# Patient Record
Sex: Male | Born: 1960
Health system: Southern US, Community
[De-identification: ages and names within clinical notes are randomized; demographics above are authoritative.]

## PROBLEM LIST (undated history)

## (undated) DIAGNOSIS — M1990 Unspecified osteoarthritis, unspecified site: Secondary | ICD-10-CM

## (undated) DIAGNOSIS — Z87891 Personal history of nicotine dependence: Secondary | ICD-10-CM

## (undated) DIAGNOSIS — R079 Chest pain, unspecified: Secondary | ICD-10-CM

## (undated) DIAGNOSIS — G8929 Other chronic pain: Secondary | ICD-10-CM

## (undated) DIAGNOSIS — G40909 Epilepsy, unspecified, not intractable, without status epilepticus: Secondary | ICD-10-CM

## (undated) DIAGNOSIS — M87059 Idiopathic aseptic necrosis of unspecified femur: Secondary | ICD-10-CM

## (undated) DIAGNOSIS — F1010 Alcohol abuse, uncomplicated: Secondary | ICD-10-CM

## (undated) DIAGNOSIS — R569 Unspecified convulsions: Secondary | ICD-10-CM

## (undated) DIAGNOSIS — J929 Pleural plaque without asbestos: Secondary | ICD-10-CM

## (undated) HISTORY — DX: Chest pain, unspecified: R07.9

## (undated) HISTORY — PX: OTHER SURGICAL HISTORY: SHX169

## (undated) HISTORY — DX: Idiopathic aseptic necrosis of unspecified femur: M87.059

## (undated) HISTORY — DX: Alcohol abuse, uncomplicated: F10.10

## (undated) HISTORY — DX: Pleural plaque without asbestos: J92.9

## (undated) HISTORY — DX: Epilepsy, unspecified, not intractable, without status epilepticus: G40.909

## (undated) HISTORY — DX: Personal history of nicotine dependence: Z87.891

## (undated) HISTORY — DX: Unspecified convulsions: R56.9

## (undated) HISTORY — DX: Unspecified osteoarthritis, unspecified site: M19.90

## (undated) HISTORY — PX: POLYPECTOMY: SHX149

## (undated) HISTORY — DX: Other chronic pain: G89.29

---

## 1997-09-17 ENCOUNTER — Encounter: Admission: RE | Admit: 1997-09-17 | Discharge: 1997-09-17 | Payer: Self-pay | Admitting: Hematology and Oncology

## 1998-05-29 ENCOUNTER — Encounter: Admission: RE | Admit: 1998-05-29 | Discharge: 1998-05-29 | Payer: Self-pay | Admitting: Hematology and Oncology

## 1998-12-09 ENCOUNTER — Encounter: Admission: RE | Admit: 1998-12-09 | Discharge: 1998-12-09 | Payer: Self-pay | Admitting: Internal Medicine

## 1999-05-28 ENCOUNTER — Encounter: Admission: RE | Admit: 1999-05-28 | Discharge: 1999-05-28 | Payer: Self-pay | Admitting: Hematology and Oncology

## 1999-12-17 ENCOUNTER — Encounter: Admission: RE | Admit: 1999-12-17 | Discharge: 1999-12-17 | Payer: Self-pay | Admitting: Hematology and Oncology

## 2000-05-16 ENCOUNTER — Encounter: Admission: RE | Admit: 2000-05-16 | Discharge: 2000-05-16 | Payer: Self-pay | Admitting: Internal Medicine

## 2000-11-01 ENCOUNTER — Encounter: Admission: RE | Admit: 2000-11-01 | Discharge: 2000-11-01 | Payer: Self-pay | Admitting: Internal Medicine

## 2000-11-10 ENCOUNTER — Encounter: Admission: RE | Admit: 2000-11-10 | Discharge: 2000-11-10 | Payer: Self-pay | Admitting: Internal Medicine

## 2001-02-06 ENCOUNTER — Emergency Department (HOSPITAL_COMMUNITY): Admission: EM | Admit: 2001-02-06 | Discharge: 2001-02-06 | Payer: Self-pay | Admitting: Emergency Medicine

## 2001-02-06 ENCOUNTER — Encounter: Payer: Self-pay | Admitting: *Deleted

## 2001-02-07 ENCOUNTER — Encounter: Admission: RE | Admit: 2001-02-07 | Discharge: 2001-02-07 | Payer: Self-pay

## 2001-02-20 ENCOUNTER — Ambulatory Visit (HOSPITAL_COMMUNITY): Admission: RE | Admit: 2001-02-20 | Discharge: 2001-02-20 | Payer: Self-pay | Admitting: Pulmonary Disease

## 2001-02-20 ENCOUNTER — Encounter: Payer: Self-pay | Admitting: Pulmonary Disease

## 2001-04-16 ENCOUNTER — Encounter: Payer: Self-pay | Admitting: Emergency Medicine

## 2001-04-16 ENCOUNTER — Emergency Department (HOSPITAL_COMMUNITY): Admission: EM | Admit: 2001-04-16 | Discharge: 2001-04-16 | Payer: Self-pay

## 2001-05-08 ENCOUNTER — Encounter: Admission: RE | Admit: 2001-05-08 | Discharge: 2001-05-08 | Payer: Self-pay | Admitting: *Deleted

## 2001-05-15 ENCOUNTER — Encounter: Admission: RE | Admit: 2001-05-15 | Discharge: 2001-05-15 | Payer: Self-pay

## 2001-06-07 ENCOUNTER — Inpatient Hospital Stay (HOSPITAL_COMMUNITY): Admission: EM | Admit: 2001-06-07 | Discharge: 2001-06-09 | Payer: Self-pay | Admitting: Emergency Medicine

## 2001-06-07 ENCOUNTER — Encounter: Payer: Self-pay | Admitting: Emergency Medicine

## 2001-06-12 ENCOUNTER — Encounter: Admission: RE | Admit: 2001-06-12 | Discharge: 2001-06-12 | Payer: Self-pay | Admitting: Internal Medicine

## 2001-07-26 ENCOUNTER — Encounter: Admission: RE | Admit: 2001-07-26 | Discharge: 2001-07-26 | Payer: Self-pay | Admitting: Internal Medicine

## 2001-07-27 ENCOUNTER — Ambulatory Visit (HOSPITAL_COMMUNITY): Admission: RE | Admit: 2001-07-27 | Discharge: 2001-07-27 | Payer: Self-pay | Admitting: *Deleted

## 2001-07-27 ENCOUNTER — Encounter: Payer: Self-pay | Admitting: *Deleted

## 2001-08-11 ENCOUNTER — Encounter: Admission: RE | Admit: 2001-08-11 | Discharge: 2001-08-11 | Payer: Self-pay | Admitting: Internal Medicine

## 2001-08-17 ENCOUNTER — Ambulatory Visit (HOSPITAL_COMMUNITY): Admission: RE | Admit: 2001-08-17 | Discharge: 2001-08-17 | Payer: Self-pay | Admitting: *Deleted

## 2001-08-18 ENCOUNTER — Encounter: Admission: RE | Admit: 2001-08-18 | Discharge: 2001-08-18 | Payer: Self-pay | Admitting: Internal Medicine

## 2001-10-04 ENCOUNTER — Encounter: Admission: RE | Admit: 2001-10-04 | Discharge: 2001-10-04 | Payer: Self-pay | Admitting: Internal Medicine

## 2001-10-13 ENCOUNTER — Encounter: Payer: Self-pay | Admitting: Emergency Medicine

## 2001-10-13 ENCOUNTER — Emergency Department (HOSPITAL_COMMUNITY): Admission: EM | Admit: 2001-10-13 | Discharge: 2001-10-13 | Payer: Self-pay | Admitting: Emergency Medicine

## 2001-11-27 ENCOUNTER — Encounter: Admission: RE | Admit: 2001-11-27 | Discharge: 2001-11-27 | Payer: Self-pay | Admitting: Internal Medicine

## 2002-01-15 ENCOUNTER — Emergency Department (HOSPITAL_COMMUNITY): Admission: EM | Admit: 2002-01-15 | Discharge: 2002-01-15 | Payer: Self-pay | Admitting: Emergency Medicine

## 2002-01-15 ENCOUNTER — Encounter: Payer: Self-pay | Admitting: Emergency Medicine

## 2002-03-15 ENCOUNTER — Encounter: Admission: RE | Admit: 2002-03-15 | Discharge: 2002-03-15 | Payer: Self-pay | Admitting: Internal Medicine

## 2002-03-21 ENCOUNTER — Ambulatory Visit (HOSPITAL_COMMUNITY): Admission: RE | Admit: 2002-03-21 | Discharge: 2002-03-21 | Payer: Self-pay | Admitting: Internal Medicine

## 2002-03-21 ENCOUNTER — Encounter: Payer: Self-pay | Admitting: Internal Medicine

## 2002-04-16 ENCOUNTER — Emergency Department (HOSPITAL_COMMUNITY): Admission: EM | Admit: 2002-04-16 | Discharge: 2002-04-16 | Payer: Self-pay | Admitting: Emergency Medicine

## 2002-06-01 ENCOUNTER — Encounter: Admission: RE | Admit: 2002-06-01 | Discharge: 2002-06-01 | Payer: Self-pay | Admitting: Internal Medicine

## 2002-08-17 ENCOUNTER — Encounter: Payer: Self-pay | Admitting: Internal Medicine

## 2002-08-17 ENCOUNTER — Encounter: Admission: RE | Admit: 2002-08-17 | Discharge: 2002-08-17 | Payer: Self-pay | Admitting: Internal Medicine

## 2002-08-17 ENCOUNTER — Ambulatory Visit (HOSPITAL_COMMUNITY): Admission: RE | Admit: 2002-08-17 | Discharge: 2002-08-17 | Payer: Self-pay | Admitting: Internal Medicine

## 2002-11-13 ENCOUNTER — Emergency Department (HOSPITAL_COMMUNITY): Admission: EM | Admit: 2002-11-13 | Discharge: 2002-11-13 | Payer: Self-pay | Admitting: Emergency Medicine

## 2002-11-13 ENCOUNTER — Encounter: Payer: Self-pay | Admitting: Emergency Medicine

## 2002-11-28 ENCOUNTER — Encounter: Admission: RE | Admit: 2002-11-28 | Discharge: 2002-11-28 | Payer: Self-pay | Admitting: Internal Medicine

## 2003-04-11 ENCOUNTER — Encounter: Admission: RE | Admit: 2003-04-11 | Discharge: 2003-04-11 | Payer: Self-pay | Admitting: Internal Medicine

## 2003-04-22 ENCOUNTER — Encounter: Admission: RE | Admit: 2003-04-22 | Discharge: 2003-04-22 | Payer: Self-pay | Admitting: Internal Medicine

## 2003-04-29 ENCOUNTER — Encounter: Admission: RE | Admit: 2003-04-29 | Discharge: 2003-04-29 | Payer: Self-pay | Admitting: Internal Medicine

## 2003-07-09 ENCOUNTER — Encounter: Admission: RE | Admit: 2003-07-09 | Discharge: 2003-07-09 | Payer: Self-pay | Admitting: Internal Medicine

## 2003-08-14 ENCOUNTER — Encounter: Admission: RE | Admit: 2003-08-14 | Discharge: 2003-08-14 | Payer: Self-pay | Admitting: Internal Medicine

## 2003-11-04 ENCOUNTER — Ambulatory Visit (HOSPITAL_COMMUNITY): Admission: RE | Admit: 2003-11-04 | Discharge: 2003-11-04 | Payer: Self-pay | Admitting: Internal Medicine

## 2003-11-04 ENCOUNTER — Encounter: Admission: RE | Admit: 2003-11-04 | Discharge: 2003-11-04 | Payer: Self-pay | Admitting: Internal Medicine

## 2004-05-28 ENCOUNTER — Ambulatory Visit (HOSPITAL_COMMUNITY): Admission: RE | Admit: 2004-05-28 | Discharge: 2004-05-28 | Payer: Self-pay | Admitting: Internal Medicine

## 2004-05-28 ENCOUNTER — Ambulatory Visit: Payer: Self-pay | Admitting: Internal Medicine

## 2004-06-08 ENCOUNTER — Ambulatory Visit: Payer: Self-pay | Admitting: Internal Medicine

## 2004-09-28 ENCOUNTER — Ambulatory Visit: Payer: Self-pay | Admitting: Internal Medicine

## 2005-03-08 DIAGNOSIS — M87059 Idiopathic aseptic necrosis of unspecified femur: Secondary | ICD-10-CM

## 2005-03-08 HISTORY — DX: Idiopathic aseptic necrosis of unspecified femur: M87.059

## 2005-06-15 ENCOUNTER — Ambulatory Visit: Payer: Self-pay | Admitting: Hospitalist

## 2005-10-12 ENCOUNTER — Inpatient Hospital Stay (HOSPITAL_COMMUNITY): Admission: RE | Admit: 2005-10-12 | Discharge: 2005-10-15 | Payer: Self-pay | Admitting: Orthopedic Surgery

## 2006-02-25 DIAGNOSIS — R079 Chest pain, unspecified: Secondary | ICD-10-CM | POA: Insufficient documentation

## 2006-02-25 DIAGNOSIS — R918 Other nonspecific abnormal finding of lung field: Secondary | ICD-10-CM | POA: Insufficient documentation

## 2006-02-25 DIAGNOSIS — R569 Unspecified convulsions: Secondary | ICD-10-CM | POA: Insufficient documentation

## 2006-02-25 DIAGNOSIS — M87051 Idiopathic aseptic necrosis of right femur: Secondary | ICD-10-CM | POA: Insufficient documentation

## 2006-03-22 ENCOUNTER — Ambulatory Visit: Payer: Self-pay | Admitting: Hospitalist

## 2006-03-22 ENCOUNTER — Ambulatory Visit (HOSPITAL_COMMUNITY): Admission: RE | Admit: 2006-03-22 | Discharge: 2006-03-22 | Payer: Self-pay | Admitting: Hospitalist

## 2006-03-31 ENCOUNTER — Telehealth (INDEPENDENT_AMBULATORY_CARE_PROVIDER_SITE_OTHER): Payer: Self-pay | Admitting: *Deleted

## 2006-11-29 ENCOUNTER — Telehealth (INDEPENDENT_AMBULATORY_CARE_PROVIDER_SITE_OTHER): Payer: Self-pay | Admitting: *Deleted

## 2007-01-02 ENCOUNTER — Ambulatory Visit: Payer: Self-pay | Admitting: *Deleted

## 2007-01-02 ENCOUNTER — Encounter (INDEPENDENT_AMBULATORY_CARE_PROVIDER_SITE_OTHER): Payer: Self-pay | Admitting: *Deleted

## 2007-01-02 LAB — CONVERTED CEMR LAB
ALT: 23 units/L (ref 0–53)
AST: 23 units/L (ref 0–37)
Basophils Absolute: 0.1 10*3/uL (ref 0.0–0.1)
Basophils Relative: 1 % (ref 0–1)
Creatinine, Ser: 0.87 mg/dL (ref 0.40–1.50)
Eosinophils Relative: 9 % — ABNORMAL HIGH (ref 0–5)
HCT: 40.5 % (ref 39.0–52.0)
Hemoglobin: 13.8 g/dL (ref 13.0–17.0)
MCHC: 34.1 g/dL (ref 30.0–36.0)
Monocytes Absolute: 0.4 10*3/uL (ref 0.2–0.7)
RDW: 13.9 % (ref 11.5–14.0)
Total Bilirubin: 0.3 mg/dL (ref 0.3–1.2)

## 2007-01-03 ENCOUNTER — Encounter (INDEPENDENT_AMBULATORY_CARE_PROVIDER_SITE_OTHER): Payer: Self-pay | Admitting: *Deleted

## 2007-09-11 ENCOUNTER — Telehealth: Payer: Self-pay | Admitting: *Deleted

## 2007-09-12 ENCOUNTER — Encounter: Payer: Self-pay | Admitting: Internal Medicine

## 2007-09-12 ENCOUNTER — Ambulatory Visit: Payer: Self-pay | Admitting: Internal Medicine

## 2007-09-12 LAB — CONVERTED CEMR LAB
AST: 23 units/L (ref 0–37)
BUN: 20 mg/dL (ref 6–23)
Basophils Relative: 2 % — ABNORMAL HIGH (ref 0–1)
Calcium: 8.8 mg/dL (ref 8.4–10.5)
Chloride: 105 meq/L (ref 96–112)
Creatinine, Ser: 0.92 mg/dL (ref 0.40–1.50)
Eosinophils Absolute: 0.5 10*3/uL (ref 0.0–0.7)
Glucose, Bld: 83 mg/dL (ref 70–99)
HDL: 49 mg/dL (ref >39)
Hemoglobin: 13.3 g/dL (ref 13.0–17.0)
MCHC: 33.2 g/dL (ref 30.0–36.0)
MCV: 85.9 fL (ref 78.0–100.0)
Monocytes Absolute: 0.3 10*3/uL (ref 0.1–1.0)
Monocytes Relative: 6 % (ref 3–12)
Neutro Abs: 2.8 10*3/uL (ref 1.7–7.7)
Phenytoin Lvl: 11.7 ug/mL (ref 10.0–20.0)
RBC: 4.67 M/uL (ref 4.22–5.81)
Total CHOL/HDL Ratio: 3.1
Triglycerides: 139 mg/dL (ref <150)

## 2008-05-13 ENCOUNTER — Ambulatory Visit: Payer: Self-pay | Admitting: Internal Medicine

## 2008-05-13 ENCOUNTER — Encounter: Payer: Self-pay | Admitting: Internal Medicine

## 2008-05-13 LAB — CONVERTED CEMR LAB
ALT: 17 units/L (ref 0–53)
AST: 15 units/L (ref 0–37)
Alkaline Phosphatase: 112 units/L (ref 39–117)
BUN: 16 mg/dL (ref 6–23)
Creatinine, Ser: 0.77 mg/dL (ref 0.40–1.50)
HCT: 41.3 % (ref 39.0–52.0)
MCHC: 33.4 g/dL (ref 30.0–36.0)
MCV: 86.4 fL (ref 78.0–100.0)
Platelets: 233 10*3/uL (ref 150–400)
RDW: 14.5 % (ref 11.5–15.5)
Total Bilirubin: 0.2 mg/dL — ABNORMAL LOW (ref 0.3–1.2)

## 2009-03-31 ENCOUNTER — Ambulatory Visit: Payer: Self-pay | Admitting: Internal Medicine

## 2009-06-03 ENCOUNTER — Emergency Department (HOSPITAL_COMMUNITY): Admission: EM | Admit: 2009-06-03 | Discharge: 2009-06-03 | Payer: Self-pay | Admitting: Emergency Medicine

## 2010-01-23 ENCOUNTER — Telehealth: Payer: Self-pay | Admitting: Internal Medicine

## 2010-04-09 NOTE — Progress Notes (Signed)
Summary: med refillgp  Phone Note Refill Request Message from:  Fax from Pharmacy on January 23, 2010 4:19 PM  Refills Requested: Medication #1:  DILANTIN 100 MG CAPS Take 2 capsules by mouth two times a day [BMN]   Last Refilled: 12/12/2009 Last appt. Jan 24,2011.   Method Requested: Electronic Initial call taken by: Chinita Pester RN,  January 23, 2010 4:20 PM  Follow-up for Phone Call       Follow-up by: Blondell Reveal MD,  January 23, 2010 7:00 PM    Prescriptions: DILANTIN 100 MG CAPS (PHENYTOIN SODIUM EXTENDED) Take 2 capsules by mouth two times a day Brand medically necessary #120 Capsule x 11   Entered and Authorized by:   Blondell Reveal MD   Signed by:   Blondell Reveal MD on 01/23/2010   Method used:   Electronically to        [facility] [phone]* (retail)       [address]       [address]       [place], [place]  [address]       Ph: [phone]       Fax: [phone]   RxID:   [id number]

## 2010-04-09 NOTE — Assessment & Plan Note (Signed)
Summary: CHECKUP/SB.   Vital Signs:  Patient profile:   50 year old male Height:      67 inches Weight:      140.2 pounds BMI:     22.04 Temp:     97.1 degrees F oral Pulse rate:   64 / minute BP sitting:   103 / 66  (right arm)  Vitals Entered By: Filomena Jungling NT II (March 31, 2009 11:09 AM) CC: need refills on media Nutritional Status BMI of 19 -24 = normal  Have you ever been in a relationship where you felt threatened, hurt or afraid?No   Does patient need assistance? Functional Status Self care Ambulation Normal   Primary Care Provider:  Blondell Reveal MD  CC:  need refills on media.  History of Present Illness: 50 yr old gentleman with PMH as mentioned in the EMR comes to the office for a follow up.  Complaints of pain in his right hip, sometimes radiates to this upper thigh, s/p right hip replacement, seen by his ortho few months ago, who evaluated him and said that his joint looked fine. Patient is requesting a pain medicine.  Last seizure was many years ago (prior to 2007).      Preventive Screening-Counseling & Management  Alcohol-Tobacco     Smoking Status: quit     Year Quit: 2005     Passive Smoke Exposure: no  Caffeine-Diet-Exercise     Does Patient Exercise: yes     Times/week: 3  Problems Prior to Update: 1)  Seizure Disorder  (ICD-780.39) 2)  Abnormal Chest Xray  (ICD-793.1) 3)  Hip Pain  (ICD-719.45) 4)  Avascular Necrosis  (ICD-733.40) 5)  Chest Pain  (ICD-786.50) 6)  Encounter For Therapeutic Drug Monitoring  (ICD-V58.83)  Medications Prior to Update: 1)  Dilantin 100 Mg Caps (Phenytoin Sodium Extended) .... Take 2 Capsules By Mouth Two Times A Day  Current Medications (verified): 1)  Dilantin 100 Mg Caps (Phenytoin Sodium Extended) .... Take 2 Capsules By Mouth Two Times A Day  Allergies (verified): No Known Drug Allergies  Past History:  Family History: Last updated: October 01, 2007 father died  of heart attack at age 35. Mother  doesnt have any major health problems.  Social History: Last updated: 03/22/2006 Occupation:  Custodian Single:  Has one sex partner uses condoms Former Smoker: Alcohol use-no but former  Risk Factors: Exercise: yes (03/31/2009)  Risk Factors: Smoking Status: quit (03/31/2009) Passive Smoke Exposure: no (03/31/2009)  Past Medical History: Reviewed history from 03/22/2006 and no changes required. Seizure disorder:  Started at age 35; last seizure Sept 2007 (2/2 stopping dilantin) Tobacco abuse, hx of:  stopped in 2005 Chest pain, chronic:  Normal stress test 2003 Pleural plaques, chest Right hip pain:  s/p right hip replacement Aug 2007 Dr. Charlann Boxer        Avascular necrosis:  Right hip Paresthesis, right leg Alcohol abuse,  history of:  stopped 2000 prior to this 12-pack for 25 years  Past Surgical History: Reviewed history from 02/25/2006 and no changes required. Right Hip replacement Aug 2007:  Dr. Charlann Boxer  Family History: Reviewed history from 2007-10-01 and no changes required. father died  of heart attack at age 23. Mother doesnt have any major health problems.  Social History: Reviewed history from 03/22/2006 and no changes required. Occupation:  Custodian Single:  Has one sex partner uses condoms Former Smoker: Alcohol use-no but former  Review of Systems      See HPI  Physical Exam  General:  Well-developed,well-nourished,in no acute distress; alert,appropriate and cooperative throughout examination Head:  Normocephalic and atraumatic without obvious abnormalities. No apparent alopecia or balding. Neck:  No deformities, masses, or tenderness noted. Lungs:  Normal respiratory effort, chest expands symmetrically. Lungs are clear to auscultation, no crackles or wheezes. Heart:  Normal rate and regular rhythm. S1 and S2 normal without gallop, murmur, click, rub or other extra sounds. Abdomen:  soft, non-tender, normal bowel sounds, no distention, no masses, no  guarding, and no rigidity.   Msk:  no tenderness at the right hip. limping because of the pain. pain with all the movement of his right hip especially flexion and extension. no audible clicks upon movement. Pulses:  R radial normal.   Extremities:  no pedal edema Neurologic:  alert & oriented X3 and strength normal in all extremities.     Impression & Recommendations:  Problem # 1:  SEIZURE DISORDER (ICD-780.39)  Continue current management. He has seen neuroloist long time back who recommended medications for as ? life long. His updated medication list for this problem includes:    Dilantin 100 Mg Caps (Phenytoin sodium extended) .Marland Kitchen... Take 2 capsules by mouth two times a day   His updated medication list for this problem includes:    Dilantin 100 Mg Caps (Phenytoin sodium extended) .Marland Kitchen... Take 2 capsules by mouth two times a day    Problem # 2:  HIP PAIN (ICD-719.45)  s/p right hip replacement. seen and evaluated by his ortho few months ago who x-ray'ed him. Apparently it seems his joint was holding ok. Given his severe pain today, will give toradol 30 mg IM and also a prescription for tramadol 50 mg. Recommended follow up with ortho as  they recommended. His updated medication list for this problem includes:    Tramadol Hcl 50 Mg Tabs (Tramadol hcl) .Marland Kitchen... Take 1 tablet by mouth two times a day as needed for pain.Orders: Admin of Therapeutic Inj  intramuscular or subcutaneous (98119)   His updated medication list for this problem includes:    Tramadol Hcl 50 Mg Tabs (Tramadol hcl) .Marland Kitchen... Take 1 tablet by mouth two times a day as needed for pain.   Orders: Admin of Therapeutic Inj  intramuscular or subcutaneous (14782)    Complete Medication List: 1)  Dilantin 100 Mg Caps (Phenytoin sodium extended) .... Take 2 capsules by mouth two times a day 2)  Tramadol Hcl 50 Mg Tabs (Tramadol hcl) .... Take 1 tablet by mouth two times a day as needed for pain. Admin of Therapeutic Inj   intramuscular or subcutaneous (95621)  Patient Instructions: 1)  Please schedule a follow-up appointment in 6 months. Prescriptions: DILANTIN 100 MG CAPS (PHENYTOIN SODIUM EXTENDED) Take 2 capsules by mouth two times a day Brand medically necessary #120 Capsule x 6   Entered and Authorized by:   Blondell Reveal MD   Signed by:   Blondell Reveal MD on 03/31/2009   Method used:   Print then Give to Patient   RxID:   [id number] TRAMADOL HCL 50 MG TABS (TRAMADOL HCL) Take 1 tablet by mouth two times a day as needed for pain.  #60 x 0   Entered and Authorized by:   Blondell Reveal MD   Signed by:   Blondell Reveal MD on 03/31/2009   Method used:   Print then Give to Patient   RxID:   [id number]   Prevention & Chronic Care Immunizations   Influenza vaccine: Not documented   Influenza vaccine deferral: Deferred  (  03/31/2009)    Tetanus booster: Not documented    Pneumococcal vaccine: Not documented  Other Screening   PSA: Not documented   PSA action/deferral: Discussion deferred  (03/31/2009)   Smoking status: quit  (03/31/2009)  Lipids   Total Cholesterol: 153  (09/12/2007)   LDL: 76  (09/12/2007)   LDL Direct: Not documented   HDL: 49  (09/12/2007)   Triglycerides: 139  (09/12/2007)   Nursing Instructions: Please give Toradol 30 mg IM X 1    Impression & Recommendations:  His updated medication list for this problem includes:    Dilantin 100 Mg Caps (Phenytoin sodium extended) .Marland Kitchen... Take 2 capsules by mouth two times a day   His updated medication list for this problem includes:    Tramadol Hcl 50 Mg Tabs (Tramadol hcl) .Marland Kitchen... Take 1 tablet by mouth two times a day as needed for pain.    Orders: Admin of Therapeutic Inj  intramuscular or subcutaneous (04540)     Complete Medication List: 1)  Dilantin 100 Mg Caps (Phenytoin sodium extended) .... Take 2 capsules by mouth two times a day 2)  Tramadol Hcl 50 Mg Tabs (Tramadol hcl) .... Take 1 tablet by mouth  two times a day as needed for pain.   Medication Administration  Injection # 1:    Medication: Ketorolac-Toradol 30mg     Diagnosis: HIP PAIN (ICD-719.45)    Route: IM    Site: R deltoid    Exp Date: 10/07/2010    Lot #: [id number]    Mfr: Novaplus    Comments: Pain level #5 - right hip.    Patient tolerated injection without complications    Given by: Chinita Pester RN (March 31, 2009 11:45 AM)  Orders Added: 1)  Est. Patient Level III [78295] 2)  Admin of Therapeutic Inj  intramuscular or subcutaneous [62130]

## 2010-05-18 ENCOUNTER — Encounter: Payer: Self-pay | Admitting: Ophthalmology

## 2010-05-31 LAB — POCT I-STAT, CHEM 8
BUN: 11 mg/dL (ref 6–23)
Creatinine, Ser: 0.9 mg/dL (ref 0.4–1.5)
Potassium: 3.7 mEq/L (ref 3.5–5.1)
Sodium: 140 mEq/L (ref 135–145)

## 2010-05-31 LAB — PHENYTOIN LEVEL, TOTAL: Phenytoin Lvl: 2.6 ug/mL — ABNORMAL LOW (ref 10.0–20.0)

## 2010-06-08 ENCOUNTER — Emergency Department (HOSPITAL_COMMUNITY)
Admission: EM | Admit: 2010-06-08 | Discharge: 2010-06-08 | Disposition: A | Discharge status: Home / Self Care | Payer: Self-pay | Attending: Emergency Medicine | Admitting: Emergency Medicine

## 2010-06-08 DIAGNOSIS — R296 Repeated falls: Secondary | ICD-10-CM | POA: Insufficient documentation

## 2010-06-08 DIAGNOSIS — G40909 Epilepsy, unspecified, not intractable, without status epilepticus: Secondary | ICD-10-CM | POA: Insufficient documentation

## 2010-06-08 DIAGNOSIS — R404 Transient alteration of awareness: Secondary | ICD-10-CM | POA: Insufficient documentation

## 2010-06-08 DIAGNOSIS — F29 Unspecified psychosis not due to a substance or known physiological condition: Secondary | ICD-10-CM | POA: Insufficient documentation

## 2010-06-08 DIAGNOSIS — S0083XA Contusion of other part of head, initial encounter: Secondary | ICD-10-CM | POA: Insufficient documentation

## 2010-06-08 DIAGNOSIS — S0003XA Contusion of scalp, initial encounter: Secondary | ICD-10-CM | POA: Insufficient documentation

## 2010-06-08 DIAGNOSIS — Y929 Unspecified place or not applicable: Secondary | ICD-10-CM | POA: Insufficient documentation

## 2010-06-08 LAB — POCT I-STAT, CHEM 8
Chloride: 102 mEq/L (ref 96–112)
Creatinine, Ser: 1 mg/dL (ref 0.4–1.5)
Glucose, Bld: 112 mg/dL — ABNORMAL HIGH (ref 70–99)
HCT: 43 % (ref 39.0–52.0)
Potassium: 3.6 mEq/L (ref 3.5–5.1)
Sodium: 141 mEq/L (ref 135–145)

## 2010-07-24 NOTE — Discharge Summary (Signed)
NAME:  Bill [name], REICHELT              ACCOUNT NO.:  [id number]   MEDICAL RECORD NO.:  [id number]          PATIENT TYPE:  INP   LOCATION:  1511                         FACILITY:  [facility]   PHYSICIAN:  Madlyn Frankel. Charlann Boxer, M.D.  DATE OF BIRTH:  20-Apr-1960   DATE OF ADMISSION:  10/12/2005  DATE OF DISCHARGE:  10/15/2005                                 DISCHARGE SUMMARY   ADMITTING DIAGNOSES:  1. Avascular necrosis of the right hip.  2. History of seizure disorder.   DISCHARGE DIAGNOSIS:  1. Avascular necrosis of the right hip.  2. History of seizure disorder.  3. Mild postoperative anemia.   OPERATION:  On October 12, 2005, the patient underwent right total hip  replacement arthroplasty utilizing DePuy hip system.  Jenne Campus PA-C  assisted.   BRIEF HISTORY:  A 50 year old gentleman with pain into his right hip.  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  X-ray showed avascular necrosis of femoral  head with collapse of the femoral head itself.  After much discussion,  including the risks and benefits of surgery, it was decided he would benefit  from surgical intervention.  He agreed to the above procedure.   COURSE IN THE HOSPITAL:  The patient tolerated the surgical procedure quite  well, worked diligently with physical therapy.  Wound remained clean and dry  without any evidence of infection.   It was noted he had a mild postoperative kalemia with a potassium at 3.4.  We gave him several doses of KCl this brought up.  Also for his anemia we  gave him Trinsicon.   He was placed on Lovenox postoperatively for the prevention of DVT and will  continue with that for 10 days and then go to enteric-coated aspirin.  On  the day of discharge wound was clean and dry.  Neurovascularly he was intact  to the operative extremity.  He was asymptomatic as far  as anemia is  concerned, had no complications in the hospital.  Laboratory values in the  hospital, hematologically, showed a CBC preop which was completely within  normal limits.  Hemoglobin was 13.5 with hematocrit of 39.6.  Final  hemoglobin was 8.6 and hematocrit 25.4.  Blood chemistries again showed  preoperative normal potassium at 4.1, and his final potassium was 3.4.  Urinalysis was negative for urinary tract infection.  Electrocardiogram  showed normal sinus rhythm.  Chest x-ray showed no active cardiopulmonary  disease.   CONDITION ON DISCHARGE:  Improved, stable.   PLAN:  The patient is discharged to his home, in the care of his family.  He  may continue with physical therapy at home, through [place].  He was given  Vicodin for discomfort, Robaxin as a muscle relaxant, Lovenox 40 mg subcu  daily, stop on October 12, 2005, and then go to one enteric-coated aspirin.  Trinsicon b.i.d. for a month is given.  He is return to see Dr. Charlann Boxer 2 weeks  after  date of surgery and use dry dressing to the hip on an as-needed basis.  He is encouraged to call should he have any problems or questions.      [name].      Madlyn Frankel Charlann Boxer, M.D.  Electronically Signed    DLU/MEDQ  D:  11/03/2005  T:  11/03/2005  Job:  [id number]

## 2010-07-24 NOTE — Op Note (Signed)
NAME:  Bill [name], Bill [name]              ACCOUNT NO.:  [id number]   MEDICAL RECORD NO.:  [id number]          PATIENT TYPE:  INP   LOCATION:  0003                         FACILITY:  [facility]   PHYSICIAN:  Madlyn Frankel. Charlann Boxer, M.D.  DATE OF BIRTH:  12-06-1960   DATE OF PROCEDURE:  10/12/2005  DATE OF DISCHARGE:                                 OPERATIVE REPORT   PREOPERATIVE DIAGNOSIS:  Right hip avascular necrosis.   POSTOPERATIVE DIAGNOSIS:  Right hip avascular necrosis.   PROCEDURE:  Right total hip placement.   COMPONENTS:  A DePuy hip system, size 52 ASR cup with a Tri-Lock 15 standard  stem, +2 adapter and a 46 ASR ball.   SURGEON:  Madlyn Frankel. Charlann Boxer, M.D.   ASSISTANT:  Cherly Beach, P.A.   ANESTHESIA:  General.   SPECIMEN:  None.   DRAINS:  None.   COMPLICATIONS:  None.   INDICATIONS FOR PROCEDURE:  Mr. Doughtie is a very pleasant 50 year old  gentleman who presented to the office with a 2- to 3-year history of right  hip pain.  He was not sure exactly what was going on, but radiographs  revealed significant degenerative joint disease with collapse.   After reviewing on his discomfort and quality-of-life issues, he wished to  proceed with a right total hip replacement.  Risks and benefits of  infection, DVT, nerve and vascular injury were all discussed; component  issues were discussed as well.  Consent obtained.   PROCEDURE IN DETAIL:  The patient was brought to the operative theater.  Once adequate anesthesia and preoperative antibiotics, 1 g of Ancef, were  administered, the patient was positioned in the left lateral decubitus  position with the right side up.  The right lower extremity was then prepped  and draped in sterile fashion.   A posterolateral incision was made for a posterior approach to the hip.  The  iliotibial band and gluteus fascia were incised in line with the incision.  The short external rotators were taken down separate from the posterior  capsule.  An  L capsulotomy was then created.  Debridement of synovium was  carried out.  The patient was noted to have a very hypervascular synovitis  due to the AVN.   The hip was dislocated and a neck osteotomy was made based off anatomic  landmarks and preoperative templating in reference to the tip of the  trochanter.  The patient was noted to have a hip center that was above the  tip of the trochanter.   Following removal of the femoral head and identification of advanced  degenerative changes with severe osteophytes and flattening of the femoral  head, attention was directed first to the femur.  Femoral exposure was  obtained in routine fashion with the Tri-Lock system.  I used the box  osteotome followed by a straight reamer.  I then broached.  I broached all  way up to 13.8, which gave me an initial good fit and then I dialed in 25  degrees of anteversion.  With this, I went ahead and prepared the  acetabulum.  Acetabular exposure was attained  in routine fashion.  Labrectomy carried out.  The patient was noted to have a very osteophytic  medial wall of the acetabulum with complete obliteration of the normal  acetabulum.  I reamed down with a 45 reamer down to the medial wall to re-  create the normal anatomy.  I then sequentially reamed up by 2's to a 51-mm,  which gave me excellent bony purchase and good coverage.  A little bit of  undercoverage on the superior lateral aspect, nonetheless, good coverage in  the columns and posteriorly.   With this, I did debride a few cysts, then packed them with bone graft, then  impacted a 52 ASR cup.  This went down to level of the reaming with an  excellent fit 35-40 degrees of abduction and 20 degrees of anteversion, it  was beneath the anterior wall, or anterior column.  With this, attention was  redirected back to the femur, where initially I trialed with 13.8 broach.  I  initially trialed a high-offset stem, which was very stable, but I felt if I   lateralized them too much would put tension on the iliotibial band.   With this, I went ahead and trialed with a standard neck.  With this, the  hip stayed very stable again.   I did trial with a +2 adapter initially and then with a +5; I did this  because the leg lengths appeared to be even shorter; however, with extension  and shucking, there was a millimeter of shuck only.   Please note that when I did dislocated the hip at this point and trialed the  right femoral broach, that there was some toggle and torsion.  Because of  this, I went into 15 stem.  Using the 15 broach, I was able to impact this  to the level with the center of the head just above the tip of the  trochanter, which is what I wanted.   With this, I trialed again with a +2 adapter, again felt that the soft  tissues were very appropriately tensioned with only a millimeter of shuck  and the hip was very stable with the standard stem of the +2 adapter.  With  this, the final 15 stem was opened and impacted to the level of where the  broach was, which was slightly proud compared to what the 13.8 was.  With  this, I used the +2 adapter, trial reduction again and I was very happy with  this and thus I placed a 46 ASR ball with +2 adapter.   This was impacted onto a cleaned and dried trunnion and the hip reduced.   The wound was copiously irrigated throughout the case and again at this  point.  I reapproximated the posterior capsule to the superior leaflet.  I  then did not use a Hemovac, but reapproximated the rest of layers.  4-0  Monocryl was then used on the skin.  The hip was cleaned, dried and dressed  sterilely with Steri-Strips, dressing sponges and tape.  The patient was  brought to the recovery room in stable and extubated.      Madlyn Frankel Charlann Boxer, M.D.  Electronically Signed     MDO/MEDQ  D:  10/12/2005  T:  10/12/2005  Job:  [id number]

## 2010-07-24 NOTE — H&P (Signed)
NAME:  Bill [name], SCHIELE              ACCOUNT NO.:  [id number]   MEDICAL RECORD NO.:  [id number]          PATIENT TYPE:  INP   LOCATION:  NA                           FACILITY:  [facility]   PHYSICIAN:  Madlyn Frankel. Charlann Boxer, M.D.  DATE OF BIRTH:  [birth year]   DATE OF ADMISSION:  10/12/2005  DATE OF DISCHARGE:                                HISTORY & PHYSICAL   CHIEF COMPLAINT:  Pain in my right hip.   HISTORY OF PRESENT ILLNESS:  This 50 year old black male seen by Bill [name] for  continuing problems concerning his right hip.  He was seen by Bill [name] originally  in May 2007, with a 2 to 3-year history of pain into the right hip.  He has  difficulty with getting about, walking with a decided limp to the right.  He  has groin pain radiating down the thigh.  He works at [name]  here  in [place].  It is a very physical job and he is finding more and more  difficulty in doing his day to day activities.  X-rays have shown avascular  necrosis of the femoral head with collapse.  The patient has a history in  the past of marked intake of ETOH.  He does not partake of any now.  It is  felt that perhaps that has a causative factor for this avascular necrosis.   PAST MEDICAL HISTORY:  Otherwise this gentleman has been in relatively good  health throughout his lifetime.  He does have a seizure disorder and takes  Dilantin for it.  He is unsure of the milligrams.  He is treated at the [name] for that.  His last seizure was in October 2006.   FAMILY HISTORY:  Negative.   He has had no previous surgeries.   SOCIAL HISTORY:  The patient is single.  He is a Dietitian.  He has no  intake of alcohol or tobacco products.  He has really no one to care for him  at home after the surgery but he will try to see if his brother can be with  him.   REVIEW OF SYSTEMS:  CNS:  The patient has a seizure disorder as mentioned  above that is not active as long as he is on the medication.  CARDIOVASCULAR:   No chest pain, no angina, no orthopnea.  RESPIRATORY:  No  productive cough, no hemoptysis, no shortness of breath.  GASTROINTESTINAL:  No nausea, vomiting, melena, bloody stools.  GENITOURINARY:  No discharge,  dysuria, hematuria.  MUSCULOSKELETAL:  Primarily in present illness.   PHYSICAL EXAMINATION:  GENERAL:  Alert and cooperative, friendly, fully  alert and oriented, 50 year old black male.  VITAL SIGNS:  Blood pressure  106/64, pulse 60, respirations 12.  HEENT:  Normocephalic.  PERLA.  EOMs intact.  Oropharynx is clear.  CHEST:  Clear to auscultation.  No rhonchi.  No rales.  HEART:  Regular rate and rhythm.  No murmurs are heard.  ABDOMEN:  Soft, nontender.  Liver spleen not felt.  GENITALIA/RECTAL:  Not done.  Not pertinent to present illness.  EXTREMITIES:  The patient has painful range of motion of the right hip, more  specifically on internal external rotation.   ADMITTING DIAGNOSES:  1.  Avascular necrosis of the right hip.  2.  Seizure disorder.   PLAN:  The patient will undergo right total hip replacement arthroplasty.  The risks and benefits of surgery have been described to him by Dr. Charlann Boxer.      [name].      Madlyn Frankel Charlann Boxer, M.D.  Electronically Signed    DLU/MEDQ  D:  10/08/2005  T:  10/08/2005  Job:  [id number]   cc:   Madlyn Frankel Charlann Boxer, M.D.  Fax: [phone]

## 2010-07-24 NOTE — Discharge Summary (Signed)
[place]  Patient:    Bill [name], Bill [name] Visit Number: [id number] MRN: [id number]          Service Type: MED Location: [phone] Attending Physician:  Farley Ly Dictated by:   Jennette Kettle, M.D. Admit Date:  06/07/2001 Discharge Date: 06/09/2001                             Discharge Summary  DISCHARGE DIAGNOSES: 1. Atypical chest pain (Negative cardiac enzymes and no evidence of EKG    changes).    a. The patient is scheduled for outpatient Cardiolite study with [facility] on the day of discharge. 2. Abnormal chest x-ray on June 07, 2001 revealing reticular nodular changes    which are consistent with prior chest x-ray in December.  However,    follow-up of his PA and lateral is recommended in 1-2 months. 3. History of abnormal chest CT in December 2002 revealing bilateral pleural    plaques as well as paraseptal emphysema.  There is a question as to    asbestos related pleural plaques. 4. History of epilepsy since age 15 (on Dilantin). 5. History of alcohol (quit in 1994). 6. History of tobacco use of 1 to 1 1/2 packs per day (quit January 2003). 7. Chronic obstructive pulmonary disease changes on CT scan in December 2002. 8. Positive marijuana use on urine drug screen during current hospitalization.  DISCHARGE MEDICATIONS: 1. Aspirin 81 mg p.o. q.d. 2. Dilantin 200 mg p.o. b.i.d. 3. Tylenol 325 mg 1-2 tablets q.4h. p.r.n. pain and fever. 4. The patient also recommended that he may take Mylanta or over-the-counter    Pepcid as needed for possible heartburn related to his atypical chest pain.  FOLLOW-UP: 1. To follow-up with outpatient Cardiolite study on June 09, 2001, the day of    discharge with [facility] at 1:00 pm. Results of this Cardiolite    were to be sent to the Internal Medicine Outpatient Clinic, C/O Dr. Alfonse Alpers. 2. The patient has a follow-up appointment at the Internal Medicine  Outpatient    Clinic with his new primary care doctor, Dr. Alfonse Alpers, previously scheduled    for Wednesday, Jul 26, 2001 at 2:55 pm. 3. The patient has a follow-up chest x-ray, PA and lateral, scheduled for    Thursday, Jul 27, 2001 at 11:00 am for further evaluation of the    possible abnormal chest x-ray on current hospital stay.  PROCEDURES/DIAGNOSTIC STUDIES: A 2-D, 2-view chest x-ray was performed on June 07, 2001, revealing the lungs were clear without acute disease. There is a probable confluence of shadows between the 7th anterior rib and 10th posterior rib.  In addition, there is possible healing rib fracture at the 8th lateral rib. There was no evidence of pneumonia. A follow-up x-ray in 1-2 months was recommended regarding the possible confluence of shadows in the right lower base.  HISTORY OF PRESENT ILLNESS: Briefly, Bill [name] is a 50 year old African-American male with a history of epilepsy since age 76, who presented on June 07, 2001 with a 1 week history of left sided chest pain with occasional radiation to the left arm and back. It was not associate with nausea, vomiting, or diaphoresis. The patient states that the chest pain usually occurred more during the day and was relieved at night. The patient had no history of trauma or weight lifting. The patient denied any cocaine  use or history of gastroesophageal reflux or anxiety. The pain was nonpleuritic in nature. The patient denied any fevers, chills, shortness of breath with the chest pain. Of note, the patient did have a similar episode approximately one month prior and was seen in the emergency room for right sided chest pains at that time. The patient does have a significant tobacco history of 25+ years of smoking but quit in January of 2003 and the patient does have a family history positive for his father deceased in his mid 52s secondary to a myocardial infarction. The patient did have normal fasting lipid profiles  as an outpatient in October of 2002 and has no history of diabetes or hypertension. The patient denied any recent seizures stating that his last seizure was approximately 2-3 years ago. The patient has been compliant with his medications.  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 There was no history of asbestos exposure that he knows of. In fact, the patient states "what is asbestos"?  The patient did not grow up in less than substandard housing. The patient currently lives in [place].  He is not married and has no children.  FAMILY HISTORY: Significant for a father deceased in his 13s secondary to a myocardial infarction.  DIAGNOSTIC STUDIES: Initially the patients EKG revealed normal sinus rhythm with no ST or T wave abnormalities. The patients chest x-ray is as above.  LABORATORY STUDIES: The patient did have a Dilantin level on admission that was within normal limits at 19.7. The patients urine drug screen was positive for marijuana. Otherwise, negative. The patients lipase was within normal limits at 20. The patients initial electrolytes revealed a sodium of 139, potassium 3.7, chloride 104, bicarb 29, BUN 12, creatinine 0.8, glucose 82, calcium 8.7, bilirubin 0.4, alkaphos 95, AST 21, ALT 24, protein 6.7, albumin 3.4. The patients CBC revealed a white count of 4.5, hemoglobin 13.6, platelets 220. The patients initial cardiac enzymes were CK 326, 288, and 229. The patients CKMB were 3.1, 2.4, and 1.7. The patients relative index was 1.0, 0.8, and 0.7.  The patients troponin values were 0.01, less than 0.01, and 0.01. The patients initial coags revealed PT of 14.0, INR 1.1, PTT of 35. Outpatient fasting lipid profiles in October of 2002 revealed cholesterol of 168, LDL of 101, HDL  44.  HOSPITAL COURSE:  #1 - ATYPICAL CHEST PAIN: The patient was admitted to the hospital and placed on telemetry. The patient was given an aspirin. The patients chest pain soon  resolved while in the hospital without any medical intervention. The patient did not require Nitroglycerin for pain relief or narcotic or other pain medication for pain relief.  The patient did have his enzymes cycled and were subsequently negative X3.  On day #2 of his hospital stay, the patient began having nausea and felt feverish.  The patient did not have a fever according to thermometer reading.  Given that his nausea and history of this chest pain, now in the left chest and his previous history of chest pain one month ago, as well as his positive smoking history and family history, cardiology was consulted.  Per [facility] and Dr. Graciela Husbands, it was recommended that the patient have an outpatient Cardiolite study. The patient was arranged for an outpatient Cardiolite study on the day of  discharge. The patient was stable upon discharge without chest pain and his nausea had resolved. The patient remained on room air with high 90s saturations. Per cardiology, it is recommended that the patient be discharged home on aspirin. The patients blood pressure was stable and not elevated during the current hospitalization. Further follow-up with [facility] and results to be faxed to the Internal Medicine Outpatient Clinic.  If Cardiolite study is negative, would consider possible antireflux medication. The patient was on Protonix while in the hospital. The patient is advised that he may take Pepcid over-the-counter if he has any further episodes, if his Cardiolite is negative.  Further follow-up with Dr. Alfonse Alpers as an outpatient regarding his chest pain after Cardiolite has been performed.  #2 - ABNORMAL CHEST X-RAY: The patient did have a chest x-ray which revealed some nodular changes in the past as  well as confluence of shadows on the current x-ray.  It is recommended that a 1-2 month follow-up be made. The patient does have a follow-up chest x-ray as discussed above.  Of note, the patient has had a CT scan in December 2002 which revealed some bilateral pleural plaques. These were noncalcified. The question as to asbestos exposure was introduced in that note.  Reviewing the patient social history, there is no history of asbestos exposure, such as with shipyards or substandard housing. The patient is a Dietitian but he has only been a Dietitian for approximately 3 years. Further follow-up as an outpatient with Dr. Alfonse Alpers.  #3 - HISTORY OF EPILEPSY: The patient is currently on Dilantin with a normal Dilantin level during current hospitalization.  The patients Dilantin level was 19.7. Continue on 200 mg b.i.d. at home.  No seizure activity X2-3 years per patient.  #4 - CHOLESTEROL LEVELS: The patient has had previous cholesterol levels checked as an outpatient with results as above.  No STAT medication warranted.  #5 - DRUG USE: The patient was positive for marijuana on his urine drug screen. The patient denies any marijuana use and states that he stopped smoking in January of 2003.  Further follow-up as an outpatient with education.  DISCHARGE LABORATORY: The patients most recent CBC reveals a white count of 4.3, hemoglobin 13.9, platelets 212. The patient did have a D-dimer checked on the day before discharge and it was less than 0.22. The patients C reactive protein was 0.5. Dictated by:   Jennette Kettle, M.D. Attending Physician:  Farley Ly DD:  06/09/01 TD:  06/10/01 Job: [phone]

## 2011-02-23 ENCOUNTER — Other Ambulatory Visit: Payer: Self-pay | Admitting: *Deleted

## 2011-02-23 DIAGNOSIS — R569 Unspecified convulsions: Secondary | ICD-10-CM

## 2011-02-24 NOTE — Telephone Encounter (Signed)
No. I have no documentation on pt. He can be seen by on [facility] resident this week. Would be best if he came in earlier that day or the day before to get blood - i put in the orders - so that resident will have results available. If he needs two or three days of meds, I will refill that amount but not a month's worth.

## 2011-02-24 NOTE — Telephone Encounter (Signed)
Pt is coming in tomorrow (02/25/11)for labs and doctor's appt.

## 2011-02-24 NOTE — Telephone Encounter (Signed)
Pt has scheduled an appt w/Dr Loistine Chance 03/10/2011; wants to know if he can get enough Dilantin medication until this appt. Last appt was 03/31/09.

## 2011-02-25 ENCOUNTER — Ambulatory Visit (INDEPENDENT_AMBULATORY_CARE_PROVIDER_SITE_OTHER): Payer: PRIVATE HEALTH INSURANCE | Admitting: Internal Medicine

## 2011-02-25 ENCOUNTER — Encounter: Payer: Self-pay | Admitting: Internal Medicine

## 2011-02-25 VITALS — BP 117/71 | HR 70 | Temp 97.9°F | Ht 66.0 in | Wt 138.5 lb

## 2011-02-25 DIAGNOSIS — G40909 Epilepsy, unspecified, not intractable, without status epilepticus: Secondary | ICD-10-CM

## 2011-02-25 DIAGNOSIS — R569 Unspecified convulsions: Secondary | ICD-10-CM

## 2011-02-25 DIAGNOSIS — Z23 Encounter for immunization: Secondary | ICD-10-CM

## 2011-02-25 LAB — COMPLETE METABOLIC PANEL WITH GFR
ALT: 21 U/L (ref 0–53)
BUN: 11 mg/dL (ref 6–23)
CO2: 29 mEq/L (ref 19–32)
Calcium: 8.9 mg/dL (ref 8.4–10.5)
Chloride: 102 mEq/L (ref 96–112)
Creat: 0.81 mg/dL (ref 0.50–1.35)
GFR, Est African American: >89 mL/min
Potassium: 3.6 mEq/L (ref 3.5–5.3)
Sodium: 140 mEq/L (ref 135–145)
Total Bilirubin: 0.2 mg/dL — ABNORMAL LOW (ref 0.3–1.2)

## 2011-02-25 LAB — CBC
Hemoglobin: 13.8 g/dL (ref 13.0–17.0)
MCH: 29.2 pg (ref 26.0–34.0)
Platelets: 251 10*3/uL (ref 150–400)
RBC: 4.73 MIL/uL (ref 4.22–5.81)
WBC: 6.2 10*3/uL (ref 4.0–10.5)

## 2011-02-25 LAB — LIPID PANEL
Cholesterol: 189 mg/dL (ref 0–200)
HDL: 62 mg/dL (ref >39)
LDL Cholesterol: 114 mg/dL — ABNORMAL HIGH (ref 0–99)
Triglycerides: 65 mg/dL (ref <150)
VLDL: 13 mg/dL (ref 0–40)

## 2011-02-25 LAB — PHENYTOIN LEVEL, TOTAL: Phenytoin Lvl: <2.5 ug/mL — ABNORMAL LOW (ref 10.0–20.0)

## 2011-02-25 MED ORDER — LEVETIRACETAM 250 MG PO TABS: 250.0000 mg | ORAL_TABLET | Freq: Two times a day (BID) | ORAL | Status: DC

## 2011-02-25 MED ORDER — PHENYTOIN SODIUM EXTENDED 100 MG PO CAPS: 200.0000 mg | ORAL_CAPSULE | Freq: Two times a day (BID) | ORAL | Status: DC

## 2011-02-25 NOTE — Assessment & Plan Note (Addendum)
Seizure disorder for about 35 years- without any structural abnormalities as described in history of present illness.  Due to his noncompliance with phenytoin and problem with checking levels closely and also she is pain $60 for Dilantin- after long discussion with Dr. Phillips Odor we decided to start him on Keppra 250 mg by mouth twice a day- which would be more cost effective and will have no need to followup blood levels.  I discussed this with him but he was reluctant initially as he says that his insurance does not help much with medications, for which we gave him printout for discount from [employer]- where he can get Keppra for a one-month supply for $16. He said that he will go [employer] today and pick up the prescription. I encouraged him to take Keppra regularly and followup with our clinic in 3 months. Also discussed with him the risks of having more seizures now after age of 79- namely hip fracture and other bone fractures which would be hard to heal.  He verbalized understanding and said that he will be compliant.  02/26/2011: Patient called back in morning as Keppra costs $144- we sent him to 2 different stores but it was not cheaper. Unfortunately- we have to switch him back to Dilantin 200 mg by mouth twice a day. I will send a prescription and she took him back in one week for Dilantin levels . I will put the order - Inocencio Homes will call him.

## 2011-02-25 NOTE — Progress Notes (Signed)
  Subjective:    Patient ID: Bill [name], male    DOB: 09-Oct-1960, 50 y.o.   MRN: [id number]  HPI Bill [name] is a 50 year old man with past with history of seizure disorder who comes today for followup for his seizure and get his Dilantin levels checked.  She has been noncompliant to the clinic followups. So he was called in for appointment and get his Dilantin levels checked. Dilantin level came back less than 2.5- which is subtherapeutic. Although he denies having any seizures for past one and half years.  He reports using Dilantin everyday until yesterday morning when he was out of his prescription, but his Dilantin level is less then 2.5.  We checked his lipid profile, CMP and CBC today- which did not show any significant abnormalities. LDL was 114- and is not diabetic. Hemoglobin, WBC were within normal limits. No significant liver enzyme abnormalities.  He seems to be reluctant in following up in the clinic or taking his medications regularly and does not seem to have a good insight of his disease process. Though he says that he has had seizure disorder since age 50 or 50. He says that he had total about 20-25 seizures in his lifetime- most of which were provoked with bright light, sleep deprivation, stress, watching TV.  He said that he does not remember being diagnosed with a structural brain abnormality.  Last CT had done in March 2011 does not show any abnormalities either.  He denies any fever, chills, nausea vomiting, abdominal pain, chest pain, short of breath, diarrhea, headache.     Review of Systems    as per history of present illness, all other systems reviewed and negative. Objective:   Physical Exam  General: NAD HEENT: PERRL, EOMI, no scleral icterus Cardiac: S1, S2, RRR, no rubs, murmurs or gallops Pulm: clear to auscultation bilaterally, moving normal volumes of air Abd: soft, nontender, nondistended, BS present Ext: warm and well perfused, no pedal  edema Neuro: alert and oriented X3, cranial nerves II-XII grossly intact       Assessment & Plan:

## 2011-02-25 NOTE — Patient Instructions (Addendum)
Please make a followup appointment in 3-4 months.  Go to [employer] and get the prescription for your new seizure medication. Take 250 mg- one tablet twice a day.  Stop taking Dilantin and tramadol.  Please follow up with appointment in 3-4 months.  If you have any other problems are new seizure meanwhile make an early appointment.  Any other questions or concerns about the cost of medication- call the clinic.

## 2011-02-26 MED ORDER — PHENYTOIN SODIUM EXTENDED 100 MG PO CAPS: 200.0000 mg | ORAL_CAPSULE | Freq: Two times a day (BID) | ORAL | Status: DC

## 2011-02-26 NOTE — Progress Notes (Signed)
Addended by: Sunday Spillers on: 02/26/2011 04:36 PM   Modules accepted: Orders

## 2011-03-05 ENCOUNTER — Other Ambulatory Visit (INDEPENDENT_AMBULATORY_CARE_PROVIDER_SITE_OTHER): Payer: PRIVATE HEALTH INSURANCE

## 2011-03-05 DIAGNOSIS — R569 Unspecified convulsions: Secondary | ICD-10-CM

## 2011-03-06 LAB — PHENYTOIN LEVEL, TOTAL: Phenytoin Lvl: 12.8 ug/mL (ref 10.0–20.0)

## 2011-03-10 ENCOUNTER — Ambulatory Visit: Payer: Self-pay | Admitting: Internal Medicine

## 2011-05-24 ENCOUNTER — Encounter: Payer: Self-pay | Admitting: Gastroenterology

## 2011-05-24 ENCOUNTER — Encounter: Payer: Self-pay | Admitting: Internal Medicine

## 2011-05-24 ENCOUNTER — Ambulatory Visit (HOSPITAL_COMMUNITY)
Admission: RE | Admit: 2011-05-24 | Discharge: 2011-05-24 | Disposition: A | Discharge status: Home / Self Care | Payer: Worker's Compensation | Source: Ambulatory Visit | Attending: Internal Medicine | Admitting: Internal Medicine

## 2011-05-24 ENCOUNTER — Ambulatory Visit (INDEPENDENT_AMBULATORY_CARE_PROVIDER_SITE_OTHER): Payer: PRIVATE HEALTH INSURANCE | Admitting: Internal Medicine

## 2011-05-24 VITALS — BP 106/67 | HR 74 | Temp 97.9°F | Ht 66.0 in | Wt 141.8 lb

## 2011-05-24 DIAGNOSIS — G40909 Epilepsy, unspecified, not intractable, without status epilepticus: Secondary | ICD-10-CM

## 2011-05-24 DIAGNOSIS — Z1211 Encounter for screening for malignant neoplasm of colon: Secondary | ICD-10-CM

## 2011-05-24 DIAGNOSIS — M25559 Pain in unspecified hip: Secondary | ICD-10-CM | POA: Insufficient documentation

## 2011-05-24 DIAGNOSIS — R569 Unspecified convulsions: Secondary | ICD-10-CM

## 2011-05-24 DIAGNOSIS — R930 Abnormal findings on diagnostic imaging of skull and head, not elsewhere classified: Secondary | ICD-10-CM

## 2011-05-24 DIAGNOSIS — Z79899 Other long term (current) drug therapy: Secondary | ICD-10-CM

## 2011-05-24 DIAGNOSIS — Z96649 Presence of unspecified artificial hip joint: Secondary | ICD-10-CM | POA: Insufficient documentation

## 2011-05-24 MED ORDER — MELOXICAM 7.5 MG PO TABS: 7.5000 mg | ORAL_TABLET | Freq: Every day | ORAL | Status: DC

## 2011-05-24 NOTE — Assessment & Plan Note (Signed)
Referred patient for Colonoscopy.

## 2011-05-24 NOTE — Assessment & Plan Note (Addendum)
Unclear etilogy. unlikely bursitis since no tenderness on palpation. Patient is s/p hip replacement in 2007 due to avascular necrosis. No problems since then. I will obtain xray of the right hip. I will further prescribe Mobic7.5 mg daily and recommended to stop using Ibuprofen and if pain worsen to be reevaluated in the clinic for further management. Update: Hip xray within normal limits. No acute findings.

## 2011-05-24 NOTE — Progress Notes (Signed)
Subjective:   Patient ID: Bill [name] male   DOB: 09-25-1960 51 y.o.   MRN: [id number]  HPI: Bill [name] is a 51 y.o. male with PMH significant as outlined below who presented to the clinic for a regualr office visit.  1. Patient noted that he was experiecing right hip pain since 2 weeks. He got up 2 weeks ago and noted pain in the groin area and back of his thigh which was aggravated while seating and standing up and alleviated with ibuprofen ( currently patient is taking 800 mg daily to get relieve of the pain). He further noted that the pain was radiating down his leg until the knee. Denies any knee pain or back pain. Denies any trauma or any other injuries.Patient noted he had pain in the past when he was diagnoses with avascular necrosis of the hip. Patient is s/p right hip pain.No fevers or chills  2. No seizure. Currently taking Dilantin bid  3. Tobacco abuse: half a pack   Past Medical History  Diagnosis Date  . Seizure disorder     Started at age 51. Last EMR record 05/25/09  . History of tobacco abuse     Stopped in 2005  . Chronic chest pain     Normal stress test in 2003  . Pleural plaque     MULTIPLE BILATERAL PLEURAL PLAQUES, which had not changed on CT as of 1/04  . Avascular necrosis of bone of hip 2007    Right  . Alcohol abuse     Hx of , stopped in 2000. Had 12 pack for 25 years.    Current Outpatient Prescriptions  Medication Sig Dispense Refill  . phenytoin (DILANTIN) 100 MG ER capsule Take 2 capsules (200 mg total) by mouth 2 (two) times daily.  120 capsule  3   Family History  Problem Relation Age of Onset  . Heart attack Father   . Arthritis Mother    History   Social History  . Marital Status: Single    Spouse Name: N/A    Number of Children: N/A  . Years of Education: N/A   Social History Main Topics  . Smoking status: Current Everyday Smoker -- 0.5 packs/day for .5 years    Types: Cigarettes  . Smokeless tobacco: None   Comment:  Quit smoking for many years and just started around June 2012- after one of the close friend died.  . Alcohol Use: No     Former  . Drug Use: No  . Sexually Active: Yes    Birth Control/ Protection: Condom     1 Partner,    Other Topics Concern  . None   Social History Narrative   Custodian, single.    Review of Systems: Constitutional: Denies fever, chills, diaphoresis, appetite change and fatigue.   Respiratory: Denies SOB, DOE, cough, chest tightness,  and wheezing.   Cardiovascular: Denies chest pain, palpitations and leg swelling.  Gastrointestinal: Denies nausea, vomiting, abdominal pain, diarrhea, constipation, blood in stool and abdominal distention.  Genitourinary: Denies dysuria, urgency, frequency, hematuria, flank pain and difficulty urinating.  Skin: Denies pallor, rash and wound.  Neurological: Denies dizziness, seizures, syncope, weakness, light-headedness, numbness and headaches.    Objective:  Physical Exam: Filed Vitals:   05/24/11 1351  BP: 106/67  Pulse: 74  Temp: 97.9 F (36.6 C)  TempSrc: Oral  Height: 5\' 6"  (1.676 m)  Weight: 141 lb 12.8 oz (64.32 kg)  SpO2: 99%   Constitutional: Vital signs reviewed.  Patient is a well-developed and well-nourished man in no acute distress and cooperative with exam. Alert and oriented x3.  Neck: Supple,  Cardiovascular: RRR, S1 normal, S2 normal, Pulmonary/Chest: CTAB, no wheezes, rales, or rhonchi Abdominal: Soft. Non-tender, non-distended, bowel sounds are normal, Musculoskeletal: Right hip and thigh; pain aggravated in the right groin and posterior aspect of thigh with internal and external rotation as well as flexion. No tenderness on palpation present.   No joint deformities, erythema, or stiffness, ROM full.   Neurological: A&O x3, Strenght is normal and symmetric bilaterally, no focal motor deficit, sensory intact to light touch bilaterally.  Skin: Warm, dry and intact. No rash, cyanosis, or clubbing.

## 2011-05-24 NOTE — Patient Instructions (Addendum)
Please call the clinic if your pain is worse to be seen earlier. Stop taking Ibuprofen Take Meloxicam once a day. If your pain is worse you can take two. But not more than that.

## 2011-05-24 NOTE — Assessment & Plan Note (Signed)
Will obtain CXray to follow up pleural plaques.

## 2011-05-24 NOTE — Assessment & Plan Note (Signed)
Will continue Dilantin since patient's insurance does not pay for Keppra. Will obtain Dilantin level today for possible dose adjustment. Patient has been seizure free since 02/2011.

## 2011-05-25 ENCOUNTER — Other Ambulatory Visit: Payer: Self-pay | Admitting: Internal Medicine

## 2011-05-25 DIAGNOSIS — R569 Unspecified convulsions: Secondary | ICD-10-CM

## 2011-05-25 LAB — PHENYTOIN LEVEL, TOTAL: Phenytoin Lvl: 7.5 ug/mL — ABNORMAL LOW (ref 10.0–20.0)

## 2011-05-31 ENCOUNTER — Telehealth: Payer: Self-pay | Admitting: *Deleted

## 2011-05-31 ENCOUNTER — Other Ambulatory Visit: Payer: Self-pay | Admitting: Internal Medicine

## 2011-05-31 DIAGNOSIS — R930 Abnormal findings on diagnostic imaging of skull and head, not elsewhere classified: Secondary | ICD-10-CM

## 2011-05-31 NOTE — Telephone Encounter (Signed)
Call to pt informed of need for CT of Chest and scheduled date for. Pt is scheduled for 06/04/2011 at 12:30 PM.  Pt was asked to arrive by 12:15 PM in Radiology.  Pt voiced understanding of the appointment time and where to go.  Angelina Ok, RN 05/31/2011 3:32 PM.

## 2011-06-04 ENCOUNTER — Ambulatory Visit (HOSPITAL_COMMUNITY)
Admission: RE | Admit: 2011-06-04 | Discharge: 2011-06-04 | Disposition: A | Discharge status: Home / Self Care | Payer: PRIVATE HEALTH INSURANCE | Source: Ambulatory Visit | Attending: Internal Medicine | Admitting: Internal Medicine

## 2011-06-04 DIAGNOSIS — R930 Abnormal findings on diagnostic imaging of skull and head, not elsewhere classified: Secondary | ICD-10-CM

## 2011-06-04 DIAGNOSIS — K8020 Calculus of gallbladder without cholecystitis without obstruction: Secondary | ICD-10-CM | POA: Insufficient documentation

## 2011-06-04 DIAGNOSIS — R918 Other nonspecific abnormal finding of lung field: Secondary | ICD-10-CM | POA: Insufficient documentation

## 2011-06-08 ENCOUNTER — Other Ambulatory Visit: Payer: PRIVATE HEALTH INSURANCE

## 2011-06-14 ENCOUNTER — Telehealth: Payer: Self-pay | Admitting: *Deleted

## 2011-06-14 ENCOUNTER — Ambulatory Visit (AMBULATORY_SURGERY_CENTER): Payer: PRIVATE HEALTH INSURANCE | Admitting: *Deleted

## 2011-06-14 VITALS — Ht 66.0 in | Wt 141.0 lb

## 2011-06-14 DIAGNOSIS — Z1211 Encounter for screening for malignant neoplasm of colon: Secondary | ICD-10-CM

## 2011-06-14 MED ORDER — PEG-KCL-NACL-NASULF-NA ASC-C 100 G PO SOLR: ORAL | Status: DC

## 2011-06-14 NOTE — Telephone Encounter (Signed)
Pt c/o pain to right hip.  Onset of pain in March, was seen in clinic 3/18 and Xrays done. Pain med does not work. Needs to use crutches, unable to bear weight. Will see for f/u tomorrow.  Pt unable to come in today

## 2011-06-15 ENCOUNTER — Encounter: Payer: Self-pay | Admitting: Internal Medicine

## 2011-06-15 ENCOUNTER — Ambulatory Visit (INDEPENDENT_AMBULATORY_CARE_PROVIDER_SITE_OTHER): Payer: PRIVATE HEALTH INSURANCE | Admitting: Internal Medicine

## 2011-06-15 VITALS — BP 110/61 | HR 73 | Temp 97.7°F | Ht 66.0 in | Wt 135.6 lb

## 2011-06-15 DIAGNOSIS — R569 Unspecified convulsions: Secondary | ICD-10-CM

## 2011-06-15 DIAGNOSIS — Z79899 Other long term (current) drug therapy: Secondary | ICD-10-CM

## 2011-06-15 DIAGNOSIS — M5410 Radiculopathy, site unspecified: Secondary | ICD-10-CM

## 2011-06-15 DIAGNOSIS — M25551 Pain in right hip: Secondary | ICD-10-CM

## 2011-06-15 DIAGNOSIS — M25559 Pain in unspecified hip: Secondary | ICD-10-CM

## 2011-06-15 LAB — CBC
HCT: 38.6 % — ABNORMAL LOW (ref 39.0–52.0)
Platelets: 224 10*3/uL (ref 150–400)
RBC: 4.66 MIL/uL (ref 4.22–5.81)
RDW: 14.2 % (ref 11.5–15.5)
WBC: 6.2 10*3/uL (ref 4.0–10.5)

## 2011-06-15 MED ORDER — MELOXICAM 7.5 MG PO TABS: ORAL_TABLET | ORAL | Status: DC

## 2011-06-15 MED ORDER — GABAPENTIN 300 MG PO CAPS: 300.0000 mg | ORAL_CAPSULE | Freq: Three times a day (TID) | ORAL | Status: DC

## 2011-06-15 NOTE — Patient Instructions (Addendum)
Please, take all your medications as prescribed. Please, pick up a prescription for Gabapentin (Neurontin) and increase Meloxicam (Mobic) to one tablet two times a day. Our office will contact you for  A nerve conduction study. Please, follow up in 2 weeks.

## 2011-06-15 NOTE — Progress Notes (Signed)
Patient ID: Bill [name], male   DOB: 04/17/1960, 51 y.o.   MRN: [id number] HPI:     1. Right hip pain for 1.2 months; sudden onset, without any particular pattern, "burning" and 6/10 in intensity, intermittent; no alleviating or aggravating factors; no trauma. Never similar Sx in the past. Seen in ED -xray eng. Review of Systems: Negative except per history of present illness  Physical Exam:  Nursing notes and vitals reviewed General:  alert, well-developed, and cooperative to examination.   Lungs:  normal respiratory effort, no accessory muscle use, normal breath sounds, no crackles, and no wheezes. Heart:  normal rate, regular rhythm, no murmurs, no gallop, and no rub.   Abdomen:  soft, non-tender, normal bowel sounds, no distention, no guarding, no rebound tenderness, no hepatomegaly, and no splenomegaly.   Extremities:  No cyanosis, clubbing, edema Right hip with old and well-healed scar posteriorly; no erythema, edema, increase in warmth or TTP over the scar. Right anterior thigh TTP along femoral nerve; decreased elevation of right thigh up to 100 degrees; pain on external rotation of right hip; no crepitus Neurologic:  alert & oriented X3, nonfocal exam  Meds: Medications Prior to Admission  Medication Sig Dispense Refill  . meloxicam (MOBIC) 7.5 MG tablet Take 1 tablet (7.5 mg total) by mouth daily.  30 tablet  0  . peg 3350 powder (MOVIPREP) SOLR moviprep-take as directed.  1 kit  0  . phenytoin (DILANTIN) 100 MG ER capsule Take 2 capsules (200 mg total) by mouth 2 (two) times daily.  120 capsule  3   No current facility-administered medications on file as of 06/15/2011.    Allergies: Review of patient's allergies indicates no known allergies. Past Medical History  Diagnosis Date  . Seizure disorder     Started at age 58. Last EMR record 05/25/09  . History of tobacco abuse     Stopped in 2005  . Chronic chest pain     Normal stress test in 2003  . Pleural plaque    MULTIPLE BILATERAL PLEURAL PLAQUES, which had not changed on CT as of 1/04  . Avascular necrosis of bone of hip 2007    Right  . Alcohol abuse     Hx of , stopped in 2000. Had 12 pack for 25 years.   . Seizures   . Arthritis    Past Surgical History  Procedure Date  . Right hip replacement 8/07    Dr. Charlann Boxer   Family History  Problem Relation Age of Onset  . Heart attack Father   . Arthritis Mother   . Colon cancer Neg Hx    History   Social History  . Marital Status: Single    Spouse Name: N/A    Number of Children: N/A  . Years of Education: N/A   Occupational History  . Not on file.   Social History Main Topics  . Smoking status: Current Everyday Smoker -- 0.5 packs/day for .5 years    Types: Cigarettes  . Smokeless tobacco: Never Used   Comment: Quit smoking for many years and just started around June 2012- after one of the close friend died.  . Alcohol Use: No     Former  . Drug Use: No  . Sexually Active: Yes    Birth Control/ Protection: Condom     1 Partner,    Other Topics Concern  . Not on file   Social History Narrative   Custodian, single.     A/P:  1.  Right hip and lower extremity neuralgia (femoral nerver vs rami of L1-L3 (?) -Hx of right knee replacement 2/2 avascular hip necrosis in 2007. -XR neg for hardware loosening/infection -ESR, CBC, TSh, Vit B12, Folate -EMG and Nerve conduction study -Gabapentin -Increase mobic to 7.5 mg PO bid PRN -fall precautions -f/u in 2 weeks or sooner.

## 2011-06-16 LAB — TSH: TSH: 1.07 u[IU]/mL (ref 0.350–4.500)

## 2011-06-16 LAB — VITAMIN B12: Vitamin B-12: 516 pg/mL (ref 211–911)

## 2011-06-16 LAB — FOLATE RBC: RBC Folate: 452 ng/mL (ref >=366)

## 2011-06-19 LAB — PHENYTOIN LEVEL, FREE AND TOTAL

## 2011-06-24 ENCOUNTER — Encounter: Payer: PRIVATE HEALTH INSURANCE | Admitting: Internal Medicine

## 2011-06-25 ENCOUNTER — Encounter: Payer: Self-pay | Admitting: Gastroenterology

## 2011-06-25 ENCOUNTER — Ambulatory Visit (AMBULATORY_SURGERY_CENTER): Payer: PRIVATE HEALTH INSURANCE | Admitting: Gastroenterology

## 2011-06-25 VITALS — BP 118/75 | HR 63 | Temp 98.9°F | Resp 22 | Ht 66.0 in | Wt 141.0 lb

## 2011-06-25 DIAGNOSIS — Z1211 Encounter for screening for malignant neoplasm of colon: Secondary | ICD-10-CM

## 2011-06-25 DIAGNOSIS — D126 Benign neoplasm of colon, unspecified: Secondary | ICD-10-CM

## 2011-06-25 MED ORDER — SODIUM CHLORIDE 0.9 % IV SOLN: 500.0000 mL | INTRAVENOUS | Status: DC

## 2011-06-25 NOTE — Op Note (Signed)
[facility] [address] [place], [place]  [address]  COLONOSCOPY PROCEDURE REPORT  PATIENT:  Bill [name], Bill [name]  MR#:  [id number] BIRTHDATE:  03-06-1961, 51 yrs. old  GENDER:  male ENDOSCOPIST:  Rachael Fee, MD REFERRING:   Almyra Deforest, MD PROCEDURE DATE:  06/25/2011 PROCEDURE:  Colonoscopy with biopsy and snare polypectomy ASA CLASS:  Class II INDICATIONS:  Routine Risk Screening MEDICATIONS:   Fentanyl 75 mcg IV, These medications were titrated to patient response per physician's verbal order, Versed 7 mg IV  DESCRIPTION OF PROCEDURE:   After the risks benefits and alternatives of the procedure were thoroughly explained, informed consent was obtained.  Digital rectal exam was performed and revealed no rectal masses.   The LB [id number] endoscope was introduced through the anus and advanced to the cecum, which was identified by both the appendix and ileocecal valve, without limitations.  The quality of the prep was good..  The instrument was then slowly withdrawn as the colon was fully examined. <<PROCEDUREIMAGES>> FINDINGS:  Three polyps were found. One was 2mm across, sessile, located in ascending colon, removed with forceps, sent to pathology (jar 1). One was 4mm across, sessile, located in transverse, removed with cold snare, sent to pathology (jar 1). The last was pedunculated, 1.5cm, located in sigmoid, removed with snare/cautery, sent to pathology (jar 2) (see image3, image4, and image5).  This was otherwise a normal examination of the colon (see image8, image2, and image1).   Retroflexed views in the rectum revealed no abnormalities. COMPLICATIONS:  None  ENDOSCOPIC IMPRESSION: 1) Three polyps, all removed and all sent to pathology 2) Otherwise normal examination  RECOMMENDATIONS: 1) If the polyp(s) removed today are proven to be adenomatous (pre-cancerous) polyps, you will need a colonoscopy in 3 years. Otherwise you should continue to  follow colorectal cancer screening guidelines for "routine risk" patients with a colonoscopy in 10 years. You will receive a letter within 1-2 weeks with the results of your biopsy as well as final recommendations. Please call my office if you have not received a letter after 3 weeks.  ______________________________ Rachael Fee, MD  n. eSIGNED:   Rachael Fee at 06/25/2011 01:45 PM  Neysa Bonito, [phone]

## 2011-06-25 NOTE — Progress Notes (Signed)
Patient did not experience any of the following events: a burn prior to discharge; a fall within the facility; wrong site/side/patient/procedure/implant event; or a hospital transfer or hospital admission upon discharge from the facility. (G8907) Patient did not have preoperative order for IV antibiotic SSI prophylaxis. (G8918)  

## 2011-06-25 NOTE — Progress Notes (Signed)
No complaints noted in the recovery room. Maw   

## 2011-06-25 NOTE — Patient Instructions (Signed)
Handouts given to your care partner on polyps.  You may resume your prior medications today.  Please call if you have any questions or concerns.   YOU HAD AN ENDOSCOPIC PROCEDURE TODAY AT THE [facility]: Refer to the procedure report that was given to you for any specific questions about what was found during the examination.  If the procedure report does not answer your questions, please call your gastroenterologist to clarify.  If you requested that your care partner not be given the details of your procedure findings, then the procedure report has been included in a sealed envelope for you to review at your convenience later.  YOU SHOULD EXPECT: Some feelings of bloating in the abdomen. Passage of more gas than usual.  Walking can help get rid of the air that was put into your GI tract during the procedure and reduce the bloating. If you had a lower endoscopy (such as a colonoscopy or flexible sigmoidoscopy) you may notice spotting of blood in your stool or on the toilet paper. If you underwent a bowel prep for your procedure, then you may not have a normal bowel movement for a few days.  DIET: Your first meal following the procedure should be a light meal and then it is ok to progress to your normal diet.  A half-sandwich or bowl of soup is an example of a good first meal.  Heavy or fried foods are harder to digest and may make you feel nauseous or bloated.  Likewise meals heavy in dairy and vegetables can cause extra gas to form and this can also increase the bloating.  Drink plenty of fluids but you should avoid alcoholic beverages for 24 hours.  ACTIVITY: Your care partner should take you home directly after the procedure.  You should plan to take it easy, moving slowly for the rest of the day.  You can resume normal activity the day after the procedure however you should NOT DRIVE or use heavy machinery for 24 hours (because of the sedation medicines used during the test).     SYMPTOMS TO REPORT IMMEDIATELY: A gastroenterologist can be reached at any hour.  During normal business hours, 8:30 AM to 5:00 PM Monday through Friday, call [phone].  After hours and on weekends, please call the GI answering service at [phone] who will take a message and have the physician on call contact you.   Following lower endoscopy (colonoscopy or flexible sigmoidoscopy):  Excessive amounts of blood in the stool  Significant tenderness or worsening of abdominal pains  Swelling of the abdomen that is new, acute  Fever of 100F or higher    FOLLOW UP: If any biopsies were taken you will be contacted by phone or by letter within the next 1-3 weeks.  Call your gastroenterologist if you have not heard about the biopsies in 3 weeks.  Our staff will call the home number listed on your records the next business day following your procedure to check on you and address any questions or concerns that you may have at that time regarding the information given to you following your procedure. This is a courtesy call and so if there is no answer at the home number and we have not heard from you through the emergency physician on call, we will assume that you have returned to your regular daily activities without incident.  SIGNATURES/CONFIDENTIALITY: You and/or your care partner have signed paperwork which will be entered into your electronic medical record.  These signatures attest to the fact that that the information above on your After Visit Summary has been reviewed and is understood.  Full responsibility of the confidentiality of this discharge information lies with you and/or your care-partner.  

## 2011-06-28 ENCOUNTER — Telehealth: Payer: Self-pay | Admitting: *Deleted

## 2011-06-28 NOTE — Telephone Encounter (Signed)
  Follow up Call-  Call back number 06/25/2011  Post procedure Call Back phone  # [phone]  Permission to leave phone message Yes     Patient questions:  Do you have a fever, pain , or abdominal swelling? no Pain Score  0 *  Have you tolerated food without any problems? yes  Have you been able to return to your normal activities? yes  Do you have any questions about your discharge instructions: Diet   no Medications  no Follow up visit  no  Do you have questions or concerns about your Care? no  Actions: * If pain score is 4 or above: No action needed, pain <4.

## 2011-06-29 ENCOUNTER — Ambulatory Visit (INDEPENDENT_AMBULATORY_CARE_PROVIDER_SITE_OTHER): Payer: PRIVATE HEALTH INSURANCE | Admitting: Internal Medicine

## 2011-06-29 ENCOUNTER — Telehealth: Payer: Self-pay | Admitting: Internal Medicine

## 2011-06-29 ENCOUNTER — Encounter: Payer: Self-pay | Admitting: Internal Medicine

## 2011-06-29 VITALS — BP 124/74 | HR 65 | Temp 97.8°F | Ht 66.0 in | Wt 139.0 lb

## 2011-06-29 DIAGNOSIS — M79604 Pain in right leg: Secondary | ICD-10-CM

## 2011-06-29 DIAGNOSIS — Z23 Encounter for immunization: Secondary | ICD-10-CM

## 2011-06-29 DIAGNOSIS — M5410 Radiculopathy, site unspecified: Secondary | ICD-10-CM

## 2011-06-29 DIAGNOSIS — G5790 Unspecified mononeuropathy of unspecified lower limb: Secondary | ICD-10-CM

## 2011-06-29 MED ORDER — GABAPENTIN 600 MG PO TABS: 600.0000 mg | ORAL_TABLET | Freq: Three times a day (TID) | ORAL | Status: DC

## 2011-06-29 NOTE — Progress Notes (Signed)
Patient ID: Bill [name], male   DOB: [birth year], 51 y.o.   MRN: [id number] HPI:    Follow up on: 1. Right thigh pain; no improvement, however, noticed some pain alleviation with gabapentin. Denies any fever, chills, new trauma.  Review of Systems: Negative except per history of present illness  Physical Exam:  Nursing notes and vitals reviewed General:  alert, well-developed, and cooperative to examination.   Lungs:  normal respiratory effort, no accessory muscle use, normal breath sounds, no crackles, and no wheezes. Heart:  normal rate, regular rhythm, no murmurs, no gallop, and no rub.   Abdomen:  soft, non-tender, normal bowel sounds, no distention, no guarding, no rebound tenderness, no hepatomegaly, and no splenomegaly.   Extremities:  No change in findings. Neurologic:  alert & oriented X3, nonfocal exam  Meds:  (Not in a hospital admission)  Allergies: Review of patient's allergies indicates no known allergies. Past Medical History  Diagnosis Date  . Seizure disorder     Started at age 51. Last EMR record 05/25/09  . History of tobacco abuse     Stopped in 2005  . Chronic chest pain     Normal stress test in 2003  . Pleural plaque     MULTIPLE BILATERAL PLEURAL PLAQUES, which had not changed on CT as of 1/04  . Avascular necrosis of bone of hip 2007    Right  . Alcohol abuse     Hx of , stopped in 2000. Had 12 pack for 25 years.   . Seizures   . Arthritis    Past Surgical History  Procedure Date  . Right hip replacement 8/07    Dr. Charlann Boxer   Family History  Problem Relation Age of Onset  . Heart attack Father   . Arthritis Mother   . Colon cancer Neg Hx    History   Social History  . Marital Status: Single    Spouse Name: N/A    Number of Children: N/A  . Years of Education: N/A   Occupational History  . Not on file.   Social History Main Topics  . Smoking status: Current Everyday Smoker -- 0.5 packs/day for .5 years    Types: Cigarettes  .  Smokeless tobacco: Never Used   Comment: Quit smoking for many years and just started around June 2012- after one of the close friend died.  . Alcohol Use: No     Former  . Drug Use: No  . Sexually Active: Yes    Birth Control/ Protection: Condom     1 Partner,    Other Topics Concern  . Not on file   Social History Narrative   Custodian, single.     A/P: 1. Right thigh neuralgia  -increase Gabapentin dose to 600 mg PO tid PRN (cautioned of sedation) -off work for 1 week -f/u on EMG and NCS -fall precuations -f/u after the test.

## 2011-06-29 NOTE — Telephone Encounter (Signed)
Error

## 2011-06-29 NOTE — Patient Instructions (Addendum)
Please, take all your medications as prescribed and call with any questions.: I increased the dose of Gabapentin up to 600 mg by mouth three times daily. Please, follow up with a Nerve conduction study. Please, follow up after the test is done.

## 2011-07-01 ENCOUNTER — Encounter: Payer: Self-pay | Admitting: Gastroenterology

## 2011-07-07 NOTE — Progress Notes (Signed)
Addended by: Remus Blake on: 07/07/2011 04:35 PM   Modules accepted: Orders

## 2011-07-21 ENCOUNTER — Encounter: Payer: Self-pay | Admitting: Ophthalmology

## 2011-07-21 ENCOUNTER — Ambulatory Visit (INDEPENDENT_AMBULATORY_CARE_PROVIDER_SITE_OTHER): Payer: PRIVATE HEALTH INSURANCE | Admitting: Ophthalmology

## 2011-07-21 VITALS — BP 116/65 | HR 63 | Temp 97.1°F | Ht 66.0 in | Wt 136.3 lb

## 2011-07-21 DIAGNOSIS — M25559 Pain in unspecified hip: Secondary | ICD-10-CM

## 2011-07-21 NOTE — Progress Notes (Signed)
Subjective:   Patient ID: Bill [name] male   DOB: [birth year] 51 y.o.   MRN: [id number]  HPI: Bill [name] is a 51 y.o. man who presents for follow up of right leg pain. Has been seen here twice by Dr. Denton Meek and was given gabapentin & increased Mobic and referred for nerve conduction studies. He hasn't been called with an appt yet for these. Pain started in March. Has been about the same. If he sits with the leg straight it doesn't hurt much. Pain is mostly when he tries to walk on it or when he is trying to get comfortable at night. Is walking with crutches and touches down the foot without pain. Gabapentin may be helping with burning pain. Pain is 7/10. 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 Is taking mobic once a day only at about 5pm.  The medial gluteal pain is bothering him much less than the leg pain. Only really has pain in back when he lays on it or when he bends over.  Past Medical History  Diagnosis Date  . Seizure disorder     Started at age 71. Last EMR record 05/25/09  . History of tobacco abuse     Stopped in 2005  . Chronic chest pain     Normal stress test in 2003  . Pleural plaque     MULTIPLE BILATERAL PLEURAL PLAQUES, which had not changed on CT as of 1/04  . Avascular necrosis of bone of hip 2007    Right  . Alcohol abuse     Hx of , stopped in 2000. Had 12 pack for 25 years.   . Seizures   . Arthritis    Current Outpatient Prescriptions  Medication Sig Dispense Refill  . gabapentin (NEURONTIN) 600 MG tablet Take 1 tablet (600 mg total) by mouth 3 (three) times daily.  90 tablet  3  . meloxicam (MOBIC) 7.5 MG tablet Take one tablet by mouth two times daily as needed with meals for pain  60 tablet  2  . phenytoin (DILANTIN) 100 MG ER capsule Take 2 capsules (200 mg total) by mouth 2 (two) times daily.  120 capsule  3   Family History  Problem Relation Age of Onset  . Heart attack Father   .  Arthritis Mother   . Colon cancer Neg Hx    History   Social History  . Marital Status: Single    Spouse Name: N/A    Number of Children: N/A  . Years of Education: N/A   Social History Main Topics  . Smoking status: Current Everyday Smoker -- 0.5 packs/day    Types: Cigarettes  . Smokeless tobacco: Never Used  . Alcohol Use: No     Former  . Drug Use: No  . Sexually Active: Yes    Birth Control/ Protection: Condom     1 Partner,    Other Topics Concern  . Not on file   Social History Narrative   Custodian, single.    Objective:  Physical Exam: Filed Vitals:   07/21/11 0954  BP: 116/65  Pulse: 63  Temp: 97.1 F (36.2 C)  TempSrc: Oral  Height: 5\' 6"  (1.676 m)  Weight: 136 lb 4.8 oz (61.825 kg)   General: pleasant thin middle aged man sitting in chair in mild distress HEENT: PERRL, EOMI, no scleral icterus  Ext: TTP on medial gluteal area, no TTP of anterior thigh, external  rotation causes pain in anterior thigh and internal rotation causes gluteal pain. Straight leg raise causes pain in anterior thigh and patient is unable to lift leg more than a few centimeters. Back flexion causes pain. Back extension does not cause pain.  Neuro: alert and oriented X3, cranial nerves II-XII grossly intact. Sensation to light touch in bilateral feet and legs intact. Patient has atalgic gait and grimaces with trying to walk. Able to stand comfortably. Knee reflexes are brisk but equal.  Assessment & Plan:

## 2011-07-21 NOTE — Patient Instructions (Signed)
-  We will refer you to a pain clinic for an injection that may help with the pain -Please STOP using a tight  Belt -We will call you to arrange for nerve conduction studies

## 2011-07-21 NOTE — Assessment & Plan Note (Signed)
Patient's anterior hip pain seems to have some symptoms of meralgia paresthetica so we asked loosen his belt or avoid wearing belts for the next few weeks, could also be a L3/L4 radiculopathy. We have also arranged for him to get a nerve conduction study. It has not improved so we will refer him to pain management in case he can get relief from a steroid injection.

## 2011-07-22 ENCOUNTER — Encounter: Payer: Self-pay | Admitting: Internal Medicine

## 2011-07-29 NOTE — Progress Notes (Signed)
Addended by: Maura Crandall on: 07/29/2011 08:28 AM   Modules accepted: Orders

## 2011-08-17 ENCOUNTER — Ambulatory Visit (INDEPENDENT_AMBULATORY_CARE_PROVIDER_SITE_OTHER): Payer: PRIVATE HEALTH INSURANCE | Admitting: Internal Medicine

## 2011-08-17 ENCOUNTER — Encounter: Payer: Self-pay | Admitting: Internal Medicine

## 2011-08-17 VITALS — BP 102/61 | HR 66 | Temp 98.2°F | Ht 66.0 in | Wt 136.1 lb

## 2011-08-17 DIAGNOSIS — M25559 Pain in unspecified hip: Secondary | ICD-10-CM

## 2011-08-17 MED ORDER — TRAMADOL HCL 50 MG PO TABS: 50.0000 mg | ORAL_TABLET | Freq: Four times a day (QID) | ORAL | Status: DC | PRN

## 2011-08-17 MED ORDER — NAPROXEN SODIUM 220 MG PO CAPS: 220.0000 mg | ORAL_CAPSULE | Freq: Two times a day (BID) | ORAL | Status: DC

## 2011-08-17 NOTE — Progress Notes (Signed)
Subjective:     Patient ID: Bill [name], male   DOB: Jul 11, 1960, 51 y.o.   MRN: [id number]  Leg Pain  Incident onset: The patient states in March he just awoke one day and had pain in his right thigh. The incident occurred at home. Injury mechanism: He could identify no mechanism of injury such as a fall, strain, or over work. The pain is present in the right thigh. Quality: Sharp. Denies feeling of lactic acid buildup or burning. Pain starts in right thigh and then radiates to right gluteus. The pain is at a severity of 7/10. The pain is severe. The pain has been fluctuating since onset. Associated symptoms include an inability to bear weight. Pertinent negatives include no loss of motion, loss of sensation, muscle weakness, numbness or tingling. Associated symptoms comments: Denies falls, feeling of instability, fevers, chills, loss of sensation, and incontinence. The symptoms are aggravated by weight bearing (Especially aggravated by ambulation but has baseline pain at rest.). He has tried elevation, NSAIDs and non-weight bearing (Currently taking Mobic and gabapentin) for the symptoms. The treatment provided mild (Gabapentin provided mild relief of lower leg pain but does nothing for his thigh and gluteal pain) relief.   has a history of total right hip replacement in 2004 secondary to avascular necrosis. He is a former heavy alcoholic but has since stopped drinking. He has not gotten the EMG ordered previously because he states they have never called him to set this up.  Review of Systems  Constitutional: Negative for fever, chills and activity change.  Respiratory: Negative for shortness of breath.   Cardiovascular: Negative for chest pain.  Musculoskeletal: Positive for myalgias, arthralgias and gait problem.  Neurological: Negative for dizziness, tingling, tremors, seizures, syncope, weakness and numbness.       Objective:   Physical Exam  Constitutional: He appears well-developed and  well-nourished. No distress.  Cardiovascular: Normal rate and normal heart sounds.  Exam reveals decreased pulses.   Pulses:      Femoral pulses are 2+ on the right side.      Popliteal pulses are 1+ on the right side, and 1+ on the left side.       Dorsalis pedis pulses are 1+ on the right side, and 1+ on the left side.       Posterior tibial pulses are 1+ on the right side, and 1+ on the left side.  Pulmonary/Chest: Effort normal and breath sounds normal. No respiratory distress.  Musculoskeletal: He exhibits tenderness. He exhibits no edema.       Lumbar back: He exhibits tenderness, bony tenderness and pain. He exhibits normal range of motion, no swelling, no edema, no deformity and no laceration.       Right upper leg: He exhibits tenderness and deformity. He exhibits no swelling and no edema.       Right thigh is considerably smaller than left. Approximately 50-75% diameter  Neurological: He is alert. He displays atrophy and tremor. He displays normal reflexes. No cranial nerve deficit or sensory deficit. He exhibits abnormal muscle tone. Coordination abnormal. He displays no Babinski's sign on the right side. He displays no Babinski's sign on the left side.  Reflex Scores:      Patellar reflexes are 2+ on the right side and 2+ on the left side.      Achilles reflexes are 2+ on the right side and 2+ on the left side. Skin: Skin is warm and dry. No rash noted. He is not diaphoretic.  No erythema. No pallor.  Psychiatric: He has a normal mood and affect. His behavior is normal. Judgment and thought content normal.       Assessment:     Case discussed with Dr. Josem Kaufmann. Please see problem oriented charting for assessment and plan by problem (best viewed under encounters tab).

## 2011-08-17 NOTE — Patient Instructions (Addendum)
We have made you appointments to get nerve conduction tests to test her nerves and an ABI to test your blood flow to your legs. Hopefully this will determine the cause of your leg pain. Please do not drive when taking tramadol or operate heavy equipment. This medication can make you sedated. Please take naproxen to 220 mg twice daily with meals (breakfast and dinner). This medication is available over-the-counter.  We will see you back in one week after you have had these tests performed. If you have not been able to get this test performed, please call our clinic.

## 2011-08-17 NOTE — Assessment & Plan Note (Addendum)
I am quite concerned at the level of atrophy in his right thigh. This makes me think that there is some anatomic abnormality either neurologic (trophic/stimulatory atrophy) or vascular abnormality that is leading to his right thigh and hip pain. The patient has symptoms that are consistent with the L2 nerve root. This route supplies both gluteus and the anterior thigh. It is frustrating that he has not yet obtained his EMG as this would be able to determine if he does have a neurologic problem in which root(s) is/are involved. I requested that he not leave his appointment today until we have this set up. Unfortunately, the health insurance that he has will only pay $25 total towards the cost of this study. He states he got his insurance from an advertisement on TV. I have ordered both an EMG and an ABI. The patient states he is unable to afford both out-of-pocket at this time. He will work on getting the EMG and then we will try to obtain the ankle-brachial index. If both these tests are negative he will likely require an MRI for further workup. I have asked the patient to follow up in one week, only after he gets at least one of these studies done. In the meantime I will recommend naproxen 220 mg by mouth twice a day with food. He can't take one additional tablet of naproxen for breakthrough pain. I will also provide a small number of tramadol 50 mg tablets (dispensed #20) for him to take for breakthrough pain. I counseled him that this could cause drowsiness and he should not take them if he is operating heavy machinery or driving.

## 2011-09-13 ENCOUNTER — Ambulatory Visit (HOSPITAL_COMMUNITY)
Admission: RE | Admit: 2011-09-13 | Discharge: 2011-09-13 | Disposition: A | Discharge status: Home / Self Care | Payer: PRIVATE HEALTH INSURANCE | Source: Ambulatory Visit | Attending: Internal Medicine | Admitting: Internal Medicine

## 2011-09-13 DIAGNOSIS — M79609 Pain in unspecified limb: Secondary | ICD-10-CM

## 2011-09-13 DIAGNOSIS — M25559 Pain in unspecified hip: Secondary | ICD-10-CM | POA: Insufficient documentation

## 2011-09-13 NOTE — Progress Notes (Signed)
VASCULAR LAB PRELIMINARY  ARTERIAL  ABI completed: Bilateral normal ABI study    RIGHT    LEFT    PRESSURE WAVEFORM  PRESSURE WAVEFORM  BRACHIAL 126 tri BRACHIAL 125 tri  DP   DP    AT 150 tri AT 143 tri  PT 145 tri PT 138 tri  PER   PER    GREAT TOE  NA GREAT TOE  NA    RIGHT LEFT  ABI 1.19 1.13     EUNICE, Reice Bienvenue, RDMS 09/13/2011, 10:29 AM

## 2011-09-14 ENCOUNTER — Telehealth: Payer: Self-pay | Admitting: Licensed Clinical Social Worker

## 2011-09-14 NOTE — Telephone Encounter (Signed)
CSW received call from [name] regarding access to health care and financial concerns.  [name] is currently unemployed and has no income.  Pt states he purchased a Dietitian with [employer], but has limited if any coverage at all.  Pt states with paying insurance premiums and medication copays it is becoming a financial hardship.  CSW discussed alternative programs that are available, [employer] orange card and MAP program.  CSW discussed the necessary information needed to apply and the process of meeting with the Financial Counselor.  Pt aware [employer] orange card is not an insurance card and is not for those that are covered by another insurance program.  [name] to meet with financial counselor and is aware CSW is available to assist as needed.

## 2011-10-13 HISTORY — PX: OTHER SURGICAL HISTORY: SHX169

## 2011-10-18 ENCOUNTER — Encounter: Payer: Self-pay | Admitting: Internal Medicine

## 2011-10-18 ENCOUNTER — Ambulatory Visit (INDEPENDENT_AMBULATORY_CARE_PROVIDER_SITE_OTHER): Payer: PRIVATE HEALTH INSURANCE | Admitting: Internal Medicine

## 2011-10-18 VITALS — BP 114/71 | HR 73 | Temp 97.1°F | Ht 66.0 in | Wt 135.2 lb

## 2011-10-18 DIAGNOSIS — Z01818 Encounter for other preprocedural examination: Secondary | ICD-10-CM

## 2011-10-18 NOTE — Patient Instructions (Signed)
You were seen today to be cleared for surgery. We are going to clear you for surgery. We would like you to stop smoking altogether 2 weeks prior to surgery. It would be better if you stopped forever but stop at least 2 weeks after surgery for better healing. Do not take aspirin for 1 week before surgery if you take any over the counter. If you have any questions or problems please call our office. Our number is [phone].

## 2011-10-18 NOTE — Progress Notes (Signed)
Subjective:     Patient ID: Bill [name], male   DOB: 1960/07/19, 51 y.o.   MRN: [id number]  HPI The patient is a 51 year old male with past medical history of seizure disorder, avascular necrosis of the hip who comes in today for a clearance for his hip surgery. He is scheduled later this month had hip surgery. He is currently an everyday smoker of 3 cigarettes per day. He does not take aspirin for anything over-the-counter or prescribed. He is able to do all of his ADLs and was able to come into the office unassisted. He states that for longer distances usually uses crutches however is able to walk. He does not have any heart history, no heart attacks in the pack, no heart failure. He did have a normal stress in 2003. He is not having any chest pain nor does he have chest pain in the past. No other complaints at today's visit. No shortness of breath, nausea, vomiting, diarrhea.  Review of Systems  Constitutional: Negative.   HENT: Negative.   Eyes: Negative.   Respiratory: Negative.  Negative for cough, choking, chest tightness and shortness of breath.   Cardiovascular: Negative.  Negative for chest pain, palpitations and leg swelling.  Gastrointestinal: Negative.   Musculoskeletal: Positive for arthralgias. Negative for myalgias, back pain, joint swelling and gait problem.  Neurological: Negative.   Hematological: Negative for adenopathy. Does not bruise/bleed easily.       Objective:   Physical Exam  Constitutional: He is oriented to person, place, and time. He appears well-developed and well-nourished. No distress.  HENT:  Head: Normocephalic and atraumatic.  Eyes: EOM are normal. Pupils are equal, round, and reactive to light.  Neck: Normal range of motion. Neck supple.  Cardiovascular: Normal rate, regular rhythm and normal heart sounds.   No murmur heard. Pulmonary/Chest: Effort normal and breath sounds normal.  Abdominal: Soft. Bowel sounds are normal. He exhibits no  distension. There is no tenderness. There is no rebound.  Musculoskeletal: Normal range of motion. He exhibits tenderness.  Neurological: He is alert and oriented to person, place, and time.  Skin: Skin is warm and dry. No rash noted. He is not diaphoretic. No erythema. No pallor.  Psychiatric: He has a normal mood and affect. His behavior is normal. Judgment and thought content normal.       Assessment/Plan:   1. Medical clearance for surgery-the patient does have normal creatinine about 6 months ago and normal CBC about 3 months ago. He does not have indication for EKG as his Elonda Husky risk is low and his surgery is categorized as intermediate risk. Did advise him to stop smoking approximately 2 weeks prior to surgery and continued to stop smoking hopefully indefinitely however she does feel the need to start again to stop for at least 2 weeks after surgery. Advised him of cessation of aspirin if he does feel the need to use that about one week prior to surgery. Physical exam was normal no heart murmurs detected. He is able to perform all of his ADLs. Did recommend PT/OT evaluation after surgery.

## 2011-10-19 ENCOUNTER — Encounter (HOSPITAL_COMMUNITY): Payer: Self-pay | Admitting: Pharmacy Technician

## 2011-10-19 NOTE — Addendum Note (Signed)
Addended by: Neomia Dear on: 10/19/2011 06:23 PM   Modules accepted: Orders

## 2011-10-24 NOTE — H&P (Signed)
Bill [name] is an 52 y.o. male.    Chief Complaint:  Right hip pain, S/P right total hip arthroplasty, ASR components  HPI: Pt is a 51 y.o. male who had a previous right total hip arthroplasty, ASR, in 2007.  He states that he originally did well with the replacement, but in 2010 he started to have pain. It started off slowly and has progressively increased over the years, becoming really bad in March of 2013. X-rays in the clinic show previous total hip arthroplasty with ASR components. Various options are discussed with the patient. Risks, benefits and expectations were discussed with the patient. Patient understand the risks, benefits and expectations and wishes to proceed with surgery.   PCP:  Almyra Deforest, MD  D/C Plans:  Home with HHPT  Post-op Meds:  No Rx given   Tranexamic Acid:   To be given  Decadron:   To be given  PMH: Past Medical History  Diagnosis Date  . Seizure disorder     Started at age 28. Last EMR record 05/25/09  . History of tobacco abuse     Stopped in 2005  . Chronic chest pain     Normal stress test in 2003  . Pleural plaque     MULTIPLE BILATERAL PLEURAL PLAQUES, which had not changed on CT as of 1/04  . Avascular necrosis of bone of hip 2007    Right  . Alcohol abuse     Hx of , stopped in 2000. Had 12 pack for 25 years.   . Seizures   . Arthritis     PSH: Past Surgical History  Procedure Date  . Right hip replacement 8/07    Dr. Charlann Boxer    Social History:  reports that he has been smoking Cigarettes.  He has been smoking about .5 packs per day. He has never used smokeless tobacco. He reports that he does not drink alcohol or use illicit drugs.  Allergies:  No Known Allergies  Medications: No current facility-administered medications for this encounter.   Current Outpatient Prescriptions  Medication Sig Dispense Refill  . gabapentin (NEURONTIN) 600 MG tablet Take 1 tablet (600 mg total) by mouth 3 (three) times daily.  90 tablet  3   . phenytoin (DILANTIN) 100 MG ER capsule Take 2 capsules (200 mg total) by mouth 2 (two) times daily.  120 capsule  3    ROS: Review of Systems  Constitutional: Negative.   HENT: Negative.   Eyes: Negative.   Respiratory: Negative.   Cardiovascular: Negative.   Gastrointestinal: Negative.   Genitourinary: Negative.   Musculoskeletal: Positive for joint pain.  Skin: Negative.   Neurological: Negative.   Endo/Heme/Allergies: Negative.   Psychiatric/Behavioral: Negative.      Physical Exam: BP:   110/70  ;  HR:   72  ; Resp:   14  : Physical Exam  Constitutional: He is oriented to person, place, and time and well-developed, well-nourished, and in no distress.  HENT:  Head: Normocephalic and atraumatic.  Nose: Nose normal.  Mouth/Throat: Oropharynx is clear and moist.  Eyes: Pupils are equal, round, and reactive to light.  Neck: Neck supple. No JVD present. No tracheal deviation present. No thyromegaly present.  Cardiovascular: Normal rate, regular rhythm, normal heart sounds and intact distal pulses.   Pulmonary/Chest: Effort normal and breath sounds normal. No stridor. No respiratory distress. He has no wheezes.  Abdominal: Soft. There is no tenderness. There is no guarding.  Musculoskeletal:  Right hip: He exhibits decreased range of motion, decreased strength, tenderness and bony tenderness. He exhibits no swelling, no deformity and no laceration.  Lymphadenopathy:    He has no cervical adenopathy.  Neurological: He is alert and oriented to person, place, and time.  Skin: Skin is warm and dry.  Psychiatric: Affect normal.      Assessment/Plan Assessment:   Right hip pain, S/P right total hip arthroplasty, ASR components   Plan: Patient will undergo a right total hip revision on 11/01/2011 per Dr. Charlann Boxer at [facility]. Risks benefits and expectations were discussed with the patient. Patient understand risks, benefits and expectations and wishes to  proceed.   Anastasio Auerbach Yanelis Osika   PAC  10/24/2011, 9:51 PM

## 2011-10-26 ENCOUNTER — Encounter (HOSPITAL_COMMUNITY): Payer: Self-pay

## 2011-10-26 ENCOUNTER — Encounter (HOSPITAL_COMMUNITY)
Admission: RE | Admit: 2011-10-26 | Discharge: 2011-10-26 | Disposition: A | Discharge status: Home / Self Care | Payer: PRIVATE HEALTH INSURANCE | Source: Ambulatory Visit | Attending: Orthopedic Surgery | Admitting: Orthopedic Surgery

## 2011-10-26 LAB — CBC
HCT: 42.1 % (ref 39.0–52.0)
MCH: 28.7 pg (ref 26.0–34.0)
MCHC: 34.4 g/dL (ref 30.0–36.0)
MCV: 83.4 fL (ref 78.0–100.0)
RDW: 14.1 % (ref 11.5–15.5)
WBC: 6 10*3/uL (ref 4.0–10.5)

## 2011-10-26 LAB — BASIC METABOLIC PANEL
BUN: 16 mg/dL (ref 6–23)
CO2: 31 mEq/L (ref 19–32)
Chloride: 104 mEq/L (ref 96–112)
Creatinine, Ser: 0.79 mg/dL (ref 0.50–1.35)
Glucose, Bld: 85 mg/dL (ref 70–99)

## 2011-10-26 LAB — URINALYSIS, ROUTINE W REFLEX MICROSCOPIC
Glucose, UA: NEGATIVE mg/dL
Ketones, ur: NEGATIVE mg/dL
Leukocytes, UA: NEGATIVE
pH: 6.5 (ref 5.0–8.0)

## 2011-10-26 NOTE — Pre-Procedure Instructions (Addendum)
PT HAS A NOTE OF MEDICAL CLEARANCE FOR RIGHT TOTAL HIP ARTHROPLASTY REVISION FROM DR. KOLLAR-FAXED BY DR. Nilsa Nutting OFFICE AND PLACED ON PT'S CHART. CT CHEST REPORT IN EPIC FROM 06/04/11. CBC, BMET, PT, PTT, UA AND EKG WERE DONE PREOP TODAY AT [facility].  T/S IS TO BE DONE DAY OF SURGERY. PREOP INSTRUCTIONS DISCUSSED WITH PT USING THE TEACH BACK METHOD.

## 2011-10-26 NOTE — Patient Instructions (Signed)
YOUR SURGERY IS SCHEDULED ON:  Monday  8/26  AT  9:10 AM  REPORT TO [facility] SHORT STAY CENTER AT:  6:30 AM      PHONE # FOR SHORT STAY IS [phone]  DO NOT EAT OR DRINK ANYTHING AFTER MIDNIGHT THE NIGHT BEFORE YOUR SURGERY.  YOU MAY BRUSH YOUR TEETH, RINSE OUT YOUR MOUTH--BUT NO WATER, NO FOOD, NO CHEWING GUM, NO MINTS, NO CANDIES, NO CHEWING TOBACCO.  PLEASE TAKE THE FOLLOWING MEDICATIONS THE AM OF YOUR SURGERY WITH A FEW SIPS OF WATER:    DILANTIN    IF YOU USE INHALERS--USE YOUR INHALERS THE AM OF YOUR SURGERY AND BRING INHALERS TO THE HOSPITAL -TAKE TO SURGERY.    IF YOU ARE DIABETIC:  DO NOT TAKE ANY DIABETIC MEDICATIONS THE AM OF YOUR SURGERY.  IF YOU TAKE INSULIN IN THE EVENINGS--PLEASE ONLY TAKE 1/2 NORMAL EVENING DOSE THE NIGHT BEFORE YOUR SURGERY.  NO INSULIN THE AM OF YOUR SURGERY.  IF YOU HAVE SLEEP APNEA AND USE CPAP OR BIPAP--PLEASE BRING THE MASK --NOT THE MACHINE-NOT THE TUBING   -JUST THE MASK. DO NOT BRING VALUABLES, MONEY, CREDIT CARDS.  CONTACT LENS, DENTURES / PARTIALS, GLASSES SHOULD NOT BE WORN TO SURGERY AND IN MOST CASES-HEARING AIDS WILL NEED TO BE REMOVED.  BRING YOUR GLASSES CASE, ANY EQUIPMENT NEEDED FOR YOUR CONTACT LENS. FOR PATIENTS ADMITTED TO THE HOSPITAL--CHECK OUT TIME THE DAY OF DISCHARGE IS 11:00 AM.  ALL INPATIENT ROOMS ARE PRIVATE - WITH BATHROOM, TELEPHONE, TELEVISION AND WIFI INTERNET. IF YOU ARE BEING DISCHARGED THE SAME DAY OF YOUR SURGERY--YOU CAN NOT DRIVE YOURSELF HOME--AND SHOULD NOT GO HOME ALONE BY TAXI OR BUS.  NO DRIVING OR OPERATING MACHINERY FOR 24 HOURS FOLLOWING ANESTHESIA / PAIN MEDICATIONS.                            SPECIAL INSTRUCTIONS:  CHLORHEXIDINE SOAP SHOWER (other brand names are Betasept and Hibiclens ) PLEASE SHOWER WITH CHLORHEXIDINE THE NIGHT BEFORE YOUR SURGERY AND THE AM OF YOUR SURGERY. DO NOT USE CHLORHEXIDINE ON YOUR FACE OR PRIVATE AREAS--YOU MAY USE YOUR NORMAL SOAP THOSE AREAS AND YOUR NORMAL SHAMPOO.  WOMEN  SHOULD AVOID SHAVING UNDER ARMS AND SHAVING LEGS 48 HOURS BEFORE USING CHLORHEXIDINE TO AVOID SKIN IRRITATION.  DO NOT USE IF ALLERGIC TO CHLORHEXIDINE.  PLEASE READ OVER ANY  FACT SHEETS THAT YOU WERE GIVEN: MRSA INFORMATION, BLOOD TRANSFUSION INFORMATION, INCENTIVE SPIROMETER INFORMATION.

## 2011-11-01 ENCOUNTER — Encounter (HOSPITAL_COMMUNITY): Payer: Self-pay | Admitting: *Deleted

## 2011-11-01 ENCOUNTER — Ambulatory Visit (HOSPITAL_COMMUNITY): Payer: PRIVATE HEALTH INSURANCE | Admitting: Anesthesiology

## 2011-11-01 ENCOUNTER — Encounter (HOSPITAL_COMMUNITY): Payer: Self-pay | Admitting: Anesthesiology

## 2011-11-01 ENCOUNTER — Encounter (HOSPITAL_COMMUNITY)
Admission: RE | Disposition: A | Discharge status: Home / Self Care | Payer: Self-pay | Source: Ambulatory Visit | Attending: Orthopedic Surgery

## 2011-11-01 ENCOUNTER — Ambulatory Visit (HOSPITAL_COMMUNITY): Payer: PRIVATE HEALTH INSURANCE

## 2011-11-01 ENCOUNTER — Inpatient Hospital Stay (HOSPITAL_COMMUNITY)
Admission: RE | Admit: 2011-11-01 | Discharge: 2011-11-03 | DRG: 467 | Disposition: A | Discharge status: Home / Self Care | Payer: PRIVATE HEALTH INSURANCE | Source: Ambulatory Visit | Attending: Orthopedic Surgery | Admitting: Orthopedic Surgery

## 2011-11-01 DIAGNOSIS — M87059 Idiopathic aseptic necrosis of unspecified femur: Secondary | ICD-10-CM | POA: Diagnosis present

## 2011-11-01 DIAGNOSIS — R569 Unspecified convulsions: Secondary | ICD-10-CM

## 2011-11-01 DIAGNOSIS — T84039A Mechanical loosening of unspecified internal prosthetic joint, initial encounter: Secondary | ICD-10-CM | POA: Diagnosis present

## 2011-11-01 DIAGNOSIS — Z96649 Presence of unspecified artificial hip joint: Secondary | ICD-10-CM

## 2011-11-01 DIAGNOSIS — Y831 Surgical operation with implant of artificial internal device as the cause of abnormal reaction of the patient, or of later complication, without mention of misadventure at the time of the procedure: Secondary | ICD-10-CM | POA: Diagnosis present

## 2011-11-01 DIAGNOSIS — M129 Arthropathy, unspecified: Secondary | ICD-10-CM | POA: Diagnosis present

## 2011-11-01 DIAGNOSIS — T84099A Other mechanical complication of unspecified internal joint prosthesis, initial encounter: Principal | ICD-10-CM | POA: Diagnosis present

## 2011-11-01 DIAGNOSIS — F1011 Alcohol abuse, in remission: Secondary | ICD-10-CM | POA: Diagnosis present

## 2011-11-01 DIAGNOSIS — M25559 Pain in unspecified hip: Secondary | ICD-10-CM | POA: Diagnosis present

## 2011-11-01 DIAGNOSIS — G40909 Epilepsy, unspecified, not intractable, without status epilepticus: Secondary | ICD-10-CM | POA: Diagnosis present

## 2011-11-01 LAB — TYPE AND SCREEN
ABO/RH(D): O POS
Antibody Screen: NEGATIVE

## 2011-11-01 SURGERY — REVISION, TOTAL ARTHROPLASTY, HIP, ACETABULAR COMPONENT
Anesthesia: Spinal | Site: Hip | Laterality: Right | Wound class: Clean

## 2011-11-01 MED ORDER — POLYETHYLENE GLYCOL 3350 17 G PO PACK
17.0000 g | PACK | Freq: Two times a day (BID) | ORAL | Status: DC
  Administered 2011-11-01 – 2011-11-03 (×5): 17 g via ORAL

## 2011-11-01 MED ORDER — METHOCARBAMOL 500 MG PO TABS
500.0000 mg | ORAL_TABLET | Freq: Four times a day (QID) | ORAL | Status: DC | PRN
  Administered 2011-11-03: 500 mg via ORAL
  Filled 2011-11-01: qty 1

## 2011-11-01 MED ORDER — CEFAZOLIN SODIUM-DEXTROSE 2-3 GM-% IV SOLR
2.0000 g | INTRAVENOUS | Status: AC
  Administered 2011-11-01: 2 g via INTRAVENOUS

## 2011-11-01 MED ORDER — BUPIVACAINE HCL (PF) 0.5 % IJ SOLN
INTRAMUSCULAR | Status: DC | PRN
  Administered 2011-11-01: 3 mL

## 2011-11-01 MED ORDER — ONDANSETRON HCL 4 MG/2ML IJ SOLN
4.0000 mg | Freq: Four times a day (QID) | INTRAMUSCULAR | Status: DC | PRN
  Administered 2011-11-02: 4 mg via INTRAVENOUS
  Filled 2011-11-01: qty 2

## 2011-11-01 MED ORDER — BUPIVACAINE HCL (PF) 0.5 % IJ SOLN
INTRAMUSCULAR | Status: AC
  Filled 2011-11-01: qty 30

## 2011-11-01 MED ORDER — BISACODYL 10 MG RE SUPP: 10.0000 mg | Freq: Every day | RECTAL | Status: DC | PRN

## 2011-11-01 MED ORDER — FENTANYL CITRATE 0.05 MG/ML IJ SOLN
INTRAMUSCULAR | Status: DC | PRN
  Administered 2011-11-01: 50 ug via INTRAVENOUS

## 2011-11-01 MED ORDER — LACTATED RINGERS IV SOLN
INTRAVENOUS | Status: DC
  Administered 2011-11-01: 1000 mL via INTRAVENOUS

## 2011-11-01 MED ORDER — PHENYTOIN SODIUM EXTENDED 100 MG PO CAPS
200.0000 mg | ORAL_CAPSULE | Freq: Two times a day (BID) | ORAL | Status: DC
  Administered 2011-11-01 – 2011-11-03 (×5): 200 mg via ORAL
  Filled 2011-11-01 (×7): qty 2

## 2011-11-01 MED ORDER — HYDROCODONE-ACETAMINOPHEN 7.5-325 MG PO TABS
1.0000 | ORAL_TABLET | ORAL | Status: DC
  Administered 2011-11-01: 1 via ORAL
  Administered 2011-11-01: 2 via ORAL
  Administered 2011-11-01: 1 via ORAL
  Administered 2011-11-02 – 2011-11-03 (×10): 2 via ORAL
  Filled 2011-11-01 (×6): qty 2
  Filled 2011-11-01: qty 1
  Filled 2011-11-01 (×3): qty 2
  Filled 2011-11-01: qty 1
  Filled 2011-11-01 (×2): qty 2

## 2011-11-01 MED ORDER — CHLORHEXIDINE GLUCONATE 4 % EX LIQD
60.0000 mL | Freq: Once | CUTANEOUS | Status: DC
  Filled 2011-11-01: qty 60

## 2011-11-01 MED ORDER — MIDAZOLAM HCL 5 MG/5ML IJ SOLN
INTRAMUSCULAR | Status: DC | PRN
  Administered 2011-11-01: 2 mg via INTRAVENOUS

## 2011-11-01 MED ORDER — FERROUS SULFATE 325 (65 FE) MG PO TABS
325.0000 mg | ORAL_TABLET | Freq: Three times a day (TID) | ORAL | Status: DC
  Administered 2011-11-01 – 2011-11-03 (×7): 325 mg via ORAL
  Filled 2011-11-01 (×9): qty 1

## 2011-11-01 MED ORDER — PHENOL 1.4 % MT LIQD
1.0000 | OROMUCOSAL | Status: DC | PRN
  Filled 2011-11-01: qty 177

## 2011-11-01 MED ORDER — CEFAZOLIN SODIUM-DEXTROSE 2-3 GM-% IV SOLR
INTRAVENOUS | Status: AC
  Filled 2011-11-01: qty 50

## 2011-11-01 MED ORDER — ONDANSETRON HCL 4 MG PO TABS: 4.0000 mg | ORAL_TABLET | Freq: Four times a day (QID) | ORAL | Status: DC | PRN

## 2011-11-01 MED ORDER — DEXAMETHASONE SODIUM PHOSPHATE 10 MG/ML IJ SOLN
10.0000 mg | Freq: Once | INTRAMUSCULAR | Status: AC
  Administered 2011-11-01: 10 mg via INTRAVENOUS
  Filled 2011-11-01: qty 1

## 2011-11-01 MED ORDER — LACTATED RINGERS IV SOLN
INTRAVENOUS | Status: DC | PRN
  Administered 2011-11-01 (×3): via INTRAVENOUS

## 2011-11-01 MED ORDER — MEPERIDINE HCL 50 MG/ML IJ SOLN: 6.2500 mg | INTRAMUSCULAR | Status: DC | PRN

## 2011-11-01 MED ORDER — CELECOXIB 200 MG PO CAPS
200.0000 mg | ORAL_CAPSULE | Freq: Two times a day (BID) | ORAL | Status: DC
  Administered 2011-11-01 – 2011-11-03 (×5): 200 mg via ORAL
  Filled 2011-11-01 (×7): qty 1

## 2011-11-01 MED ORDER — EPHEDRINE SULFATE 50 MG/ML IJ SOLN
INTRAMUSCULAR | Status: DC | PRN
  Administered 2011-11-01 (×3): 10 mg via INTRAVENOUS

## 2011-11-01 MED ORDER — DIPHENHYDRAMINE HCL 25 MG PO CAPS: 25.0000 mg | ORAL_CAPSULE | Freq: Four times a day (QID) | ORAL | Status: DC | PRN

## 2011-11-01 MED ORDER — SODIUM CHLORIDE 0.9 % IV SOLN
100.0000 mL/h | INTRAVENOUS | Status: DC
Start: 2011-11-01 — End: 2011-11-03
  Administered 2011-11-01: 100 mL/h via INTRAVENOUS
  Filled 2011-11-01 (×6): qty 1000

## 2011-11-01 MED ORDER — HYDROMORPHONE HCL PF 1 MG/ML IJ SOLN: 0.2500 mg | INTRAMUSCULAR | Status: DC | PRN

## 2011-11-01 MED ORDER — ZOLPIDEM TARTRATE 5 MG PO TABS: 5.0000 mg | ORAL_TABLET | Freq: Every evening | ORAL | Status: DC | PRN

## 2011-11-01 MED ORDER — DOCUSATE SODIUM 100 MG PO CAPS
100.0000 mg | ORAL_CAPSULE | Freq: Two times a day (BID) | ORAL | Status: DC
  Administered 2011-11-01 – 2011-11-03 (×5): 100 mg via ORAL

## 2011-11-01 MED ORDER — PROMETHAZINE HCL 25 MG/ML IJ SOLN: 6.2500 mg | INTRAMUSCULAR | Status: DC | PRN

## 2011-11-01 MED ORDER — HYDROMORPHONE HCL PF 1 MG/ML IJ SOLN
0.5000 mg | INTRAMUSCULAR | Status: DC | PRN
  Administered 2011-11-01: 2 mg via INTRAVENOUS
  Administered 2011-11-01: 0.5 mg via INTRAVENOUS
  Administered 2011-11-01: 1 mg via INTRAVENOUS
  Filled 2011-11-01 (×2): qty 1
  Filled 2011-11-01: qty 2

## 2011-11-01 MED ORDER — GABAPENTIN 600 MG PO TABS
600.0000 mg | ORAL_TABLET | Freq: Three times a day (TID) | ORAL | Status: DC
  Filled 2011-11-01 (×2): qty 1

## 2011-11-01 MED ORDER — FLEET ENEMA 7-19 GM/118ML RE ENEM: 1.0000 | ENEMA | Freq: Once | RECTAL | Status: AC | PRN

## 2011-11-01 MED ORDER — METOCLOPRAMIDE HCL 5 MG/ML IJ SOLN
5.0000 mg | Freq: Three times a day (TID) | INTRAMUSCULAR | Status: DC | PRN
  Administered 2011-11-01: 10 mg via INTRAVENOUS
  Filled 2011-11-01: qty 2

## 2011-11-01 MED ORDER — NICOTINE 14 MG/24HR TD PT24
14.0000 mg | MEDICATED_PATCH | TRANSDERMAL | Status: DC
Start: 2011-11-01 — End: 2011-11-03
  Administered 2011-11-01 – 2011-11-02 (×2): 14 mg via TRANSDERMAL
  Filled 2011-11-01 (×3): qty 1

## 2011-11-01 MED ORDER — RIVAROXABAN 10 MG PO TABS
10.0000 mg | ORAL_TABLET | ORAL | Status: DC
  Administered 2011-11-02: 10 mg via ORAL
  Filled 2011-11-01 (×2): qty 1

## 2011-11-01 MED ORDER — CEFAZOLIN SODIUM-DEXTROSE 2-3 GM-% IV SOLR
2.0000 g | Freq: Four times a day (QID) | INTRAVENOUS | Status: AC
  Administered 2011-11-01 (×2): 2 g via INTRAVENOUS
  Filled 2011-11-01 (×3): qty 50

## 2011-11-01 MED ORDER — MENTHOL 3 MG MT LOZG
1.0000 | LOZENGE | OROMUCOSAL | Status: DC | PRN
  Filled 2011-11-01: qty 9

## 2011-11-01 MED ORDER — GABAPENTIN 300 MG PO CAPS
600.0000 mg | ORAL_CAPSULE | Freq: Three times a day (TID) | ORAL | Status: DC
  Administered 2011-11-01 – 2011-11-03 (×7): 600 mg via ORAL
  Filled 2011-11-01 (×9): qty 2

## 2011-11-01 MED ORDER — LACTATED RINGERS IV SOLN: INTRAVENOUS | Status: DC

## 2011-11-01 MED ORDER — ONDANSETRON HCL 4 MG/2ML IJ SOLN
INTRAMUSCULAR | Status: DC | PRN
  Administered 2011-11-01: 4 mg via INTRAVENOUS

## 2011-11-01 MED ORDER — METHOCARBAMOL 100 MG/ML IJ SOLN
500.0000 mg | Freq: Four times a day (QID) | INTRAVENOUS | Status: DC | PRN
  Administered 2011-11-01: 500 mg via INTRAVENOUS
  Filled 2011-11-01 (×2): qty 5

## 2011-11-01 MED ORDER — PHENYLEPHRINE HCL 10 MG/ML IJ SOLN
10.0000 mg | INTRAVENOUS | Status: DC | PRN
  Administered 2011-11-01: 20 ug/min via INTRAVENOUS

## 2011-11-01 MED ORDER — PROPOFOL INFUSION 10 MG/ML OPTIME
INTRAVENOUS | Status: DC | PRN
  Administered 2011-11-01: 75 ug/kg/min via INTRAVENOUS

## 2011-11-01 MED ORDER — PROPOFOL 10 MG/ML IV EMUL
INTRAVENOUS | Status: DC | PRN
  Administered 2011-11-01 (×2): 20 mg via INTRAVENOUS

## 2011-11-01 MED ORDER — 0.9 % SODIUM CHLORIDE (POUR BTL) OPTIME
TOPICAL | Status: DC | PRN
  Administered 2011-11-01: 1000 mL

## 2011-11-01 MED ORDER — TRANEXAMIC ACID 100 MG/ML IV SOLN
15.0000 mg/kg | Freq: Once | INTRAVENOUS | Status: AC
  Administered 2011-11-01: 918 mg via INTRAVENOUS
  Filled 2011-11-01: qty 9.18

## 2011-11-01 MED ORDER — HYDROMORPHONE HCL PF 1 MG/ML IJ SOLN
INTRAMUSCULAR | Status: AC
  Filled 2011-11-01: qty 1

## 2011-11-01 MED ORDER — ALUM & MAG HYDROXIDE-SIMETH 200-200-20 MG/5ML PO SUSP: 30.0000 mL | ORAL | Status: DC | PRN

## 2011-11-01 MED ORDER — METOCLOPRAMIDE HCL 10 MG PO TABS: 5.0000 mg | ORAL_TABLET | Freq: Three times a day (TID) | ORAL | Status: DC | PRN

## 2011-11-01 MED ORDER — DEXAMETHASONE SODIUM PHOSPHATE 10 MG/ML IJ SOLN
10.0000 mg | Freq: Once | INTRAMUSCULAR | Status: AC
  Administered 2011-11-02: 10 mg via INTRAVENOUS
  Filled 2011-11-01: qty 1

## 2011-11-01 MED ORDER — PROPOFOL 10 MG/ML IV EMUL
INTRAVENOUS | Status: DC | PRN
  Administered 2011-11-01: 100 ug/kg/min via INTRAVENOUS

## 2011-11-01 SURGICAL SUPPLY — 61 items
ADH SKN CLS APL DERMABOND .7 (GAUZE/BANDAGES/DRESSINGS) ×1
BAG SPEC THK2 15X12 ZIP CLS (MISCELLANEOUS) ×1
BAG ZIPLOCK 12X15 (MISCELLANEOUS) ×2 IMPLANT
BLADE SAW SGTL 18X1.27X75 (BLADE) ×1 IMPLANT
BRUSH FEMORAL CANAL (MISCELLANEOUS) IMPLANT
CLOTH BEACON ORANGE TIMEOUT ST (SAFETY) ×2 IMPLANT
CUP ACET PNNCL SECTR W/GRIP 56 (Hips) IMPLANT
DERMABOND ADVANCED (GAUZE/BANDAGES/DRESSINGS) ×1
DERMABOND ADVANCED .7 DNX12 (GAUZE/BANDAGES/DRESSINGS) ×1 IMPLANT
DRAPE INCISE IOBAN 85X60 (DRAPES) ×2 IMPLANT
DRAPE ORTHO SPLIT 77X108 STRL (DRAPES) ×4
DRAPE POUCH INSTRU U-SHP 10X18 (DRAPES) ×2 IMPLANT
DRAPE SURG 17X11 SM STRL (DRAPES) ×2 IMPLANT
DRAPE SURG ORHT 6 SPLT 77X108 (DRAPES) ×2 IMPLANT
DRAPE U-SHAPE 47X51 STRL (DRAPES) ×2 IMPLANT
DRSG AQUACEL AG ADV 3.5X10 (GAUZE/BANDAGES/DRESSINGS) ×2 IMPLANT
DRSG AQUACEL AG ADV 3.5X14 (GAUZE/BANDAGES/DRESSINGS) ×1 IMPLANT
DRSG EMULSION OIL 3X16 NADH (GAUZE/BANDAGES/DRESSINGS) ×1 IMPLANT
DRSG TEGADERM 4X4.75 (GAUZE/BANDAGES/DRESSINGS) ×2 IMPLANT
DURAPREP 26ML APPLICATOR (WOUND CARE) ×2 IMPLANT
ELECT BLADE TIP CTD 4 INCH (ELECTRODE) ×2 IMPLANT
ELECT REM PT RETURN 9FT ADLT (ELECTROSURGICAL) ×2
ELECTRODE REM PT RTRN 9FT ADLT (ELECTROSURGICAL) ×1 IMPLANT
ELIMINATOR HOLE APEX DEPUY (Hips) ×1 IMPLANT
EVACUATOR 1/8 PVC DRAIN (DRAIN) ×2 IMPLANT
FACESHIELD LNG OPTICON STERILE (SAFETY) ×8 IMPLANT
GAUZE SPONGE 2X2 8PLY STRL LF (GAUZE/BANDAGES/DRESSINGS) ×1 IMPLANT
GLOVE BIOGEL PI IND STRL 7.5 (GLOVE) ×1 IMPLANT
GLOVE BIOGEL PI IND STRL 8 (GLOVE) ×1 IMPLANT
GLOVE BIOGEL PI INDICATOR 7.5 (GLOVE) ×1
GLOVE BIOGEL PI INDICATOR 8 (GLOVE) ×1
GLOVE ORTHO TXT STRL SZ7.5 (GLOVE) ×4 IMPLANT
GLOVE SURG ORTHO 8.0 STRL STRW (GLOVE) ×2 IMPLANT
GOWN BRE IMP PREV XXLGXLNG (GOWN DISPOSABLE) ×4 IMPLANT
GOWN STRL NON-REIN LRG LVL3 (GOWN DISPOSABLE) ×2 IMPLANT
HANDPIECE INTERPULSE COAX TIP (DISPOSABLE)
HEAD FEM BIOLOX DELTA 36 8.5 (Orthopedic Implant) ×1 IMPLANT
KIT BASIN OR (CUSTOM PROCEDURE TRAY) ×2 IMPLANT
MANIFOLD NEPTUNE II (INSTRUMENTS) ×2 IMPLANT
NS IRRIG 1000ML POUR BTL (IV SOLUTION) ×3 IMPLANT
PACK TOTAL JOINT (CUSTOM PROCEDURE TRAY) ×2 IMPLANT
PINN SECTOR W/GRIP ACE CUP 56 (Hips) ×2 IMPLANT
PINNACLE ALTRX PLUS 4 N 36X56 (Hips) ×1 IMPLANT
POSITIONER SURGICAL ARM (MISCELLANEOUS) ×2 IMPLANT
PRESSURIZER FEMORAL UNIV (MISCELLANEOUS) IMPLANT
SCREW 6.5MMX25MM (Screw) ×1 IMPLANT
SCREW 6.5MMX30MM (Screw) ×1 IMPLANT
SET HNDPC FAN SPRY TIP SCT (DISPOSABLE) IMPLANT
SPONGE GAUZE 2X2 STER 10/PKG (GAUZE/BANDAGES/DRESSINGS) ×1
SPONGE LAP 18X18 X RAY DECT (DISPOSABLE) ×1 IMPLANT
SPONGE LAP 4X18 X RAY DECT (DISPOSABLE) ×1 IMPLANT
STAPLER VISISTAT 35W (STAPLE) ×1 IMPLANT
SUCTION FRAZIER TIP 10 FR DISP (SUCTIONS) ×2 IMPLANT
SUT VIC AB 1 CT1 36 (SUTURE) ×4 IMPLANT
SUT VIC AB 2-0 CT1 27 (SUTURE) ×6
SUT VIC AB 2-0 CT1 TAPERPNT 27 (SUTURE) ×3 IMPLANT
SUT VLOC 180 0 24IN GS25 (SUTURE) ×3 IMPLANT
TOWEL OR 17X26 10 PK STRL BLUE (TOWEL DISPOSABLE) ×4 IMPLANT
TOWER CARTRIDGE SMART MIX (DISPOSABLE) IMPLANT
TRAY FOLEY CATH 14FRSI W/METER (CATHETERS) ×2 IMPLANT
WATER STERILE IRR 1500ML POUR (IV SOLUTION) ×2 IMPLANT

## 2011-11-01 NOTE — Anesthesia Postprocedure Evaluation (Signed)
  Anesthesia Post-op Note  Patient: Bill [name]  Procedure(s) Performed: Procedure(s) (LRB): ACETABULAR REVISION (Right)  Patient Location: PACU  Anesthesia Type: Spinal  Level of Consciousness: awake and alert   Airway and Oxygen Therapy: Patient Spontanous Breathing  Post-op Pain: mild  Post-op Assessment: Post-op Vital signs reviewed, Patient's Cardiovascular Status Stable, Respiratory Function Stable, Patent Airway and No signs of Nausea or vomiting  Post-op Vital Signs: stable  Complications: No apparent anesthesia complications

## 2011-11-01 NOTE — Brief Op Note (Signed)
11/01/2011  [facility]  MRN: [id number] CSN: [id number]  11:48 AM  PATIENT:  Bill [name]  51 y.o. male  PRE-OPERATIVE DIAGNOSIS:  Failed Right Total Hip, ASR acetabular component  POST-OPERATIVE DIAGNOSIS:  Failed Right Total Hip, ASR acetabular component  PROCEDURE:  Procedure(s) (LRB): ACETABULAR REVISION (Right)  SURGEON:  Surgeon(s) and Role:    * Shelda Pal, MD - Primary  PHYSICIAN ASSISTANT: Lanney Gins, PA-C  ANESTHESIA:   general  EBL:  Total I/O In: 2000 [I.V.:2000] Out: 500 [Urine:200; Blood:300]  BLOOD ADMINISTERED:none  DRAINS: (1 medium) Hemovact drain(s) in the right hip with  Suction Open   LOCAL MEDICATIONS USED:  NONE  SPECIMEN:  No Specimen  DISPOSITION OF SPECIMEN:  N/A  COUNTS:  YES  TOURNIQUET:  * No tourniquets in log *  DICTATION: .Other Dictation: Dictation Number [id number]  PLAN OF CARE: Admit to inpatient   PATIENT DISPOSITION:  PACU - hemodynamically stable.   Delay start of Pharmacological VTE agent (>24hrs) due to surgical blood loss or risk of bleeding: no

## 2011-11-01 NOTE — Transfer of Care (Signed)
Immediate Anesthesia Transfer of Care Note  Patient: Bill [name]  Procedure(s) Performed: Procedure(s) (LRB): ACETABULAR REVISION (Right)  Patient Location: PACU  Anesthesia Type: MAC and Spinal  Level of Consciousness: awake, alert , oriented and patient cooperative  Airway & Oxygen Therapy: Patient Spontanous Breathing and Patient connected to face mask oxygen  Post-op Assessment: Report given to PACU RN and Post -op Vital signs reviewed and stable  Post vital signs: Reviewed and stable  Complications: No apparent anesthesia complications

## 2011-11-01 NOTE — Anesthesia Preprocedure Evaluation (Addendum)
Anesthesia Evaluation  Patient identified by MRN, date of birth, ID band Patient awake    Reviewed: Allergy & Precautions, H&P , NPO status , Patient's Chart, lab work & pertinent test results  Airway Mallampati: II TM Distance: >3 FB Neck ROM: Full    Dental No notable dental hx. (+) Partial Upper   Pulmonary neg pulmonary ROS, former smoker,  breath sounds clear to auscultation  Pulmonary exam normal       Cardiovascular negative cardio ROS  Rhythm:Regular Rate:Normal     Neuro/Psych Seizures -, Well Controlled,  negative neurological ROS  negative psych ROS   GI/Hepatic negative GI ROS, Neg liver ROS,   Endo/Other  negative endocrine ROS  Renal/GU negative Renal ROS  negative genitourinary   Musculoskeletal negative musculoskeletal ROS (+)   Abdominal   Peds negative pediatric ROS (+)  Hematology negative hematology ROS (+)   Anesthesia Other Findings   Reproductive/Obstetrics negative OB ROS                          Anesthesia Physical Anesthesia Plan  ASA: III  Anesthesia Plan: Spinal   Post-op Pain Management:    Induction:   Airway Management Planned: Simple Face Mask  Additional Equipment:   Intra-op Plan:   Post-operative Plan:   Informed Consent: I have reviewed the patients History and Physical, chart, labs and discussed the procedure including the risks, benefits and alternatives for the proposed anesthesia with the patient or authorized representative who has indicated his/her understanding and acceptance.   Dental advisory given  Plan Discussed with: CRNA  Anesthesia Plan Comments:         Anesthesia Quick Evaluation

## 2011-11-01 NOTE — Preoperative (Signed)
Beta Blockers   Reason not to administer Beta Blockers:Not Applicable 

## 2011-11-01 NOTE — Anesthesia Procedure Notes (Signed)
Spinal  Patient location during procedure: OR Staffing Anesthesiologist: Zhyon Antenucci Performed by: anesthesiologist  Preanesthetic Checklist Completed: patient identified, site marked, surgical consent, pre-op evaluation, timeout performed, IV checked, risks and benefits discussed and monitors and equipment checked Spinal Block Patient position: sitting Prep: Betadine Patient monitoring: heart rate, continuous pulse ox and blood pressure Approach: right paramedian Location: L2-3 Injection technique: single-shot Needle Needle type: Spinocan  Needle gauge: 22 G Needle length: 9 cm Additional Notes Expiration date of kit checked and confirmed. Patient tolerated procedure well, without complications.     

## 2011-11-01 NOTE — Interval H&P Note (Signed)
History and Physical Interval Note:  11/01/2011 9:15 AM  Bill [name]  has presented today for surgery, with the diagnosis of Failed Right Total Hip  The various methods of treatment have been discussed with the patient and family. After consideration of risks, benefits and other options for treatment, the patient has consented to  Procedure(s) (LRB): RIGHT TOTAL HIP REVISION (Right) as a surgical intervention .  The patient's history has been reviewed, patient examined, no change in status, stable for surgery.  I have reviewed the patient's chart and labs.  Questions were answered to the patient's satisfaction.     Shelda Pal

## 2011-11-02 LAB — CBC
MCV: 82.4 fL (ref 78.0–100.0)
Platelets: 195 10*3/uL (ref 150–400)
RBC: 4.08 MIL/uL — ABNORMAL LOW (ref 4.22–5.81)
WBC: 7.5 10*3/uL (ref 4.0–10.5)

## 2011-11-02 LAB — BASIC METABOLIC PANEL
CO2: 28 mEq/L (ref 19–32)
Chloride: 100 mEq/L (ref 96–112)
Creatinine, Ser: 0.72 mg/dL (ref 0.50–1.35)
GFR calc Af Amer: >90 mL/min (ref >90)
Potassium: 3.9 mEq/L (ref 3.5–5.1)

## 2011-11-02 MED ORDER — ENOXAPARIN SODIUM 40 MG/0.4ML ~~LOC~~ SOLN
40.0000 mg | SUBCUTANEOUS | Status: DC
  Administered 2011-11-03: 40 mg via SUBCUTANEOUS
  Filled 2011-11-02 (×2): qty 0.4

## 2011-11-02 NOTE — Progress Notes (Signed)
Utilization review completed.  

## 2011-11-02 NOTE — Progress Notes (Signed)
Physical Therapy Treatment Patient Details Name: Bill [name] MRN: [id number] DOB: 1960-11-24 Today's Date: 11/02/2011 Time: 2956-2130 PT Time Calculation (min): 11 min  PT Assessment / Plan / Recommendation Comments on Treatment Session       Follow Up Recommendations  Home health PT    Barriers to Discharge        Equipment Recommendations  None recommended by PT    Recommendations for Other Services OT consult  Frequency 7X/week   Plan Discharge plan remains appropriate    Precautions / Restrictions Precautions Precautions: Posterior Hip Precaution Comments: Able to recall 3/3 hip precautions Restrictions Weight Bearing Restrictions: No Other Position/Activity Restrictions: WBAT   Pertinent Vitals/Pain Min c/o pain    Mobility  Bed Mobility Bed Mobility: Supine to Sit Supine to Sit: 4: Min assist Details for Bed Mobility Assistance: cues for sequence, use of L LE to self assist and adherence to THP Transfers Transfers: Sit to Stand;Stand to Sit Sit to Stand: 4: Min assist Stand to Sit: 4: Min guard Details for Transfer Assistance: cues for LE management and use of UEs to self assist Ambulation/Gait Ambulation/Gait Assistance: 4: Min guard Ambulation Distance (Feet): 170 Feet Assistive device: Rolling walker Ambulation/Gait Assistance Details: min cues for posture, position from RW and ER on R Gait Pattern: Step-to pattern;Step-through pattern    Exercises Total Joint Exercises Ankle Circles/Pumps: AROM;10 reps;Both;Supine Quad Sets: AROM;10 reps;Supine;Both Heel Slides: AAROM;10 reps;Supine;Right Hip ABduction/ADduction: AAROM;10 reps;Supine;Right   PT Diagnosis: Difficulty walking  PT Problem List: Decreased strength;Decreased range of motion;Decreased activity tolerance;Decreased mobility;Pain;Decreased knowledge of use of DME PT Treatment Interventions: DME instruction;Gait training;Stair training;Functional mobility training;Therapeutic  activities;Therapeutic exercise;Patient/family education   PT Goals Acute Rehab PT Goals PT Goal Formulation: With patient Time For Goal Achievement: 11/08/11 Potential to Achieve Goals: Good Pt will go Supine/Side to Sit: with supervision PT Goal: Supine/Side to Sit - Progress: Goal set today Pt will go Sit to Supine/Side: with supervision PT Goal: Sit to Supine/Side - Progress: Goal set today Pt will go Sit to Stand: with supervision PT Goal: Sit to Stand - Progress: Progressing toward goal Pt will go Stand to Sit: with supervision PT Goal: Stand to Sit - Progress: Progressing toward goal Pt will Ambulate: >150 feet;with supervision;with rolling walker PT Goal: Ambulate - Progress: Progressing toward goal Pt will Go Up / Down Stairs: 1-2 stairs;with min assist;with least restrictive assistive device PT Goal: Up/Down Stairs - Progress: Goal set today  Visit Information  Last PT Received On: 11/02/11 Assistance Needed: +1    Subjective Data  Subjective: This feels better than before surgery - I can put weight on it for the first time in a long time Patient Stated Goal: walk without crutches   Cognition  Overall Cognitive Status: Appears within functional limits for tasks assessed/performed Arousal/Alertness: Awake/alert Orientation Level: Appears intact for tasks assessed Behavior During Session: [facility] for tasks performed    Balance     End of Session PT - End of Session Activity Tolerance: Patient tolerated treatment well Patient left: in chair;with call bell/phone within reach Nurse Communication: Mobility status   GP     Angeliki Mates 11/02/2011, 2:48 PM

## 2011-11-02 NOTE — Evaluation (Signed)
Occupational Therapy Evaluation Patient Details Name: Bill [name] MRN: [id number] DOB: October 24, 1960 Today's Date: 11/02/2011 Time: 5366-4403 OT Time Calculation (min): 23 min  OT Assessment / Plan / Recommendation Clinical Impression  Pt is a 51 yo male who presents POD 1 R THR revision. All education completed. Pt will have necessary level of A from family upon d/c.    OT Assessment  Patient does not need any further OT services    Follow Up Recommendations  No OT follow up    Barriers to Discharge      Equipment Recommendations  None recommended by OT    Recommendations for Other Services    Frequency       Precautions / Restrictions Precautions Precautions: Posterior Hip Precaution Comments: Able to recall 3/3 hip precautions Restrictions Weight Bearing Restrictions: No Other Position/Activity Restrictions: WBAT   Pertinent Vitals/Pain Reported 2/10 discomfort in R hip. Repositioned for comfort.    ADL  Grooming: Simulated;Supervision/safety Where Assessed - Grooming: Supported standing Upper Body Bathing: Simulated;Set up Where Assessed - Upper Body Bathing: Unsupported sitting Lower Body Bathing: Simulated;Set up Where Assessed - Lower Body Bathing: Supported sit to stand Upper Body Dressing: Simulated;Set up Where Assessed - Upper Body Dressing: Unsupported sitting Lower Body Dressing: Performed;Set up Where Assessed - Lower Body Dressing: Supported sit to stand Toilet Transfer: Research scientist (life sciences) Method: Sit to Barista: Raised toilet seat with arms (or 3-in-1 over toilet) Toileting - Clothing Manipulation and Hygiene: Simulated;Supervision/safety Where Assessed - Engineer, mining and Hygiene: Sit to stand from 3-in-1 or toilet Tub/Shower Transfer:  (Pt stated he would be spongebathing.) Transfers/Ambulation Related to ADLs: Pt ambulated to the bathroom with minguard A/supervision. ADL  Comments: Educated pt how to utilize all necessary AE with good return demo.    OT Diagnosis:    OT Problem List:   OT Treatment Interventions:     OT Goals    Visit Information  Last OT Received On: 11/02/11 Assistance Needed: +1    Subjective Data  Subjective: I love that sock thing!! Patient Stated Goal: Not asked   Prior Functioning  Vision/Perception  Home Living Lives With: Family Available Help at Discharge: Family Type of Home: House Home Access: Stairs to enter Secretary/administrator of Steps: 1 Entrance Stairs-Rails: None Home Layout: One level Bathroom Shower/Tub: Engineer, manufacturing systems: Standard Home Adaptive Equipment: Bedside commode/3-in-1;Walker - rolling;Reacher Prior Function Level of Independence: Independent with assistive device(s) Able to Take Stairs?: Yes Driving: No Vocation: Full time employment Communication Communication: No difficulties Dominant Hand: Right      Cognition  Overall Cognitive Status: Appears within functional limits for tasks assessed/performed Arousal/Alertness: Awake/alert Orientation Level: Appears intact for tasks assessed Behavior During Session: [facility] for tasks performed    Extremity/Trunk Assessment Right Upper Extremity Assessment RUE ROM/Strength/Tone: [facility] for tasks assessed Left Upper Extremity Assessment LUE ROM/Strength/Tone: WFL for tasks assessed    Mobility  Transfers Sit to Stand: 4: Min guard;With upper extremity assist;5: Supervision;With armrests;From chair/3-in-1 Stand to Sit: 4: Min guard;5: Supervision;With upper extremity assist;To chair/3-in-1;With armrests Details for Transfer Assistance: min VCs for hand placement and RLE management.   Exercise   Balance    End of Session OT - End of Session Activity Tolerance: Patient tolerated treatment well Patient left: in chair;with call bell/phone within reach  GO     Bill [name] A OTR/L [phone] 11/02/2011, 2:51 PM

## 2011-11-02 NOTE — Op Note (Signed)
NAMERIKER, COLLIER NO.:  [id number]  MEDICAL RECORD NO.:  [id number]  LOCATION:  1613                         FACILITY:  [facility]  PHYSICIAN:  Madlyn Frankel. Charlann Boxer, M.D.  DATE OF BIRTH:  04-Aug-1960  DATE OF PROCEDURE:  11/01/2011 DATE OF DISCHARGE:                              OPERATIVE REPORT   PREOPERATIVE DIAGNOSIS:  Failed right total hip replacement, loose acetabular component, history of a previously placed DePuy ASR hip.  POSTOPERATIVE DIAGNOSIS:  Failed right total hip replacement, loose acetabular component, history of a previously placed DePuy ASR hip.  FINDINGS:  The patient have no signs of infection.  Acetabular cup had no evidence of bony ingrowth as it was obviously loose confirming radiographic presentation.  PROCEDURE:  Revision of right total hip replacement with a 56 Gription sector pinnacle shell with a 36 +4 neutral AltrX liner, 2 cancellous bone screws and a single hole eliminator and a 36 +8.5, TS delta ceramic ball, with internal liner adapter.  SURGEON:  Madlyn Frankel. Charlann Boxer, MD  ASSISTANT:  Lanney Gins, PA.  Note, Mr. Carmon Sails was present for the entirety of the case utilized for management of the operative extremity, general facilitation of the case primary wound closure.  ANESTHESIA:  General.  SPECIMENS:  None.  COMPLICATION:  None apparent.  DRAINS:  One medium Hemovac.  BLOOD LOSS:  Less than 200 mL.  INDICATION FOR THE PROCEDURE:  Mr. Kincaid is 51 year old male with history of a right total hip replacement with an ASR hip.  He had just recently followed up in our office with one of my partners for increasing pain in his right hip.  Radiographs revealed an obviously loose acetabular component with significant shifting in this orientation and position.  He was subsequently referred for surgical management.  He is seen and evaluated in the office and we discussed options.  After reviewing hip revision surgery, he at this point,  wished to proceed.  Preoperative workup and clinical concerns were negative for infection. Risk of infection, DVT, component failure, lack of bony ingrowth were all reviewed and discussed in the preoperative state.  PROCEDURE IN DETAIL:  The patient was brought to operative theater. Once adequate anesthesia preoperative antibiotics, Ancef administered, the patient was positioned to the left lateral decubitus position with the right side up.  The right lower extremity was then prepped and draped in a sterile fashion.  The older incision was identified and marked out on his skin, extended slightly proximal and distal for exposure purposes.  A time-out was performed.  This posterolateral approach to the hip was carried out.  Soft tissue dissection was carried to the iliotibial band and gluteal fascia.  These were then incised posteriorly.  Once the gluteus maximus was split and the posterior aspect of the hip exposed, I encountered a slightly serosanguineous-type synovial fluid, but no signs of purulence, no signs of significant metal-stained fluid.  He did have some metal-staining synovitis which was debrided, but no evidence of any muscle damage or trauma.  Once I exposed the posterior aspect of the hip by debridement purposes, we dislocated the hip, removed the femoral head.  Then based on his anatomy, I was not able  to fully place his trunnion onto the ilium and thus the femur was retracted anteriorly, acetabular shell was removed without difficulty.  Following debridement of the acetabular region, I began reaming with a smaller reamer, 47 reamer as the previously removed cup was a 52 mm.  I did this to just remove some of the soft tissues around the acetabulum. I reamed up to 55 mm reamer.  His bone quality was poor, indicating the long standing motion of this acetabular component with a sclerotic bony bed.  There was no real cancellous bone present.  Nonetheless, there was bony  punctate bleeding within the sclerotic area.  For this reason, I chose to use the DePuy Gription revision cup to enhance the chances of bony ingrowth.  This 56 Gription pinnacle cup was then impacted at approximately 40 degrees of abduction, 20 degrees of forward flexion.  I then placed 2 cancellous bone screws into the ilium with excellent purchase further supporting the initial scratch fit.  36 +4 Neutral AltrX liner was then impacted in place.  At this point, the trial reduction initially with 36 +5 and then a 36 +8.5 ball,  felt leg lengths and stability of the hip were better with the 8.5.  For this reason, we chose 36 +8.5, capital TS revision ball and to be placed on the trunnion, it was then impacted onto a clean and dry trunnion.  The hip reduced taking care to protect the ceramic surface.  The hip was reduced.  The hip was re-irrigated with normal saline solution.  I was able to reapproximate some of the posterior pseudocapsule back to itself.  I placed a medium Hemovac drain deep. The iliotibial band and gluteal fascia were then reapproximated using a combination of #1 Vicryl in a 0 V-Loc suture.  The remainder of the wound was closed with 2-0 Vicryl and running 4-0 Monocryl.  The hip was cleaned, dried, and dressed sterilely using an Aquacel dressing.  Drain site dressed separately.  He was then extubated and brought to the recovery room in stable condition, tolerating the procedure well.  He will be weightbearing as tolerated.  We will see him back in the office and routine follow up for wound check after routine hospital stay projected at 2 days, Lindwood Qua, PA.     Madlyn Frankel Charlann Boxer, M.D.    MDO/MEDQ  D:  11/01/2011  T:  11/02/2011  Job:  [id number]

## 2011-11-02 NOTE — Progress Notes (Signed)
   Subjective: 1 Day Post-Op Procedure(s) (LRB): ACETABULAR REVISION (Right)   Patient reports pain as mild, pain well controlled. No events throughout the night.  Objective:   VITALS:   Filed Vitals:   11/02/11 1152  BP: 95/59  Pulse: 61  Temp: 98.3 F (36.8 C)  Resp: 16    Neurovascular intact Dorsiflexion/Plantar flexion intact Incision: dressing C/D/I No cellulitis present Compartment soft  LABS  Basename 11/02/11 0400  HGB 11.4*  HCT 33.6*  WBC 7.5  PLT 195     Basename 11/02/11 0400  NA 136  K 3.9  BUN 8  CREATININE 0.72  GLUCOSE 131*     Assessment/Plan: 1 Day Post-Op Procedure(s) (LRB): ACETABULAR REVISION (Right)   HV drain d/c'ed Foley cath d/c'ed Advance diet Up with therapy D/C IV fluids Plan for discharge tomorrow to home if continues to progress well.   Anastasio Auerbach Bryar Rennie   PAC  11/02/2011, 12:44 PM

## 2011-11-02 NOTE — Progress Notes (Signed)
PT recommended HHPT services, pt does not have that benefit with his insurance plan and cannot afford to pay out of pocket. Physical therapist Durene Cal informed me that pt is doing well and would be able to do outpatient therapy. Checked on his benefits for this and was informed they pay $25.00 per session. Pt made aware of this. Pt stated he will be staying with his brothers and will arrange for someone to take him back and forth for his therapy sessions. He stated that [facility] on [facility] [address] is close to him and plans to go there. [employer] PA made aware of this.

## 2011-11-02 NOTE — Evaluation (Signed)
Physical Therapy Evaluation Patient Details Name: Bill [name] MRN: [id number] DOB: 11-15-1960 Today's Date: 11/02/2011 Time: 1100-1130 PT Time Calculation (min): 30 min  PT Assessment / Plan / Recommendation Clinical Impression  Pt s/p R THR revision presents with decreased R LE strength/ROM and post THP limiting functional mobility    PT Assessment  Patient needs continued PT services    Follow Up Recommendations  Home health PT    Barriers to Discharge        Equipment Recommendations  None recommended by PT    Recommendations for Other Services OT consult   Frequency 7X/week    Precautions / Restrictions Precautions Precautions: Posterior Hip Precaution Comments: sign hung in room Restrictions Weight Bearing Restrictions: No Other Position/Activity Restrictions: WBAT   Pertinent Vitals/Pain 4/10; pt premedicated      Mobility  Bed Mobility Bed Mobility: Supine to Sit Supine to Sit: 4: Min assist Details for Bed Mobility Assistance: cues for sequence, use of L LE to self assist and adherence to THP Transfers Transfers: Sit to Stand;Stand to Sit Sit to Stand: 4: Min assist Stand to Sit: 4: Min assist Details for Transfer Assistance: cues for LE management and use of UEs to self assist Ambulation/Gait Ambulation/Gait Assistance: 4: Min assist Ambulation Distance (Feet): 111 Feet Assistive device: Rolling walker Ambulation/Gait Assistance Details: cues for sequence, position from RW, posture, and ER on R Gait Pattern: Step-to pattern;Step-through pattern    Exercises Total Joint Exercises Ankle Circles/Pumps: AROM;10 reps;Both;Supine Quad Sets: AROM;10 reps;Supine;Both Heel Slides: AAROM;10 reps;Supine;Right Hip ABduction/ADduction: AAROM;10 reps;Supine;Right   PT Diagnosis: Difficulty walking  PT Problem List: Decreased strength;Decreased range of motion;Decreased activity tolerance;Decreased mobility;Pain;Decreased knowledge of use of DME PT Treatment  Interventions: DME instruction;Gait training;Stair training;Functional mobility training;Therapeutic activities;Therapeutic exercise;Patient/family education   PT Goals Acute Rehab PT Goals PT Goal Formulation: With patient Time For Goal Achievement: 11/08/11 Potential to Achieve Goals: Good Pt will go Supine/Side to Sit: with supervision PT Goal: Supine/Side to Sit - Progress: Goal set today Pt will go Sit to Supine/Side: with supervision PT Goal: Sit to Supine/Side - Progress: Goal set today Pt will go Sit to Stand: with supervision PT Goal: Sit to Stand - Progress: Goal set today Pt will go Stand to Sit: with supervision PT Goal: Stand to Sit - Progress: Goal set today Pt will Ambulate: >150 feet;with supervision;with rolling walker PT Goal: Ambulate - Progress: Goal set today Pt will Go Up / Down Stairs: 1-2 stairs;with min assist;with least restrictive assistive device PT Goal: Up/Down Stairs - Progress: Goal set today  Visit Information  Last PT Received On: 11/02/11 Assistance Needed: +1    Subjective Data  Subjective: This feels better than before surgery - I can put weight on it for the first time in a long time Patient Stated Goal: walk without crutches   Prior Functioning  Home Living Lives With: Family Available Help at Discharge: Family Type of Home: House Home Access: Stairs to enter Secretary/administrator of Steps: 1 Entrance Stairs-Rails: None Home Layout: One level Home Adaptive Equipment: Walker - rolling;Bedside commode/3-in-1 Prior Function Level of Independence: Independent with assistive device(s) Able to Take Stairs?: Yes Communication Communication: No difficulties    Cognition  Overall Cognitive Status: Appears within functional limits for tasks assessed/performed Arousal/Alertness: Awake/alert Orientation Level: Appears intact for tasks assessed Behavior During Session: [facility] for tasks performed    Extremity/Trunk Assessment Right Upper  Extremity Assessment RUE ROM/Strength/Tone: [facility] for tasks assessed Left Upper Extremity Assessment LUE ROM/Strength/Tone: [facility] for  tasks assessed Right Lower Extremity Assessment RLE ROM/Strength/Tone: Deficits RLE ROM/Strength/Tone Deficits: hip strength 3-/5 with ROM WFL following THP Left Lower Extremity Assessment LLE ROM/Strength/Tone: WFL for tasks assessed   Balance    End of Session PT - End of Session Activity Tolerance: Patient tolerated treatment well Patient left: in chair;with call bell/phone within reach Nurse Communication: Mobility status  GP     Jurrell Royster 11/02/2011, 12:27 PM

## 2011-11-03 LAB — BASIC METABOLIC PANEL
CO2: 26 mEq/L (ref 19–32)
Calcium: 8.1 mg/dL — ABNORMAL LOW (ref 8.4–10.5)
Chloride: 104 mEq/L (ref 96–112)
Potassium: 3.6 mEq/L (ref 3.5–5.1)
Sodium: 137 mEq/L (ref 135–145)

## 2011-11-03 LAB — CBC
HCT: 29.3 % — ABNORMAL LOW (ref 39.0–52.0)
Hemoglobin: 10.3 g/dL — ABNORMAL LOW (ref 13.0–17.0)
MCH: 28.5 pg (ref 26.0–34.0)
Platelets: 190 10*3/uL (ref 150–400)

## 2011-11-03 MED ORDER — ENOXAPARIN SODIUM 40 MG/0.4ML ~~LOC~~ SOLN: 40.0000 mg | SUBCUTANEOUS | Status: DC

## 2011-11-03 MED ORDER — FERROUS SULFATE 325 (65 FE) MG PO TABS: 325.0000 mg | ORAL_TABLET | Freq: Three times a day (TID) | ORAL | Status: DC

## 2011-11-03 MED ORDER — HYDROCODONE-ACETAMINOPHEN 7.5-325 MG PO TABS: 1.0000 | ORAL_TABLET | ORAL | Status: AC | PRN

## 2011-11-03 MED ORDER — ASPIRIN EC 325 MG PO TBEC: 325.0000 mg | DELAYED_RELEASE_TABLET | Freq: Two times a day (BID) | ORAL | Status: AC

## 2011-11-03 MED ORDER — DSS 100 MG PO CAPS: 100.0000 mg | ORAL_CAPSULE | Freq: Two times a day (BID) | ORAL | Status: AC

## 2011-11-03 MED ORDER — METHOCARBAMOL 500 MG PO TABS: 500.0000 mg | ORAL_TABLET | Freq: Four times a day (QID) | ORAL | Status: AC | PRN

## 2011-11-03 MED ORDER — DIPHENHYDRAMINE HCL 25 MG PO CAPS: 25.0000 mg | ORAL_CAPSULE | Freq: Four times a day (QID) | ORAL | Status: DC | PRN

## 2011-11-03 MED ORDER — POLYETHYLENE GLYCOL 3350 17 G PO PACK: 17.0000 g | PACK | Freq: Two times a day (BID) | ORAL | Status: AC

## 2011-11-03 MED ORDER — ASPIRIN EC 325 MG PO TBEC: 325.0000 mg | DELAYED_RELEASE_TABLET | Freq: Two times a day (BID) | ORAL | Status: DC

## 2011-11-03 NOTE — Progress Notes (Signed)
   Subjective: 2 Days Post-Op Procedure(s) (LRB): ACETABULAR REVISION (Right)   Patient reports pain as mild, pain well controlled. No events throughout the night. Ready to be discharged home.  Objective:   VITALS:   Filed Vitals:   11/03/11  BP: 117/60  Pulse: 63  Temp: 97.9 F (36.6 C)   Resp: 14    Neurovascular intact Dorsiflexion/Plantar flexion intact Incision: dressing C/D/I No cellulitis present Compartment soft  LABS  Basename 11/03/11 0357 11/02/11 0400  HGB 10.3* 11.4*  HCT 29.3* 33.6*  WBC 8.1 7.5  PLT 190 195     Basename 11/03/11 0357 11/02/11 0400  NA 137 136  K 3.6 3.9  BUN 11 8  CREATININE 0.78 0.72  GLUCOSE 97 131*     Assessment/Plan: 2 Days Post-Op Procedure(s) (LRB): ACETABULAR REVISION (Right)   Up with therapy Discharge home today after PT Follow up in 2 weeks at [facility].  Follow-up Information    Follow up with OLIN,Robbert Langlinais D in 2 weeks.   Contact information:   [facility] [address] [place] [address] [phone]           Anastasio Auerbach. Marinell Igarashi   PAC  11/03/2011, 4:09 PM

## 2011-11-03 NOTE — Progress Notes (Signed)
Physical Therapy Treatment Patient Details Name: Bill [name] MRN: [id number] DOB: Nov 13, 1960 Today's Date: 11/03/2011 Time: 6295-2841 PT Time Calculation (min): 18 min  PT Assessment / Plan / Recommendation Comments on Treatment Session  POD# 2 pm session, practiced stairs. Pt recalls 3/3 THP and plans to D/C to his brothers house.    Follow Up Recommendations  Home health PT    Barriers to Discharge        Equipment Recommendations  None recommended by PT;None recommended by OT    Recommendations for Other Services    Frequency 7X/week   Plan Discharge plan remains appropriate    Precautions / Restrictions Precautions Precautions: Posterior Hip Restrictions Weight Bearing Restrictions: No Other Position/Activity Restrictions: WBAT    Pertinent Vitals/Pain C/o "soreness"   Mobility  Bed Mobility Bed Mobility: Not assessed Supine to Sit: 4: Min guard Details for Bed Mobility Assistance: Pt OOB in recliner  Transfers Transfers: Sit to Stand;Stand to Sit Sit to Stand: 5: Supervision;From chair/3-in-1 Stand to Sit: 5: Supervision;To chair/3-in-1 Details for Transfer Assistance: increased time  Ambulation/Gait Ambulation/Gait Assistance: 5: Supervision Ambulation Distance (Feet): 225 Feet Assistive device: Rolling walker Ambulation/Gait Assistance Details: one VC on backward gait sequencing Gait Pattern: Step-through pattern;Decreased stance time - right Gait velocity: decreased  Stairs: Yes Stairs Assistance: 4: Min guard Stair Management Technique: One rail Right;Forwards Number of Stairs: 2     PT Goals                       progressing    Visit Information  Last PT Received On: 11/03/11 Assistance Needed: +1               Balance   good  End of Session PT - End of Session Equipment Utilized During Treatment: Gait belt Activity Tolerance: Patient tolerated treatment well Patient left: in chair;with call bell/phone within reach Nurse  Communication: Other (comment) (Pt ready for D/C to brother's home)  Felecia Shelling  PTA [facility]  Acute  Rehab Pager     [phone]

## 2011-11-04 NOTE — Progress Notes (Signed)
Discharge summary sent to payer through [employer]  

## 2011-11-05 ENCOUNTER — Encounter (HOSPITAL_COMMUNITY): Payer: Self-pay

## 2011-11-05 NOTE — Discharge Summary (Signed)
Physician Discharge Summary  Patient ID: Bill [name] MRN: [id number] DOB/AGE: 51-Apr-1962 51 y.o.  Admit date: 11/01/2011 Discharge date: 11/03/2011  Procedures:  Procedure(s) (LRB): ACETABULAR REVISION (Right)  Attending Physician:  Dr. Durene Romans   Admission Diagnoses:   Right hip pain, S/P right total hip arthroplasty, ASR components  Discharge Diagnoses:  Principal Problem:  *S/P right hip revision Seizure disorder  Chronic chest pain   Pleural plaque  Avascular necrosis of bone of hip - right   Alcohol abuse   Arthritis  HPI: Pt is a 51 y.o. male who had a previous right total hip arthroplasty, ASR, in 2007. He states that he originally did well with the replacement, but in 2010 he started to have pain. It started off slowly and has progressively increased over the years, becoming really bad in March of 2013. X-rays in the clinic show previous total hip arthroplasty with ASR components. Various options are discussed with the patient. Risks, benefits and expectations were discussed with the patient. Patient understand the risks, benefits and expectations and wishes to proceed with surgery.   PCP: Almyra Deforest, MD   Discharged Condition: good  Hospital Course:  Patient underwent the above stated procedure on 11/01/2011. Patient tolerated the procedure well and brought to the recovery room in good condition and subsequently to the floor.  POD #1 BP: 95/59 ; Pulse: 61 ; Temp: 98.3 F (36.8 C) ; Resp: 16  Pt's foley was removed, as well as the hemovac drain removed. IV was changed to a saline lock. Patient reports pain as mild, pain well controlled. No events throughout the night. Neurovascular intact, dorsiflexion/plantar flexion intact, incision: dressing C/D/I, no cellulitis present and compartment soft.   LABS  Basename  11/02/11 0400  HGB  11.4  HCT  33.6   POD #2  BP: 117/60 ; Pulse: 63 ; Temp: 97.9 F (36.6 C) ; Resp: 14 Patient reports pain as mild, pain  well controlled. No events throughout the night. Ready to be discharged home. Neurovascular intact, dorsiflexion/plantar flexion intact, incision: dressing C/D/I, no cellulitis present and compartment soft.   LABS  Basename  11/03/11 0357   HGB  10.3  HCT  29.3    Discharge Exam: General appearance: alert, cooperative and no distress Extremities: Homans sign is negative, no sign of DVT, no edema, redness or tenderness in the calves or thighs and no ulcers, gangrene or trophic changes  Disposition:  Home or Self Care with follow up in 2 weeks   Follow-up Information    Follow up with Shelda Pal, MD. Schedule an appointment as soon as possible for a visit in 2 weeks.   Contact information:   [facility] [address], [address] [place] [address] [phone]          Discharge Orders    Future Appointments: Provider: Department: Dept Phone: Center:   11/15/2011 11:30 AM Ileene Hutchinson, PT [address] [phone] [facility]     Future Orders Please Complete By Expires   Diet - low sodium heart healthy      Call MD / Call 911      Comments:   If you experience chest pain or shortness of breath, CALL 911 and be transported to the hospital emergency room.  If you develope a fever above 101 F, pus (white drainage) or increased drainage or redness at the wound, or calf pain, call your surgeon's office.   Discharge instructions      Comments:  Maintain surgical dressing for 8 days, then replace with gauze and tape. Keep the area dry and clean until follow up. Follow up in 2 weeks at [facility]. Call with any questions or concerns.   Constipation Prevention      Comments:   Drink plenty of fluids.  Prune juice may be helpful.  You may use a stool softener, such as Colace (over the counter) 100 mg twice a day.  Use MiraLax (over the counter) for constipation as needed.   Increase activity slowly as tolerated      Change dressing       Comments:   Maintain surgical dressing for 8 days, then replace with 4x4 guaze and tape. Keep the area dry and clean.   TED hose      Comments:   Use stockings (TED hose) for 2 weeks on both leg(s).  You may remove them at night for sleeping.      Discharge Medication List as of 11/03/2011  3:33 PM    START taking these medications   Details  diphenhydrAMINE (BENADRYL) 25 mg capsule Take 1 capsule (25 mg total) by mouth every 6 (six) hours as needed for itching, allergies or sleep., Starting 11/03/2011, Until Sat 11/13/11, No Print    docusate sodium 100 MG CAPS Take 100 mg by mouth 2 (two) times daily., Starting 11/03/2011, Until Sat 11/13/11, No Print    ferrous sulfate 325 (65 FE) MG tablet Take 1 tablet (325 mg total) by mouth 3 (three) times daily after meals., Starting 11/03/2011, Until Thu 11/02/12, No Print    HYDROcodone-acetaminophen (NORCO) 7.5-325 MG per tablet Take 1-2 tablets by mouth every 4 (four) hours as needed for pain., Starting 11/03/2011, Until Sat 11/13/11, Print    methocarbamol (ROBAXIN) 500 MG tablet Take 1 tablet (500 mg total) by mouth every 6 (six) hours as needed (muscle spasms)., Starting 11/03/2011, Until Sat 11/13/11, Print    polyethylene glycol (MIRALAX / GLYCOLAX) packet Take 17 g by mouth 2 (two) times daily., Starting 11/03/2011, Until Sat 11/06/11, No Print    aspirin EC 325 MG tablet Take 1 tablet (325 mg total) by mouth 2 (two) times daily. X 4 weeks, Starting 11/03/2011, Until Sat 11/13/11, No Print    enoxaparin (LOVENOX) 40 MG/0.4ML injection Inject 0.4 mLs (40 mg total) into the skin daily., Starting 11/03/2011, Until Discontinued, Print      CONTINUE these medications which have NOT CHANGED   Details  phenytoin (DILANTIN) 100 MG ER capsule Take 2 capsules (200 mg total) by mouth 2 (two) times daily., Starting 02/26/2011, Until Sat 02/26/12, Normal    gabapentin (NEURONTIN) 600 MG tablet Take 600 mg by mouth 3 (three) times daily. PT STATES HE NO LONGER TAKES  THIS MEDICATION-IT WAS GIVEN FOR NERVE PAIN-BUT NOW HAS FOUND  OUT HIS PAIN IS RELATED TO PROBLEMS WITH HIS PREVIOUS HIP REPLACEMENT , Starting 06/29/2011, Until Wed 06/28/12, Histori cal Med         Signed: Anastasio Auerbach. Luella Gardenhire   PAC  11/05/2011, 3:41 PM

## 2011-11-15 ENCOUNTER — Ambulatory Visit
Payer: PRIVATE HEALTH INSURANCE | Attending: Orthopedic Surgery | Admitting: Rehabilitative and Restorative Service Providers"

## 2011-11-15 DIAGNOSIS — M25559 Pain in unspecified hip: Secondary | ICD-10-CM | POA: Insufficient documentation

## 2011-11-15 DIAGNOSIS — M6281 Muscle weakness (generalized): Secondary | ICD-10-CM | POA: Insufficient documentation

## 2011-11-15 DIAGNOSIS — IMO0001 Reserved for inherently not codable concepts without codable children: Secondary | ICD-10-CM | POA: Insufficient documentation

## 2011-11-15 DIAGNOSIS — M25659 Stiffness of unspecified hip, not elsewhere classified: Secondary | ICD-10-CM | POA: Insufficient documentation

## 2011-11-23 ENCOUNTER — Ambulatory Visit: Payer: PRIVATE HEALTH INSURANCE | Admitting: Physical Therapy

## 2011-11-25 ENCOUNTER — Ambulatory Visit: Payer: PRIVATE HEALTH INSURANCE | Admitting: Physical Therapy

## 2011-11-30 ENCOUNTER — Ambulatory Visit: Payer: PRIVATE HEALTH INSURANCE | Admitting: Physical Therapy

## 2011-12-02 ENCOUNTER — Ambulatory Visit: Payer: PRIVATE HEALTH INSURANCE | Admitting: Physical Therapy

## 2011-12-07 ENCOUNTER — Encounter: Payer: PRIVATE HEALTH INSURANCE | Admitting: Rehabilitation

## 2011-12-08 ENCOUNTER — Ambulatory Visit: Payer: PRIVATE HEALTH INSURANCE | Attending: Orthopedic Surgery | Admitting: Rehabilitation

## 2011-12-08 DIAGNOSIS — M25559 Pain in unspecified hip: Secondary | ICD-10-CM | POA: Insufficient documentation

## 2011-12-08 DIAGNOSIS — IMO0001 Reserved for inherently not codable concepts without codable children: Secondary | ICD-10-CM | POA: Insufficient documentation

## 2011-12-08 DIAGNOSIS — M25659 Stiffness of unspecified hip, not elsewhere classified: Secondary | ICD-10-CM | POA: Insufficient documentation

## 2011-12-08 DIAGNOSIS — M6281 Muscle weakness (generalized): Secondary | ICD-10-CM | POA: Insufficient documentation

## 2011-12-09 ENCOUNTER — Ambulatory Visit: Payer: PRIVATE HEALTH INSURANCE | Admitting: Rehabilitation

## 2011-12-09 ENCOUNTER — Encounter: Payer: PRIVATE HEALTH INSURANCE | Admitting: Rehabilitation

## 2011-12-14 ENCOUNTER — Ambulatory Visit: Payer: PRIVATE HEALTH INSURANCE | Admitting: Physical Therapy

## 2011-12-16 ENCOUNTER — Ambulatory Visit: Payer: PRIVATE HEALTH INSURANCE | Admitting: Rehabilitation

## 2011-12-21 ENCOUNTER — Ambulatory Visit: Payer: PRIVATE HEALTH INSURANCE | Admitting: Physical Therapy

## 2011-12-21 ENCOUNTER — Encounter: Payer: PRIVATE HEALTH INSURANCE | Admitting: Rehabilitative and Restorative Service Providers"

## 2011-12-23 ENCOUNTER — Ambulatory Visit: Payer: PRIVATE HEALTH INSURANCE | Admitting: Rehabilitative and Restorative Service Providers"

## 2011-12-28 ENCOUNTER — Encounter: Payer: PRIVATE HEALTH INSURANCE | Admitting: Rehabilitative and Restorative Service Providers"

## 2011-12-30 ENCOUNTER — Ambulatory Visit: Payer: PRIVATE HEALTH INSURANCE | Admitting: Rehabilitative and Restorative Service Providers"

## 2012-01-03 ENCOUNTER — Other Ambulatory Visit (HOSPITAL_COMMUNITY): Payer: Self-pay | Admitting: Orthopedic Surgery

## 2012-01-03 DIAGNOSIS — M25561 Pain in right knee: Secondary | ICD-10-CM

## 2012-01-04 ENCOUNTER — Ambulatory Visit: Payer: PRIVATE HEALTH INSURANCE

## 2012-01-05 ENCOUNTER — Ambulatory Visit (HOSPITAL_COMMUNITY)
Admission: RE | Admit: 2012-01-05 | Discharge: 2012-01-05 | Disposition: A | Discharge status: Home / Self Care | Payer: Worker's Compensation | Source: Ambulatory Visit | Attending: Orthopedic Surgery | Admitting: Orthopedic Surgery

## 2012-01-05 DIAGNOSIS — S83289A Other tear of lateral meniscus, current injury, unspecified knee, initial encounter: Secondary | ICD-10-CM | POA: Insufficient documentation

## 2012-01-05 DIAGNOSIS — X58XXXA Exposure to other specified factors, initial encounter: Secondary | ICD-10-CM | POA: Insufficient documentation

## 2012-01-05 DIAGNOSIS — M25561 Pain in right knee: Secondary | ICD-10-CM

## 2012-01-06 ENCOUNTER — Ambulatory Visit: Payer: PRIVATE HEALTH INSURANCE | Admitting: Rehabilitation

## 2012-01-11 ENCOUNTER — Encounter: Payer: PRIVATE HEALTH INSURANCE | Admitting: Rehabilitation

## 2012-01-13 ENCOUNTER — Encounter: Payer: PRIVATE HEALTH INSURANCE | Admitting: Rehabilitation

## 2012-02-18 ENCOUNTER — Other Ambulatory Visit: Payer: Self-pay | Admitting: Internal Medicine

## 2012-02-21 ENCOUNTER — Encounter: Payer: Self-pay | Admitting: Internal Medicine

## 2012-02-21 ENCOUNTER — Ambulatory Visit (INDEPENDENT_AMBULATORY_CARE_PROVIDER_SITE_OTHER): Payer: Self-pay | Admitting: Internal Medicine

## 2012-02-21 VITALS — BP 115/76 | HR 70 | Temp 97.8°F | Ht 66.0 in | Wt 140.8 lb

## 2012-02-21 DIAGNOSIS — F17200 Nicotine dependence, unspecified, uncomplicated: Secondary | ICD-10-CM

## 2012-02-21 DIAGNOSIS — R9389 Abnormal findings on diagnostic imaging of other specified body structures: Secondary | ICD-10-CM

## 2012-02-21 DIAGNOSIS — Z23 Encounter for immunization: Secondary | ICD-10-CM

## 2012-02-21 DIAGNOSIS — D649 Anemia, unspecified: Secondary | ICD-10-CM

## 2012-02-21 DIAGNOSIS — Z599 Problem related to housing and economic circumstances, unspecified: Secondary | ICD-10-CM

## 2012-02-21 DIAGNOSIS — Z5989 Other problems related to housing and economic circumstances: Secondary | ICD-10-CM

## 2012-02-21 DIAGNOSIS — Z720 Tobacco use: Secondary | ICD-10-CM

## 2012-02-21 DIAGNOSIS — R930 Abnormal findings on diagnostic imaging of skull and head, not elsewhere classified: Secondary | ICD-10-CM

## 2012-02-21 DIAGNOSIS — R569 Unspecified convulsions: Secondary | ICD-10-CM

## 2012-02-21 DIAGNOSIS — Z598 Other problems related to housing and economic circumstances: Secondary | ICD-10-CM

## 2012-02-21 LAB — COMPREHENSIVE METABOLIC PANEL
ALT: 17 U/L (ref 0–53)
Alkaline Phosphatase: 119 U/L — ABNORMAL HIGH (ref 39–117)
CO2: 27 mEq/L (ref 19–32)
Calcium: 9.1 mg/dL (ref 8.4–10.5)
Chloride: 101 mEq/L (ref 96–112)
Sodium: 138 mEq/L (ref 135–145)
Total Bilirubin: 0.3 mg/dL (ref 0.3–1.2)
Total Protein: 7.4 g/dL (ref 6.0–8.3)

## 2012-02-21 LAB — CBC
MCH: 28.9 pg (ref 26.0–34.0)
MCV: 82.5 fL (ref 78.0–100.0)
Platelets: 244 10*3/uL (ref 150–400)
RDW: 13.8 % (ref 11.5–15.5)
WBC: 5.8 10*3/uL (ref 4.0–10.5)

## 2012-02-21 MED ORDER — PHENYTOIN SODIUM EXTENDED 100 MG PO CAPS: 200.0000 mg | ORAL_CAPSULE | Freq: Two times a day (BID) | ORAL | Status: DC

## 2012-02-21 NOTE — Assessment & Plan Note (Signed)
Patient had some kind of insurance but was not able to pay his pain am therefore his insurance is currently on hold. He will try to obtain an orange card.

## 2012-02-21 NOTE — Assessment & Plan Note (Signed)
Will obtain CBC today. Was recommended to take ferrous sulfate in the past but due to financial restraint he has not been taking any medication except Dilantin.

## 2012-02-21 NOTE — Assessment & Plan Note (Signed)
A repeat CT chest in March of this year was obtained and it was recommended to follow up in 6 month to review  bilateral pulmonary parenchymal nodules. I will obtain a CT chest today.

## 2012-02-21 NOTE — Assessment & Plan Note (Addendum)
The patient reports and no seizure activity since her last office visit. Patient reports that he had difficulties to obtain Dilantin. He had to pay $75 for a month supply. Therefore okay she may he worked skip a day and would not take his medication. Considering patient's no seizure activity since June 2013 will refill his medication. Patient was given a prescription and in he may be able to receive some medication from the [facility] health department for low cost.  I will obtain Dilantin level as well as complete metabolic panel today.

## 2012-02-21 NOTE — Progress Notes (Signed)
Subjective:   Patient ID: Bill [name] male   DOB: 1960-10-19 51 y.o.   MRN: [id number]  HPI: [name] is a 51 y.o. male with past medical history significant as outlined below who presented to the clinic for regular office visit. He reports he just wanted to refill his medications. He has no complaints today.    Past Medical History  Diagnosis Date  . Seizure disorder     Started at age 72. Last EMR record 05/25/09  . History of tobacco abuse   . Chronic chest pain     Normal stress test in 2003  . Pleural plaque     MULTIPLE BILATERAL PLEURAL PLAQUES, which had not changed on CT as of 1/04  . Avascular necrosis of bone of hip 2007    Right  . Alcohol abuse     Hx of , stopped in 2000. Had 12 pack for 25 years.   . Seizures     PT THINKS GRAND MAL  - LAST TIME PT THINKS WAS 2011  . Arthritis     S/P RIGHT TOTAL HIP ARTHROPLASTY 2007 --PT HAS NOTICED PAIN RIGHT LEG--IS TOLD HE NEEDS RT HIP REVISION   Current Outpatient Prescriptions  Medication Sig Dispense Refill  . phenytoin (DILANTIN) 100 MG ER capsule Take 2 capsules (200 mg total) by mouth 2 (two) times daily.  120 capsule  1   Family History  Problem Relation Age of Onset  . Heart attack Father   . Arthritis Mother   . Colon cancer Neg Hx    History   Social History  . Marital Status: Single    Spouse Name: N/A    Number of Children: N/A  . Years of Education: N/A   Social History Main Topics  . Smoking status: Current Every Day Smoker -- 0.5 packs/day for 20 years    Types: Cigarettes  . Smokeless tobacco: Never Used     Comment: TRYING TO QUIT  . Alcohol Use: No     Comment: Former  . Drug Use: No  . Sexually Active: Yes    Birth Control/ Protection: Condom     Comment: 1 Partner,    Other Topics Concern  . Not on file   Social History Narrative   Custodian, single.    Review of Systems: Constitutional: Denies fever, chills, diaphoresis, appetite change and fatigue.   Respiratory:  Denies SOB, DOE, cough, chest tightness,  and wheezing.   Cardiovascular: Denies chest pain, palpitations and leg swelling.  Gastrointestinal: Denies nausea, vomiting, abdominal pain, diarrhea, constipation, blood in stool and abdominal distention.  Genitourinary: Denies dysuria, urgency, frequency, hematuria, flank pain and difficulty urinating.  Musculoskeletal: Denies myalgias, back pain, joint swelling, arthralgias and gait problem. .  Neurological: Denies dizziness, seizures, syncope, weakness, light-headedness, numbness and headaches.    Objective:  Physical Exam: Filed Vitals:   02/21/12 1443  BP: 115/76  Pulse: 70  Temp: 97.8 F (36.6 C)  TempSrc: Oral  Height: 5\' 6"  (1.676 m)  Weight: 140 lb 12.8 oz (63.866 kg)  SpO2: 98%   Constitutional: Vital signs reviewed.  Patient is a well-developed and well-nourished in no acute distress and cooperative with exam. Alert and oriented x3.  Neck: Supple,  Cardiovascular: RRR, S1 normal, S2 normal, no MRG, pulses symmetric and intact bilaterally Pulmonary/Chest: CTAB, no wheezes, rales, or rhonchi Abdominal: Soft. Non-tender, non-distended, bowel sounds are normal Hematology: no cervical, inginal, or axillary adenopathy.  Neurological: A&O x3, no focal deficits

## 2012-02-21 NOTE — Assessment & Plan Note (Signed)
Continues to smoke but is trying to quit.  

## 2012-02-22 LAB — PHENYTOIN LEVEL, TOTAL: Phenytoin Lvl: 5.2 ug/mL — ABNORMAL LOW (ref 10.0–20.0)

## 2012-07-29 ENCOUNTER — Other Ambulatory Visit: Payer: Self-pay | Admitting: Internal Medicine

## 2012-08-01 NOTE — Telephone Encounter (Signed)
Will need an appointment for more refills.

## 2012-08-15 ENCOUNTER — Ambulatory Visit (INDEPENDENT_AMBULATORY_CARE_PROVIDER_SITE_OTHER): Payer: PRIVATE HEALTH INSURANCE | Admitting: Internal Medicine

## 2012-08-15 ENCOUNTER — Encounter: Payer: Self-pay | Admitting: Internal Medicine

## 2012-08-15 ENCOUNTER — Ambulatory Visit: Payer: Self-pay

## 2012-08-15 VITALS — BP 120/66 | HR 54 | Temp 97.2°F | Ht 66.5 in | Wt 142.9 lb

## 2012-08-15 DIAGNOSIS — Z720 Tobacco use: Secondary | ICD-10-CM

## 2012-08-15 DIAGNOSIS — R930 Abnormal findings on diagnostic imaging of skull and head, not elsewhere classified: Secondary | ICD-10-CM

## 2012-08-15 DIAGNOSIS — N529 Male erectile dysfunction, unspecified: Secondary | ICD-10-CM

## 2012-08-15 DIAGNOSIS — R569 Unspecified convulsions: Secondary | ICD-10-CM

## 2012-08-15 DIAGNOSIS — F17200 Nicotine dependence, unspecified, uncomplicated: Secondary | ICD-10-CM

## 2012-08-15 MED ORDER — PHENYTOIN SODIUM EXTENDED 200 MG PO CAPS: 200.0000 mg | ORAL_CAPSULE | Freq: Two times a day (BID) | ORAL | Status: DC

## 2012-08-15 MED ORDER — NICOTINE 14 MG/24HR TD PT24: 1.0000 | MEDICATED_PATCH | TRANSDERMAL | Status: DC

## 2012-08-15 MED ORDER — NICOTINE 7 MG/24HR TD PT24: 1.0000 | MEDICATED_PATCH | TRANSDERMAL | Status: DC

## 2012-08-15 MED ORDER — NICOTINE 21 MG/24HR TD PT24: 1.0000 | MEDICATED_PATCH | TRANSDERMAL | Status: DC

## 2012-08-15 NOTE — Assessment & Plan Note (Signed)
The patient notes symptoms of ED.  Risk factors include age and smoking history.  We discussed medications for treatment, including viagra.  We also discussed how, long-term, smoking can increase your risk of developing ED.  The patient defers medication treatment for now, but would like to re-discuss in the future.

## 2012-08-15 NOTE — Assessment & Plan Note (Signed)
History of seizure disorder, stable, last seizure 2-3 years ago.  Prior dilantin levels have been subtherapeutic, though patient admits to trying to "stretch" his medication due to the cost of the medications.  We discussed potential referral to neurology, for consideration of tapering off of his AED's, but patient prefers to continue this medication, noting seizures with medication non-compliance in the past. -continue depakote.  Kept dose the same, but changed from the 100 mg tabs to the 200 mg tabs for ease of dosing

## 2012-08-15 NOTE — Assessment & Plan Note (Signed)
The patient had CT chest 05/2011 with nodules, seen on prior imaging studies.  Per prior plan, patient was to follow-up with CT scans at 6, 12, and 24 months, but has not had his first follow-up.  Patient agrees today to CT scan. -will schedule CT scan now -based on results, likely repeat in 1 year

## 2012-08-15 NOTE — Progress Notes (Signed)
HPI The patient is a 52 y.o. male with a history of seizure disorder and tobacco abuse, presenting for a follow-up visit.  The patient has a history of seizures, diagnosed at age 32.  His first episode occurred while watching TV.  He was put on dilantin at that time, and has been on that medication since that time.  The patient notes a few seizures since that time, last occuring 2-3 years ago, in the setting of medication non-compliance.  His seizures manifest as tonic-clonic seizures.  The patient presents for refill of this medication.  The patient has a history of tobacco use, 1/2 ppd since he was a teenager.  The patient previously quit for 8 years, but relapsed 1 year ago due to the loss of a close friend.  When the pateint quit the first time, he used the nicotine patch.  The patient has been thinking about quitting, and would like to try quitting.  He is interested in trying the patch.  The patient has a history of lung nodules, seen on CT in 2002 and 2013.  It was recommended that the patient repeat the CT scan in 02/2012 but he did not.  He is interested in repeating this CT scan at this time.  The patient notes symptoms of erectile dysfunction, which have come on gradually over the last few years.  He notes difficulty achieving erection, and difficulty maintaining an erection.  He has a history of smoking.  ROS: General: no fevers, chills, changes in weight, changes in appetite Skin: no rash HEENT: no blurry vision, hearing changes, sore throat Pulm: no dyspnea, coughing, wheezing CV: no chest pain, palpitations, shortness of breath Abd: no abdominal pain, nausea/vomiting, diarrhea/constipation GU: no dysuria, hematuria, polyuria Ext: no arthralgias, myalgias Neuro: no weakness, numbness, or tingling  Filed Vitals:   08/15/12 1021  BP: 120/66  Pulse: 54  Temp: 97.2 F (36.2 C)    PEX General: alert, cooperative, and in no apparent distress HEENT: pupils equal round and  reactive to light, vision grossly intact, oropharynx clear and non-erythematous  Neck: supple, no lymphadenopathy Lungs: clear to ascultation bilaterally, normal work of respiration, no wheezes, rales, ronchi Heart: regular rate and rhythm, no murmurs, gallops, or rubs Abdomen: soft, non-tender, non-distended, normal bowel sounds Extremities: no cyanosis, clubbing, or edema Neurologic: alert & oriented X3, cranial nerves II-XII intact, strength grossly intact, sensation intact to light touch  Current Outpatient Prescriptions on File Prior to Visit  Medication Sig Dispense Refill  . DILANTIN 100 MG ER capsule TAKE 2 CAPSULES BY MOUTH TWICE DAILY  120 capsule  0   No current facility-administered medications on file prior to visit.    Assessment/Plan

## 2012-08-15 NOTE — Assessment & Plan Note (Signed)
  Assessment: Progress toward smoking cessation:  smoking the same amount Barriers to progress toward smoking cessation:  stress Comments: The patient is interested in quitting  Plan: Instruction/counseling given:  I counseled patient on the dangers of tobacco use, advised patient to stop smoking, and reviewed strategies to maximize success. Educational resources provided:  [employer]) brochure Self management tools provided:    Medications to assist with smoking cessation:  Nicotine Patch, tapering from 21 mg x6 weeks, followed by 14 mg x2 weeks, then 7 mg x2 weeks Patient agreed to the following self-care plans for smoking cessation:    Other plans: Reassess at next visit

## 2012-08-15 NOTE — Patient Instructions (Addendum)
General Instructions: For your seizure disorder, we will continue Dilantin.  We will continue you on the same dose, but have changed your prescription to the 200 mg tablets, so once you fill the new prescription you will only be taking 1 tablet twice per day.  Congratulations on deciding to quit smoking!  We have prescribed the nicotine patches.  We will slowly give you less and less nicotine through these patches: 1. Take the 21 mg patch, one patch per day, for 6 weeks.  We have sent in this prescription to your pharmacy 2. Starting around July 22nd, start taking the 14 mg patch, one patch per day for 2 weeks. 3. Starting around August 5th, start taking the 7 mg patch, one patch per day for 2 weeks, then stop.  Please return for a follow-up visit in 6 months.  Treatment Goals:  Goals (1 Years of Data) as of 08/15/12         02/21/12     Lifestyle    . Quit smoking / using tobacco  No      Progress Toward Treatment Goals:    Self Care Goals & Plans:  Self Care Goal 08/15/2012  Manage my medications take my medicines as prescribed; bring my medications to every visit; refill my medications on time  Eat healthy foods drink diet soda or water instead of juice or soda; eat more vegetables; eat foods that are low in salt; eat baked foods instead of fried foods       Care Management & Community Referrals:

## 2012-08-16 NOTE — Progress Notes (Signed)
Case discussed with Dr. Brown at the time of the visit.  We reviewed the resident's history and exam and pertinent patient test results.  I agree with the assessment, diagnosis and plan of care documented in the resident's note. 

## 2013-01-11 ENCOUNTER — Other Ambulatory Visit: Payer: Self-pay

## 2013-08-30 IMAGING — CR DG PORTABLE PELVIS
1 series · 1 of 1 positions shown · non-contrast
Comparison: 10/12/2005.

CLINICAL DATA: 51-year-old male status post right hip surgery.

PORTABLE PELVIS

[AP]
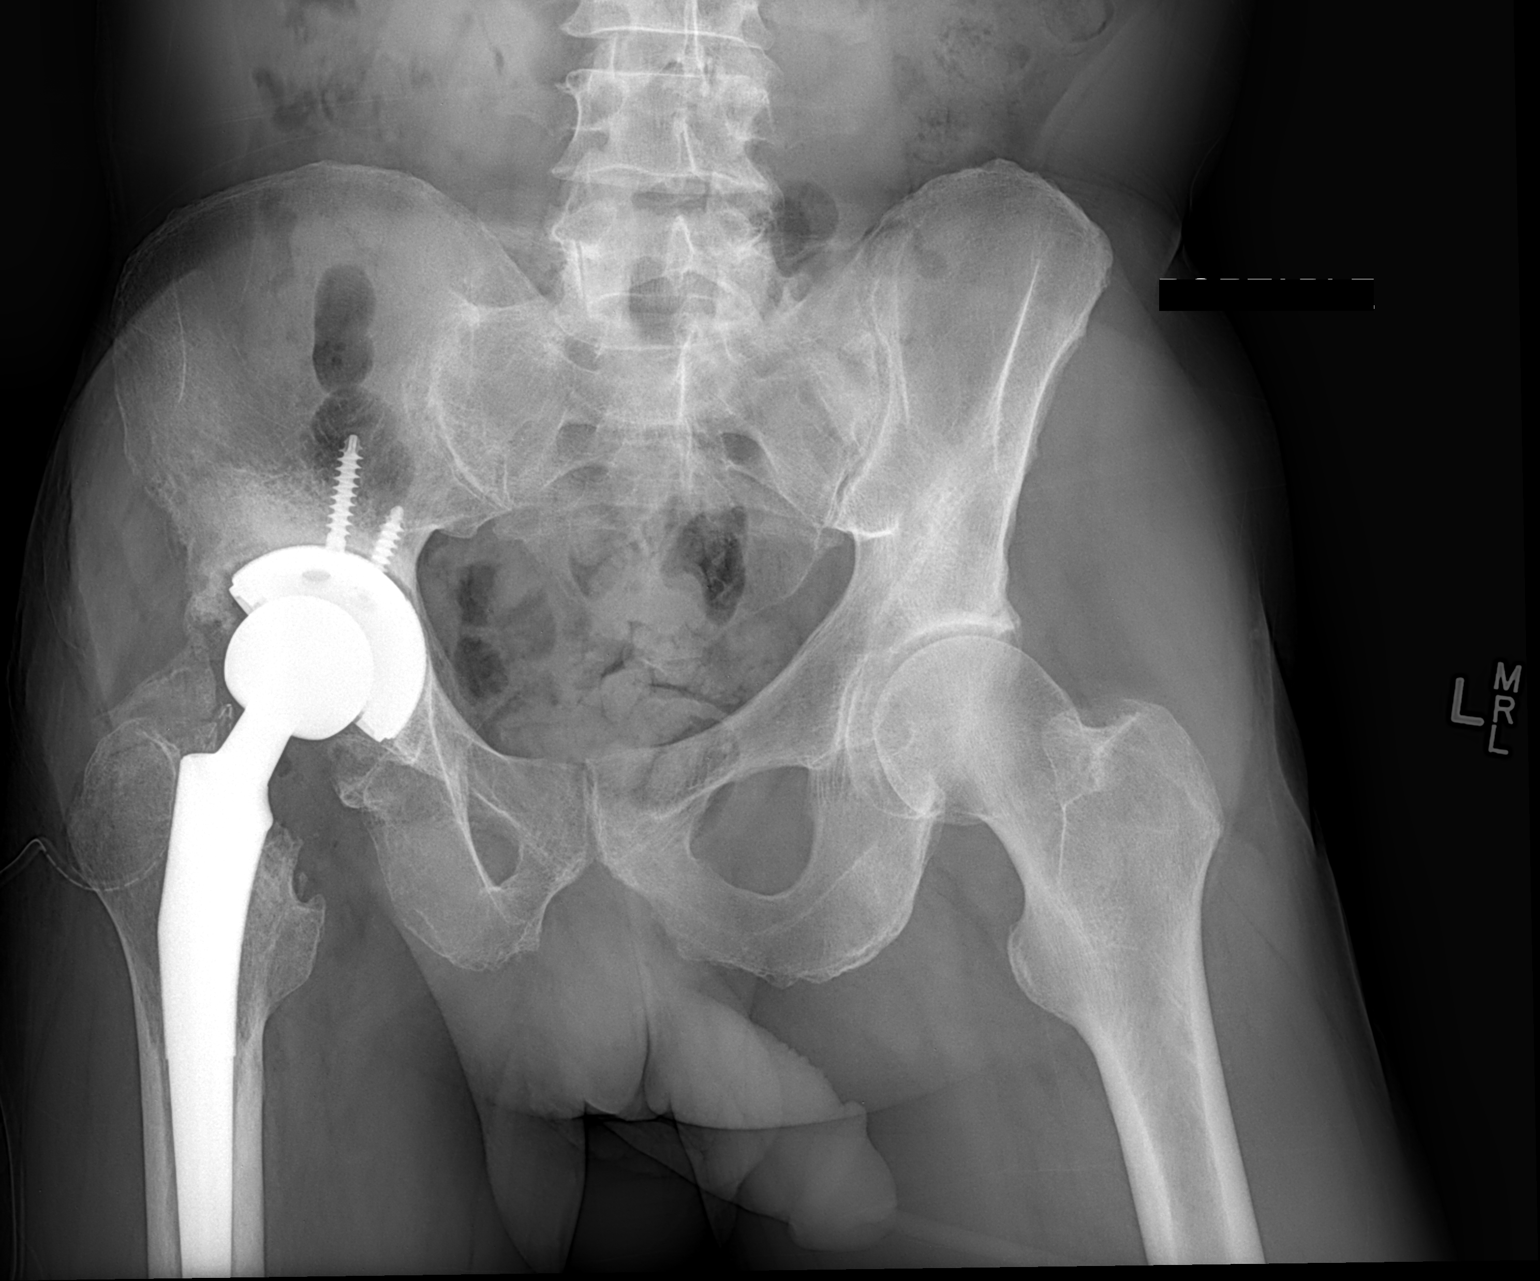

[1 of 1 positions shown; findings below may reference images not displayed]

FINDINGS: Portable AP view 6181 hours.  Interval revision of right
hip arthroplasty, now total arthroplasty.  Visualized hardware
appears intact normally aligned.  Postoperative drain in place.
Sclerosis around the right acetabulum again noted.  No unexpected
osseous changes identified.
IMPRESSION: Right hip arthroplasty revision with no adverse features
identified.

## 2013-08-30 IMAGING — CR DG HIP 1V PORT*R*
1 series · 1 of 1 positions shown · non-contrast
Comparison: 05/24/2011.

CLINICAL DATA: 51-year-old male postop right hip surgery.

PORTABLE RIGHT HIP - 1 VIEW

[swimmers]
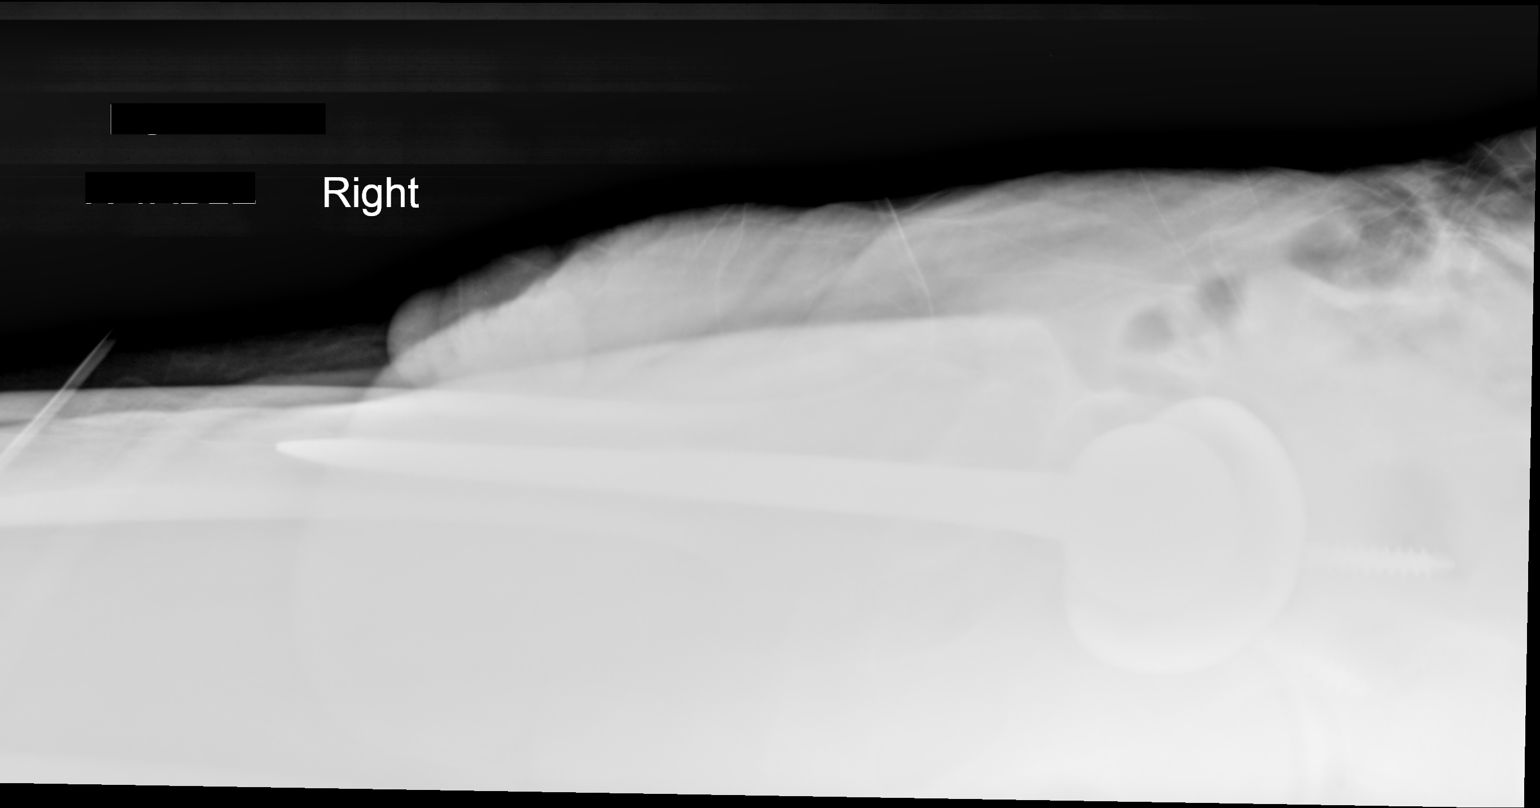

[1 of 1 positions shown; findings below may reference images not displayed]

FINDINGS: Portable cross-table lateral view the right hip.
Interval revision of right hip arthroplasty, now total
arthroplasty.  Hardware appears intact normally aligned.
IMPRESSION: Interval revision of right hip arthroplasty with no adverse
features.

## 2013-09-26 ENCOUNTER — Other Ambulatory Visit: Payer: Self-pay | Admitting: *Deleted

## 2013-09-26 DIAGNOSIS — R569 Unspecified convulsions: Secondary | ICD-10-CM

## 2013-09-26 NOTE — Telephone Encounter (Signed)
Last visit 08/2012

## 2013-10-01 MED ORDER — PHENYTOIN SODIUM EXTENDED 200 MG PO CAPS: 200.0000 mg | ORAL_CAPSULE | Freq: Two times a day (BID) | ORAL | Status: DC

## 2013-10-01 NOTE — Telephone Encounter (Signed)
I have discussed patient's plan of care with Dr. Posey Pronto and agree with the clinic note.

## 2013-10-01 NOTE — Telephone Encounter (Signed)
Spoke to pharmacy who noted that patient has been consistently inconsistent with filling his medication (picks up 30-day supply every 90 days) since August 2014.   Plan to refill today for one month and have patient come in for an appointment.

## 2013-10-02 ENCOUNTER — Encounter: Payer: Self-pay | Admitting: Internal Medicine

## 2013-10-02 ENCOUNTER — Ambulatory Visit (INDEPENDENT_AMBULATORY_CARE_PROVIDER_SITE_OTHER): Payer: PRIVATE HEALTH INSURANCE | Admitting: Internal Medicine

## 2013-10-02 VITALS — BP 97/63 | HR 62 | Temp 98.4°F | Ht 66.5 in | Wt 138.6 lb

## 2013-10-02 DIAGNOSIS — Z720 Tobacco use: Secondary | ICD-10-CM

## 2013-10-02 DIAGNOSIS — R930 Abnormal findings on diagnostic imaging of skull and head, not elsewhere classified: Secondary | ICD-10-CM

## 2013-10-02 DIAGNOSIS — F17200 Nicotine dependence, unspecified, uncomplicated: Secondary | ICD-10-CM

## 2013-10-02 DIAGNOSIS — R569 Unspecified convulsions: Secondary | ICD-10-CM

## 2013-10-02 LAB — COMPLETE METABOLIC PANEL WITH GFR
ALK PHOS: 110 U/L (ref 39–117)
ALT: 26 U/L (ref 0–53)
AST: 25 U/L (ref 0–37)
Albumin: 4 g/dL (ref 3.5–5.2)
BUN: 15 mg/dL (ref 6–23)
CO2: 30 mEq/L (ref 19–32)
CREATININE: 0.96 mg/dL (ref 0.50–1.35)
Calcium: 9.1 mg/dL (ref 8.4–10.5)
Chloride: 104 mEq/L (ref 96–112)
GFR, Est African American: >89 mL/min
GFR, Est Non African American: >89 mL/min
Glucose, Bld: 87 mg/dL (ref 70–99)
POTASSIUM: 4.8 meq/L (ref 3.5–5.3)
Sodium: 139 mEq/L (ref 135–145)
Total Bilirubin: 0.2 mg/dL (ref 0.2–1.2)
Total Protein: 7.1 g/dL (ref 6.0–8.3)

## 2013-10-02 LAB — CBC
HEMATOCRIT: 39 % (ref 39.0–52.0)
Hemoglobin: 13.9 g/dL (ref 13.0–17.0)
MCH: 29.3 pg (ref 26.0–34.0)
MCHC: 35.6 g/dL (ref 30.0–36.0)
MCV: 82.3 fL (ref 78.0–100.0)
Platelets: 235 10*3/uL (ref 150–400)
RBC: 4.74 MIL/uL (ref 4.22–5.81)
RDW: 14.4 % (ref 11.5–15.5)
WBC: 6.8 10*3/uL (ref 4.0–10.5)

## 2013-10-02 NOTE — Progress Notes (Signed)
   Subjective:    Patient ID: Bill [name], male    DOB: 12/30/1960, 53 y.o.   MRN: [id number]  HPI [name] is a 53 year old male with seizure disorder and current tobacco abuse who presents today to refill his medications.  He has not been seen in the clinic since June 2014 but asked for a refill of his Dilantin. I refilled a 30-day supply contingent on him coming in for a follow-up.  Seizure disorder: He has had seizures since age 30. He takes Dilantin 200mg  BID but has a history of non-adherence given that in prior visits his Dilantin level has been subtherapeutic (5.2 in December 2013).  His last seizure was in 2011. He feels jittery and funny in his body when one was about to come on, and he felt like this a few months ago. Triggers include blinking lights, flickering TV. He does note decreased energy and moodiness since his last visit and reports taking the medication four times a day. At his last visit, the medication strength was altered so that he could take less tablets (100mg  x 4->200mg  BID). He struggles with paying for his medication and is about to lose his job and thus coverage but is interested in other options.   Tobacco abuse: He still smokes the same amount from his last visit: 1/2 ppd. He has lost 4 lbs and attributes this to not eating well while working in the heat. He does have nicotine patches at home but hasn't gotten around to using him. Of note, his chest imaging dating back to 2008 shows abnormalities within his lungs, and he is interested in repeating a chest CT.  Review of Systems  Constitutional: Positive for fatigue.  Respiratory: Negative for cough and shortness of breath.   Cardiovascular: Negative for chest pain and palpitations.  Gastrointestinal: Negative for blood in stool.  Neurological: Negative for dizziness, seizures and headaches.  Psychiatric/Behavioral: Negative for suicidal ideas and self-injury.  All other systems reviewed and are  negative.      Objective:   Physical Exam Constitutional: He is oriented to person, place, and time. He appears well-developed and well-nourished. No distress.  HENT:  Head: Normocephalic and atraumatic.  Eyes: Conjunctivae are normal. Pupils are equal, round, and reactive to light.  Cardiovascular: Normal rate, regular rhythm and normal heart sounds.  Exam reveals no gallop and no friction rub.   No murmur heard. Pulmonary/Chest: Effort normal. No respiratory distress. He has no wheezes. He has no rales. Diminished breath sounds bilaterally. Abdominal: Soft. Bowel sounds are normal. He exhibits no distension. There is no tenderness.  Neurological: He is alert and oriented to person, place, and time. No cranial nerve deficit. Coordination normal.  Skin: Skin is warm and dry. He is not diaphoretic.  Psychiatric: His behavior is normal.          Assessment & Plan:

## 2013-10-02 NOTE — Patient Instructions (Addendum)
We will give you a call about your phenytoin level. Our social worker will give you a call about considering insurance options.   Please bring your medicines with you each time you come.   Medicines may be  Eye drops  Herbal   Vitamins  Pills  Seeing these help [name] take care of you.  Thank you!

## 2013-10-02 NOTE — Addendum Note (Signed)
Addended by: Riccardo Dubin on: 10/02/2013 05:05 PM   Modules accepted: Orders

## 2013-10-02 NOTE — Assessment & Plan Note (Signed)
Assessment -Though he has not had a seizure for some time, he does report "coming close" which warrants careful evaluation of his current treatment. -His fatigue and moodiness may be symptoms of taking more than he should be. -He appears to not have good insight into his medication regimen and will require clear and simple explanations.  Plan -Check phenytoin level today and call patient about results -Check CMET & CBC to monitor potential side effects -Give medication benefit card to help him purchase his medication

## 2013-10-02 NOTE — Assessment & Plan Note (Signed)
Assessment -He continues to smoke 1/2 ppd which puts him at a higher risk for malignancy. -Occupational exposure may also increase his risk. -Needs to establish coverage before pursuing imaging  Plan -Consult social work for help with applying for insurance coverage -Repeat imaging once he is able to finance his health care

## 2013-10-02 NOTE — Assessment & Plan Note (Signed)
  Assessment: Progress toward smoking cessation:  smoking the same amount Barriers to progress toward smoking cessation:  stress Comments:  -He is contemplative as he knows what he needs to do but hasn't mustered up the effort to do it. -History of quitting for 8 years is promising.  Plan: Instruction/counseling given:  I counseled patient on the dangers of tobacco use, advised patient to stop smoking, and reviewed strategies to maximize success. Educational resources provided:    Self management tools provided:    Medications to assist with smoking cessation:  Nicotine Patch Patient agreed to the following self-care plans for smoking cessation:    Other plans:  -Continue reassessing at each visit

## 2013-10-03 LAB — PHENYTOIN LEVEL, TOTAL: Phenytoin Lvl: 4.1 ug/mL — ABNORMAL LOW (ref 10.0–20.0)

## 2013-10-03 NOTE — Progress Notes (Signed)
INTERNAL MEDICINE TEACHING ATTENDING ADDENDUM - Aldine Contes, MD: I personally saw and evaluated Mr. Staley in this clinic visit in conjunction with the resident, Dr. Posey Pronto. I have discussed patient's plan of care with medical resident during this visit. I have confirmed the physical exam findings and have read and agree with the clinic note including the plan with the following addition: - Patient here for refill of dilantin - On exam: Cardio- RRR, normal heart sounds, Lungs- CTA b/l - Check dilantin level as patient appears to be taking double the dose recommended by his neurologist - Will need CT chest to evaluate lung nodules once insurance issues sorted out - Patient advised to stop smoking

## 2013-10-05 MED ORDER — PHENYTOIN SODIUM EXTENDED 200 MG PO CAPS: 200.0000 mg | ORAL_CAPSULE | Freq: Two times a day (BID) | ORAL | Status: DC

## 2013-10-05 NOTE — Addendum Note (Signed)
Addended by: Riccardo Dubin on: 10/05/2013 05:14 PM   Modules accepted: Orders

## 2013-11-09 ENCOUNTER — Encounter: Payer: Self-pay | Admitting: Internal Medicine

## 2013-11-09 ENCOUNTER — Ambulatory Visit (INDEPENDENT_AMBULATORY_CARE_PROVIDER_SITE_OTHER): Payer: PRIVATE HEALTH INSURANCE | Admitting: Internal Medicine

## 2013-11-09 VITALS — BP 110/63 | HR 67 | Temp 97.6°F | Ht 66.5 in | Wt 142.1 lb

## 2013-11-09 DIAGNOSIS — R918 Other nonspecific abnormal finding of lung field: Secondary | ICD-10-CM

## 2013-11-09 DIAGNOSIS — Z720 Tobacco use: Secondary | ICD-10-CM

## 2013-11-09 DIAGNOSIS — R569 Unspecified convulsions: Secondary | ICD-10-CM

## 2013-11-09 DIAGNOSIS — F17200 Nicotine dependence, unspecified, uncomplicated: Secondary | ICD-10-CM

## 2013-11-09 LAB — PHENYTOIN LEVEL, TOTAL: Phenytoin Lvl: 5.5 ug/mL — ABNORMAL LOW (ref 10.0–20.0)

## 2013-11-09 NOTE — Assessment & Plan Note (Addendum)
Assessment -Long overdue for follow-up of lung nodules per [facility] Society guidelines as he is high risk given active tobacco use -Cost may be an obstacle for him and likely won't be resolved until November  Plan -Schedule lung CT

## 2013-11-09 NOTE — Assessment & Plan Note (Addendum)
He was started back on Dilantin 200mg  BID and reports adherence to treatment without any adverse effects to the medication.  He hasn't had any seizures since last time. He is still trying to change over to another insurance and won't be able to get anything done until November. He feels his current video games are not a trigger though notes one video game in particular always seemed to induce seizures; he cannot recall its name. His phenytoin level today is 5.5.  Assessment -Subtherapeutic: he will need to maintain this dose to achieve 12-15 mcg/mL goal.  Plan -Continue current dose with plan to recheck in 4-6 weeks -Continue assessing for mood symptoms as well  ADDENDUM 11/09/2013 7:08 PM:  Per pharmacy, he is still taking old prescription (100mg  QID) except just BID. This accounts for why he is still sub-therapeutic. I called him to clarify, and he agreed to go back to QID until he ran out of medication. He still has a refill of his 200mg  BID at the pharmacy which should cover him. Now that he is actually taking his dose, I will need to speak with Dr. Maudie Mercury to consider the next trough.

## 2013-11-09 NOTE — Assessment & Plan Note (Signed)
  Assessment: Progress toward smoking cessation:    Barriers to progress toward smoking cessation:  other tobacco users at home;other tobacco users at [employer] of motivation to quit Comments: He is still pre-contemplative about quitting though is aware that he is not doing his body well by continuing to smoke.   Plan: Instruction/counseling given:  I counseled patient on the dangers of tobacco use, advised patient to stop smoking, and reviewed strategies to maximize success. Educational resources provided:    Self management tools provided:    Medications to assist with smoking cessation:  None Patient agreed to the following self-care plans for smoking cessation:    Other plans: Continue to follow-up at each visit

## 2013-11-09 NOTE — Patient Instructions (Signed)
Please follow-up in October to get your flu shot.   General Instructions:   Please bring your medicines with you each time you come to clinic.  Medicines may include prescription medications, over-the-counter medications, herbal remedies, eye drops, vitamins, or other pills.   Progress Toward Treatment Goals:  Treatment Goal 10/02/2013  Stop smoking smoking the same amount    Self Care Goals & Plans:  Self Care Goal 08/15/2012  Manage my medications take my medicines as prescribed; bring my medications to every visit; refill my medications on time  Eat healthy foods drink diet soda or water instead of juice or soda; eat more vegetables; eat foods that are low in salt; eat baked foods instead of fried foods    No flowsheet data found.   Care Management & Community Referrals:  Referral 10/02/2013  Referrals made for care management support social worker

## 2013-11-09 NOTE — Progress Notes (Signed)
   Subjective:    Patient ID: Bill [name], male    DOB: [birth year], 53 y.o.   MRN: [id number]  HPI Bill [name] is a 53 year old male with seizure disorder and current tobacco abuse who presents today to re-check Dilantin level.  Please see assessment & plan for documentation of her problems.  Review of Systems  Constitutional: Negative for fever and appetite change.  Respiratory: Negative for shortness of breath.   Cardiovascular: Negative for chest pain.  Gastrointestinal: Negative for abdominal pain.  Neurological: Negative for seizures.       Objective:   Physical Exam  Constitutional: He is oriented to person, place, and time. He appears well-developed and well-nourished. No distress.  HENT:  Head: Normocephalic and atraumatic.  Eyes: Conjunctivae are normal. Pupils are equal, round, and reactive to light.  Cardiovascular: Normal rate, regular rhythm and normal heart sounds. Exam reveals no gallop and no friction rub.  No murmur heard.  Pulmonary/Chest: Effort normal. No respiratory distress. He has no wheezes. He has no rales. Diminished breath sounds bilaterally.  Abdominal: Soft. Bowel sounds are normal. He exhibits no distension. There is no tenderness.  Neurological: He is alert and oriented to person, place, and time. No cranial nerve deficit. Coordination normal.  Skin: Skin is warm and dry. He is not diaphoretic.  Psychiatric: His behavior is normal.          Assessment & Plan:

## 2013-11-10 NOTE — Progress Notes (Signed)
I saw and evaluated the patient.  I personally confirmed the key portions of Dr. Patel's history and exam and reviewed pertinent patient test results.  The assessment, diagnosis, and plan were formulated together and I agree with the documentation in the resident's note. 

## 2013-11-15 ENCOUNTER — Other Ambulatory Visit: Payer: Self-pay | Admitting: Internal Medicine

## 2013-11-15 DIAGNOSIS — R569 Unspecified convulsions: Secondary | ICD-10-CM

## 2013-11-19 ENCOUNTER — Other Ambulatory Visit (INDEPENDENT_AMBULATORY_CARE_PROVIDER_SITE_OTHER): Payer: PRIVATE HEALTH INSURANCE

## 2013-11-19 DIAGNOSIS — Z7901 Long term (current) use of anticoagulants: Secondary | ICD-10-CM

## 2013-11-19 DIAGNOSIS — Z79899 Other long term (current) drug therapy: Secondary | ICD-10-CM

## 2013-11-19 NOTE — Progress Notes (Signed)
Sounds good, will follow along.

## 2013-11-20 LAB — PHENYTOIN LEVEL, TOTAL: Phenytoin Lvl: 7.3 ug/mL — ABNORMAL LOW (ref 10.0–20.0)

## 2013-11-22 LAB — PHENYTOIN LEVEL, FREE AND TOTAL
PHENYTOIN, TOTAL: 6 mg/L — AB (ref 10.0–20.0)
Phenytoin Bound: 5.1 mg/L
Phenytoin, Free: 0.9 mg/L — ABNORMAL LOW (ref 1.0–2.0)

## 2013-12-03 ENCOUNTER — Other Ambulatory Visit: Payer: Self-pay | Admitting: Internal Medicine

## 2013-12-03 MED ORDER — PHENYTOIN 50 MG PO CHEW: 50.0000 mg | CHEWABLE_TABLET | Freq: Every day | ORAL | Status: DC

## 2013-12-03 NOTE — Addendum Note (Signed)
Addended by: Riccardo Dubin on: 12/03/2013 01:30 PM   Modules accepted: Orders

## 2013-12-05 ENCOUNTER — Other Ambulatory Visit: Payer: Self-pay | Admitting: Internal Medicine

## 2013-12-05 ENCOUNTER — Encounter: Payer: Self-pay | Admitting: Pharmacist

## 2013-12-05 NOTE — Telephone Encounter (Signed)
As this patient was seen by me on 11/09/13 and deemed necessary for their treatment as documented in the EHR, I will refill this prescription for Dilantin 200mg  BID.

## 2013-12-06 ENCOUNTER — Other Ambulatory Visit: Payer: Self-pay | Admitting: Internal Medicine

## 2013-12-06 DIAGNOSIS — R569 Unspecified convulsions: Secondary | ICD-10-CM

## 2013-12-06 NOTE — Addendum Note (Signed)
Addended by: Forde Dandy on: 12/06/2013 01:46 PM   Modules accepted: Orders

## 2013-12-10 NOTE — Progress Notes (Signed)
The trough would be fine since renal function and albumin are okay. Will keep an eye out for it.

## 2013-12-21 ENCOUNTER — Ambulatory Visit: Payer: Self-pay | Admitting: Internal Medicine

## 2013-12-28 ENCOUNTER — Ambulatory Visit (INDEPENDENT_AMBULATORY_CARE_PROVIDER_SITE_OTHER): Payer: PRIVATE HEALTH INSURANCE | Admitting: Internal Medicine

## 2013-12-28 ENCOUNTER — Encounter: Payer: Self-pay | Admitting: Internal Medicine

## 2013-12-28 VITALS — BP 114/62 | HR 56 | Temp 97.6°F | Wt 141.3 lb

## 2013-12-28 DIAGNOSIS — R569 Unspecified convulsions: Secondary | ICD-10-CM

## 2013-12-28 DIAGNOSIS — Z23 Encounter for immunization: Secondary | ICD-10-CM

## 2013-12-28 NOTE — Progress Notes (Signed)
Medicine attending: Medical history, presenting problems, physical findings, and medications, reviewed with resident physician Dr. Jessee Avers and I concur with his evaluation and management plan. Murriel Hopper, M.D., [place]

## 2013-12-28 NOTE — Progress Notes (Signed)
Patient ID: NOAL ABSHIER, male   DOB: [birth year], 53 y.o.   MRN: [id number]   Subjective:   HPI: Mr.Paxton S Lara is a 53 y.o. gentleman with past medical history of seizures, history of tobacco abuse, and medication noncompliance, who presents to the clinic for followup visit for his Dilantin level.  His last office visit was on 11/09/2013. His Dilantin level was subtherapeutic. At that time his phenytoin dose was increased to 200 mg twice a day with an additional 50 mg daily. He reports that his been compliant with this regimen. He has continued to take 200 mg daily, following a phone call from his doctor after the office visit. He has not taken the 50 mg for over a week. He has been compliant with her 200 mg and he has only missed one dose since his last visit. He missed his dose yesterday in the morning. He has no recurrence of seizures. He is in good health and his only request today is a flu shot.  ROS: Constitutional: Denies fever, chills, diaphoresis, appetite change and fatigue.  Respiratory: Denies SOB, DOE, cough, chest tightness, and wheezing. Denies chest pain. CVS: No chest pain, palpitations and leg swelling.  GI: No abdominal pain, nausea, vomiting, bloody stools GU: No dysuria, frequency, hematuria, or flank pain.  MSK: No myalgias, back pain, joint swelling, arthralgias  Psych: No depression symptoms. No SI or SA.    Objective:  Physical Exam: Filed Vitals:   12/28/13 0934  BP: 114/62  Pulse: 56  Temp: 97.6 F (36.4 C)  TempSrc: Oral  Weight: 141 lb 4.8 oz (64.093 kg)  SpO2: 100%   General: Well nourished. No acute distress.  HEENT: Normal oral mucosa. MMM.  Lungs: CTA bilaterally. Heart: RRR; no extra sounds or murmurs  Abdomen: Non-distended, normal bowel sounds, soft, nontender; no hepatosplenomegaly  Extremities: No pedal edema. No joint swelling or tenderness. Neurologic: Normal EOM,  Alert and oriented x3. No obvious neurologic/cranial nerve  deficits.  Assessment & Plan:  Discussed case with my attending in the clinic, Dr. Beryle Beams. See problem based charting.

## 2013-12-28 NOTE — Patient Instructions (Addendum)
General Instructions: We will check labs today  I will call you with results from your blood check Please take your medications as prescribed  Thank you for bringing your medicines today. This helps [name] keep you safe from mistakes.   Progress Toward Treatment Goals:  Treatment Goal 10/02/2013  Stop smoking smoking the same amount    Self Care Goals & Plans:  Self Care Goal 08/15/2012  Manage my medications take my medicines as prescribed; bring my medications to every visit; refill my medications on time  Eat healthy foods drink diet soda or water instead of juice or soda; eat more vegetables; eat foods that are low in salt; eat baked foods instead of fried foods    No flowsheet data found.   Care Management & Community Referrals:  Referral 10/02/2013  Referrals made for care management support social worker

## 2013-12-28 NOTE — Assessment & Plan Note (Signed)
Compliant with Dilantin 200 milligrams twice daily. No seizure recurrence. Plan -Discussed with the pharmacist Dr. Maudie Mercury, who was familiar with the patient's antiepileptic treatment -Will check Dilantin level today -Reemphasized compliance. -Followup in one month. -Given a flu shot

## 2013-12-29 LAB — PHENYTOIN LEVEL, TOTAL: PHENYTOIN LVL: 7.8 ug/mL — AB (ref 10.0–20.0)

## 2014-01-02 NOTE — Addendum Note (Signed)
Addended by: Orson Gear on: 01/02/2014 02:51 PM   Modules accepted: Orders

## 2014-01-02 NOTE — Addendum Note (Signed)
Addended by: Orson Gear on: 01/02/2014 02:49 PM   Modules accepted: Orders

## 2014-01-28 ENCOUNTER — Ambulatory Visit: Payer: Self-pay | Admitting: Internal Medicine

## 2014-01-30 ENCOUNTER — Encounter: Payer: Self-pay | Admitting: Internal Medicine

## 2014-02-12 ENCOUNTER — Encounter: Payer: Self-pay | Admitting: Internal Medicine

## 2014-02-12 ENCOUNTER — Ambulatory Visit (INDEPENDENT_AMBULATORY_CARE_PROVIDER_SITE_OTHER): Payer: PRIVATE HEALTH INSURANCE | Admitting: Internal Medicine

## 2014-02-12 VITALS — BP 111/66 | HR 69 | Temp 98.2°F | Ht 65.0 in | Wt 143.3 lb

## 2014-02-12 DIAGNOSIS — R569 Unspecified convulsions: Secondary | ICD-10-CM

## 2014-02-12 DIAGNOSIS — Z720 Tobacco use: Secondary | ICD-10-CM

## 2014-02-12 MED ORDER — PHENYTOIN SODIUM EXTENDED 100 MG PO CAPS: 200.0000 mg | ORAL_CAPSULE | Freq: Two times a day (BID) | ORAL | Status: DC

## 2014-02-12 NOTE — Assessment & Plan Note (Signed)
Patient states his last seizure was 2011. Recently had changes in his medication dosing and issues w/ compliance, now taking Dilantin 200 mg bid. Says he has not missed any doses recently. Patient was last seen in the clinic on 12/28/13 at which time his dilantin level was 7.8, slightly subtherapeutic.  -Will recheck dilantin level again today. Results pending.  -Gave refill of Dilantin x1; will call patient to inform if new dosage is necessary.

## 2014-02-12 NOTE — Assessment & Plan Note (Signed)
Patient states he is still smoking the same amount and has not started using the patches. He says he wants to quit but has not quite found the motivation to do so. Says he is planning on quitting on 03/08/14. Encouraged this method of picking a quite date. Also encouraged finding a friend/family member to quit as well to have someone to go through cravings with. He felt like this may be a good idea.  -Patient to return to clinic in 3-6 months. Can readdress smoking cessation at that time.

## 2014-02-12 NOTE — Progress Notes (Signed)
Subjective:   Patient ID: Bill [name] male   DOB: 19-Jul-1960 53 y.o.   MRN: [id number]  HPI: [name] is a 53 y.o. male w/ PMHx of Seizure Disorder on Dilantin, osteoarthritis, and history of tobacco abuse, presents to the clinic today for a follow-up visit. The patient was last seen on 12/28/13 for follow up regarding Dilantin compliance at which time he was taking 200 mg bid. Phenytoin level was checked during that visit, shown to be somewhat sub-therapeutic at 7.8. Today, the patient says he is doing quite well, no significant complaints. [name] says his last seizure was in 2011. He denies recent dizziness, lightheadedness, confusion, incontinence, tongue biting, LOC, chest pain, SOB, or vision changes.  The patient does state he is still smoking and doesn't have the motivation to quit yet. He does state he intends on quitting on 03/08/14.  Past Medical History  Diagnosis Date  . Seizure disorder     Started at age 77. Last EMR record 05/25/09  . History of tobacco abuse   . Chronic chest pain     Normal stress test in 2003  . Pleural plaque     MULTIPLE BILATERAL PLEURAL PLAQUES, which had not changed on CT as of 1/04  . Avascular necrosis of bone of hip 2007    Right  . Alcohol abuse     Hx of , stopped in 2000. Had 12 pack for 25 years.   . Seizures     PT THINKS GRAND MAL  - LAST TIME PT THINKS WAS 2011  . Arthritis     S/P RIGHT TOTAL HIP ARTHROPLASTY 2007 --PT HAS NOTICED PAIN RIGHT LEG--IS TOLD HE NEEDS RT HIP REVISION   Current Outpatient Prescriptions  Medication Sig Dispense Refill  . DILANTIN 100 MG ER capsule TAKE 2 CAPSULES BY MOUTH TWICE DAILY 120 capsule 0  . nicotine (NICODERM CQ - DOSED IN MG/24 HOURS) 14 mg/24hr patch Place 1 patch onto the skin daily. 14 patch 0  . nicotine (NICODERM CQ - DOSED IN MG/24 HOURS) 21 mg/24hr patch Place 1 patch onto the skin daily. 28 patch 2  . nicotine (NICODERM CQ - DOSED IN MG/24 HR) 7 mg/24hr patch Place 1  patch onto the skin daily. 14 patch 0   No current facility-administered medications for this visit.    Review of Systems: General: Denies fever, chills, diaphoresis, appetite change and fatigue.  Respiratory: Denies SOB, DOE, cough, and wheezing.   Cardiovascular: Denies chest pain and palpitations.  Gastrointestinal: Denies nausea, vomiting, abdominal pain, and diarrhea.  Genitourinary: Denies dysuria, increased frequency, and flank pain. Endocrine: Denies hot or cold intolerance, polyuria, and polydipsia. Musculoskeletal: Denies myalgias, back pain, joint swelling, arthralgias and gait problem.  Skin: Denies pallor, rash and wounds.  Neurological: Denies dizziness, seizures, syncope, weakness, lightheadedness, numbness and headaches.  Psychiatric/Behavioral: Denies mood changes, and sleep disturbances.  Objective:   Physical Exam: Filed Vitals:   02/12/14 1434  BP: 111/66  Pulse: 69  Temp: 98.2 F (36.8 C)  TempSrc: Oral  Height: 5\' 5"  (1.651 m)  Weight: 143 lb 4.8 oz (65 kg)  SpO2: 99%    General: Thin AA male, alert, cooperative, NAD. HEENT: PERRL, EOMI. Moist mucus membranes Neck: Full range of motion without pain, supple, no lymphadenopathy or carotid bruits Lungs: Clear to ascultation bilaterally, normal work of respiration, no wheezes, rales, rhonchi Heart: RRR, no murmurs, gallops, or rubs Abdomen: Soft, non-tender, non-distended, BS + Extremities: No cyanosis, clubbing, or  edema Neurologic: Alert & oriented X3, cranial nerves II-XII intact, strength grossly intact, sensation intact to light touch   Assessment & Plan:   Please see problem based assessment and plan.

## 2014-02-12 NOTE — Patient Instructions (Addendum)
General Instructions:  1. Please schedule a follow up appointment for 2-3 months.  2. Please take all medications as prescribed.   Continue Dilantin 200 mg twice daily for now.   If the dosage needs to be adjusted, I will call you regarding this.   3. If you have worsening of your symptoms or new symptoms arise, please call the clinic ([phone]), or go to the ER immediately if symptoms are severe.  You have done a great job in taking all your medications. I appreciate it very much. Please continue doing that.   Please bring your medicines with you each time you come to clinic.  Medicines may include prescription medications, over-the-counter medications, herbal remedies, eye drops, vitamins, or other pills.   Progress Toward Treatment Goals:  Treatment Goal 02/12/2014  Stop smoking smoking the same amount    Self Care Goals & Plans:  Self Care Goal 02/12/2014  Manage my medications take my medicines as prescribed; bring my medications to every visit; refill my medications on time  Eat healthy foods drink diet soda or water instead of juice or soda; eat more vegetables; eat foods that are low in salt; eat baked foods instead of fried foods; eat fruit for snacks and desserts  Stop smoking -  Meeting treatment goals maintain the current self-care plan    No flowsheet data found.   Care Management & Community Referrals:  Referral 02/12/2014  Referrals made for care management support none needed  Referrals made to community resources none

## 2014-02-13 LAB — PHENYTOIN LEVEL, TOTAL: PHENYTOIN LVL: 9.4 ug/mL — AB (ref 10.0–20.0)

## 2014-02-13 NOTE — Progress Notes (Signed)
Internal Medicine Clinic Attending  Case discussed with Dr. Jones soon after the resident saw the patient.  We reviewed the resident's history and exam and pertinent patient test results.  I agree with the assessment, diagnosis, and plan of care documented in the resident's note. 

## 2014-04-29 ENCOUNTER — Encounter: Payer: Self-pay | Admitting: Internal Medicine

## 2014-04-29 ENCOUNTER — Ambulatory Visit (INDEPENDENT_AMBULATORY_CARE_PROVIDER_SITE_OTHER): Payer: 59 | Admitting: Internal Medicine

## 2014-04-29 VITALS — BP 120/59 | HR 63 | Temp 98.2°F | Ht 65.0 in | Wt 142.1 lb

## 2014-04-29 DIAGNOSIS — Z720 Tobacco use: Secondary | ICD-10-CM

## 2014-04-29 DIAGNOSIS — H918X2 Other specified hearing loss, left ear: Secondary | ICD-10-CM

## 2014-04-29 DIAGNOSIS — H6122 Impacted cerumen, left ear: Secondary | ICD-10-CM

## 2014-04-29 DIAGNOSIS — R569 Unspecified convulsions: Secondary | ICD-10-CM

## 2014-04-29 DIAGNOSIS — F17210 Nicotine dependence, cigarettes, uncomplicated: Secondary | ICD-10-CM

## 2014-04-29 DIAGNOSIS — R918 Other nonspecific abnormal finding of lung field: Secondary | ICD-10-CM

## 2014-04-29 NOTE — Addendum Note (Signed)
Addended by: Riccardo Dubin on: 04/29/2014 02:10 PM   Modules accepted: Miquel Dunn

## 2014-04-29 NOTE — Addendum Note (Signed)
Addended by: Riccardo Dubin on: 04/29/2014 02:45 PM   Modules accepted: Orders, SmartSet

## 2014-04-29 NOTE — Assessment & Plan Note (Signed)
  Assessment: Progress toward smoking cessation:  smoking the same amount Barriers to progress toward smoking cessation:  none Comments: reports he  is smoking roughly 1 pack every 3 days, which is about the same as when the last time I spoke to him about his smoking.  -He feels his triggers are namely coffee and that he needs to smoke a cigarette after he drinks several cups of coffee in the morning.  -Of interest, when he did quit smoking, coffee did not have the same effect on him and his cravings.  -He knows he needs to quit but is not sure with what or how.  Plan: Instruction/counseling given:  I counseled patient on the dangers of tobacco use, advised patient to stop smoking, and reviewed strategies to maximize success. Educational resources provided:  [employer] account manager) brochure Self management tools provided:    Medications to assist with smoking cessation:  None Patient agreed to the following self-care plans for smoking cessation: go to the [employer] (https://scott-booker.info/)  Other plans: advised him to call the quit line to review strategies that might work for him -Also explained that given that he has pulmonary nodules, it is in the interest of his lungs to which he acknowledges understanding

## 2014-04-29 NOTE — Progress Notes (Signed)
Internal Medicine Clinic Attending  Case discussed with Dr. Patel at the time of the visit.  We reviewed the resident's history and exam and pertinent patient test results.  I agree with the assessment, diagnosis, and plan of care documented in the resident's note.  

## 2014-04-29 NOTE — Assessment & Plan Note (Addendum)
-  reordered lung CT today as he has not followed up since last visit and he is at high risk given that he continues to smoke

## 2014-04-29 NOTE — Progress Notes (Signed)
   Subjective:    Patient ID: Bill [name], male    DOB: 17-Jan-1961, 54 y.o.   MRN: [id number]  HPI Mr. Amsden is a 54 year old male with seizure disorder and current tobacco abuse complicated by multiple pulmonary nodules who presents today for follow-up visit. Please see assessment & plan for documentation of each problem.   Review of Systems  HENT: Negative for ear discharge, ear pain and tinnitus.   Respiratory: Negative for shortness of breath.   Cardiovascular: Negative for chest pain and leg swelling.  Gastrointestinal: Negative for nausea, vomiting, abdominal pain, diarrhea and blood in stool.  Genitourinary: Negative for dysuria.  Neurological: Negative for dizziness.       Objective:   Physical Exam Constitutional: He is oriented to person, place, and time. He appears well-developed and well-nourished. Thin appearing, African-American male. No distress.  HENT:  Head: Normocephalic and atraumatic.  Ears: Right tympanic membrane visualized and appears without erythema or exudate. Left tympanic membrane could not be visualized due to obstruction by cerumen. Eyes: Conjunctivae are normal.  Cardiovascular: Normal rate, regular rhythm and normal heart sounds.  Exam reveals no gallop and no friction rub.   No murmur heard. Pulmonary/Chest: Effort normal. No respiratory distress. He has no wheezes with poor airflow bilaterally. He has no rales.  Neurological: He is alert and oriented to person, place, and time. Coordination normal.  Skin: Skin is warm and dry. He is not diaphoretic.  Psychiatric: His behavior is normal.          Assessment & Plan:

## 2014-04-29 NOTE — Assessment & Plan Note (Signed)
Last Dilantin level was 9.4 back on 02/12/14. He is continue to take Dilantin 200 mg twice a day. Plan is to recheck today and call him back should he need to increase his dose though I explained to him that it is likely he will be therapeutic. He denies any seizure-like symptoms since his last office visit.

## 2014-04-29 NOTE — Patient Instructions (Signed)
  Thank you for bringing your medicines today. This helps [name] keep you safe from mistakes.  You feel like her ears are getting worse, please come back for follow-up visit.  We are rescheduling your lung scan to make sure that the nodules that were seen on past scans are not changing. In the meantime, please continue working on trying to quit smoking.  Please call back in July for follow-up appointment.

## 2014-04-29 NOTE — Assessment & Plan Note (Signed)
Overview -He reports for the last month that he has had decreased hearing out of his left ear and a "clogged" feeling.  -About 3 weeks ago, he saw some Q-tips lying around at home and decided to try and clean his ears which did not alleviate his problems.  -He denies any fever, drainage, dizziness, changes to his medications, history of prior ear infections, or prior episodes of similar symptoms.  Assessment -Physical exam findings consistent with hearing loss likely secondary to cerumen impaction especially in the setting of having used a foreign object which and in his case was a Q-tip.  -Lack of vestibular symptoms, like dizziness or vertigo, makes it less likely that there is a central lesion though symptoms are unilateral.  -Lack of drainage, fever, ear pain also makes otitis media less likely.  Plan -Order cerumen disimpaction and reassess  ADDENDUM 04/29/2014  1:54 PM:  -Right tympanic membrane can be visualized but appears markedly red, unsure if this is secondary to his disimpaction though he is without any pain or other symptoms. -Advised him to return to clinic should he have worsening symptoms

## 2014-04-30 LAB — PHENYTOIN LEVEL, TOTAL: Phenytoin Lvl: <2.5 ug/mL — ABNORMAL LOW (ref 10.0–20.0)

## 2014-04-30 LAB — ALBUMIN: Albumin: 4 g/dL (ref 3.5–5.2)

## 2014-05-01 ENCOUNTER — Telehealth: Payer: Self-pay | Admitting: Pharmacist

## 2014-05-01 DIAGNOSIS — R569 Unspecified convulsions: Secondary | ICD-10-CM

## 2014-05-01 MED ORDER — PHENYTOIN SODIUM EXTENDED 100 MG PO CAPS: 200.0000 mg | ORAL_CAPSULE | Freq: Two times a day (BID) | ORAL | Status: DC

## 2014-05-01 NOTE — Telephone Encounter (Signed)
CLINICAL PHARMACY ADHERENCE SUPPORT Bill [name] is a 54 y.o. male who was reviewed by clinical pharmacy for medication adherence.   A/P:   Patient reports fair adherence, states he missed at least 4 doses the week prior to the lab test.  Although patient stated he has been on-schedule with refills and did not need further refills, per [facility] [phone]: last fill of phenytoin was 30 day supply on 03/23/14 (no refills remaining), so it is possible that patient ran out of the medication just prior to lab test.  Barriers to adherence include: trouble remembering.  Reinforced education on importance of medication adherence  Advised patient to use adherence tools (alarms, calendars, pill boxes)  Refilled phenytoin for patient.  Cost concerns were alleviated during previous encounter (patient was previously getting brand name so switched to generic)  Patient agrees to work towards improved adherence  Patient advised to contact me if any further concerns arise.  Thank you for including me in Bill [name] care. I am happy to continue to follow along/help as needed. Please let me know if I can be of further assistance.

## 2014-05-03 LAB — PHENYTOIN LEVEL, FREE AND TOTAL
Phenytoin, Free: 0.6 mg/L — ABNORMAL LOW (ref 1.0–2.0)
Phenytoin, Total: <2.5 mg/L — ABNORMAL LOW (ref 10.0–20.0)

## 2014-06-18 ENCOUNTER — Encounter: Payer: Self-pay | Admitting: Gastroenterology

## 2014-08-15 ENCOUNTER — Encounter: Payer: Self-pay | Admitting: Gastroenterology

## 2014-08-23 ENCOUNTER — Ambulatory Visit (HOSPITAL_COMMUNITY): Payer: 59 | Attending: Internal Medicine

## 2014-08-28 ENCOUNTER — Encounter: Payer: Self-pay | Admitting: Internal Medicine

## 2014-08-28 ENCOUNTER — Ambulatory Visit (INDEPENDENT_AMBULATORY_CARE_PROVIDER_SITE_OTHER): Payer: 59 | Admitting: Internal Medicine

## 2014-08-28 VITALS — BP 124/72 | HR 52 | Temp 98.4°F | Ht 66.0 in | Wt 135.0 lb

## 2014-08-28 DIAGNOSIS — G44209 Tension-type headache, unspecified, not intractable: Secondary | ICD-10-CM

## 2014-08-28 DIAGNOSIS — R569 Unspecified convulsions: Secondary | ICD-10-CM

## 2014-08-28 DIAGNOSIS — F17210 Nicotine dependence, cigarettes, uncomplicated: Secondary | ICD-10-CM

## 2014-08-28 DIAGNOSIS — D126 Benign neoplasm of colon, unspecified: Secondary | ICD-10-CM | POA: Insufficient documentation

## 2014-08-28 DIAGNOSIS — G40909 Epilepsy, unspecified, not intractable, without status epilepticus: Secondary | ICD-10-CM

## 2014-08-28 MED ORDER — CYCLOBENZAPRINE HCL 10 MG PO TABS: 10.0000 mg | ORAL_TABLET | Freq: Three times a day (TID) | ORAL | Status: DC | PRN

## 2014-08-28 NOTE — Assessment & Plan Note (Addendum)
Colonoscopy April of 2013, Dr Ardis Hughs recommended repeat in 3 years. - He has an appointment for follow up with Dr. Ardis Hughs and repeat colonoscopy.

## 2014-08-28 NOTE — Assessment & Plan Note (Addendum)
-   Reports compliance with phenytoin. - Will check phenytoin level  ADDENDUM: Phenytoin level undect. Called patient and he admits missing "a few doses,"  I stressed compliance and he will return in 2 weeks for a recheck.

## 2014-08-28 NOTE — Patient Instructions (Signed)
General Instructions:  Please continue to take Ibuprofen as needed for headaches  You can also take Flexeril 10mg  as needed for muscle spasms.  Please bring your medicines with you each time you come to clinic.  Medicines may include prescription medications, over-the-counter medications, herbal remedies, eye drops, vitamins, or other pills.   Progress Toward Treatment Goals:  Treatment Goal 04/29/2014  Stop smoking smoking the same amount    Self Care Goals & Plans:  Self Care Goal 08/28/2014  Manage my medications take my medicines as prescribed; bring my medications to every visit; refill my medications on time; follow the sick day instructions if I am sick  Monitor my health keep track of my blood pressure; keep track of my weight  Eat healthy foods eat more vegetables; eat fruit for snacks and desserts; eat baked foods instead of fried foods; eat smaller portions  Be physically active find an activity I enjoy  Stop smoking -  Meeting treatment goals -    No flowsheet data found.   Care Management & Community Referrals:  Referral 04/29/2014  Referrals made for care management support none needed  Referrals made to community resources -

## 2014-08-28 NOTE — Progress Notes (Signed)
INTERNAL MEDICINE TEACHING ATTENDING ADDENDUM - Rolen Conger, MD: I reviewed and discussed at the time of visit with the resident Dr. Hoffman, the patient's medical history, physical examination, diagnosis and results of pertinent tests and treatment and I agree with the patient's care as documented.  

## 2014-08-28 NOTE — Progress Notes (Signed)
[facility] Subjective:   Patient ID: Bill [name] male   DOB: [birth year] 54 y.o.   MRN: [id number]  HPI: Bill [name] is a 54 y.o. male with a PMH detailed below who presents for headaches.  He reports he has been having headaches for the last 3 days, notes that it starts in his neck which is tight and spans to eyebrows.  He describes it as squeezing, lasts for 30 mins- a few hours. It is interfering with his sleep.  He reports that Ibuprofen has helped. He denies any visual disturbances, fever/ chills.   Past Medical History  Diagnosis Date  . Seizure disorder     Started at age 61. Last EMR record 05/25/09  . History of tobacco abuse   . Chronic chest pain     Normal stress test in 2003  . Pleural plaque     MULTIPLE BILATERAL PLEURAL PLAQUES, which had not changed on CT as of 1/04  . Avascular necrosis of bone of hip 2007    Right  . Alcohol abuse     Hx of , stopped in 2000. Had 12 pack for 25 years.   . Seizures     PT THINKS GRAND MAL  - LAST TIME PT THINKS WAS 2011  . Arthritis     S/P RIGHT TOTAL HIP ARTHROPLASTY 2007 --PT HAS NOTICED PAIN RIGHT LEG--IS TOLD HE NEEDS RT HIP REVISION   Current Outpatient Prescriptions  Medication Sig Dispense Refill  . ibuprofen (ADVIL,MOTRIN) 200 MG tablet Take 200-400 mg by mouth every 6 (six) hours as needed.    . phenytoin (DILANTIN) 100 MG ER capsule Take 2 capsules (200 mg total) by mouth 2 (two) times daily. 120 capsule 3  . cyclobenzaprine (FLEXERIL) 10 MG tablet Take 1 tablet (10 mg total) by mouth every 8 (eight) hours as needed for muscle spasms. (Patient not taking: Reported on 08/28/2014) 30 tablet 1   No current facility-administered medications for this visit.   Family History  Problem Relation Age of Onset  . Heart attack Father   . Arthritis Mother   . Colon cancer Neg Hx    History   Social History  . Marital Status: Single    Spouse Name: N/A  . Number of Children: N/A  . Years  of Education: N/A   Social History Main Topics  . Smoking status: Current Every Day Smoker -- 0.50 packs/day for 20 years    Types: Cigarettes  . Smokeless tobacco: Never Used     Comment: TRYING TO QUIT  . Alcohol Use: No     Comment: Former  . Drug Use: No  . Sexual Activity: Yes    Birth Control/ Protection: Condom     Comment: 1 Partner,    Other Topics Concern  . None   Social History Narrative   Custodian, single.    Review of Systems: Review of Systems  Constitutional: Negative for fever and chills.  HENT: Negative for ear discharge, ear pain, hearing loss and tinnitus.   Eyes: Negative for blurred vision, double vision and photophobia.  Respiratory: Negative for cough.   Cardiovascular: Negative for chest pain.  Neurological: Positive for headaches.     Objective:  Physical Exam: Filed Vitals:   08/28/14 0854  BP: 124/72  Pulse: 52  Temp: 98.4 F (36.9 C)  TempSrc: Oral  Height: 5\' 6"  (1.676 m)  Weight: 135 lb (61.236 kg)  SpO2: 100%   Physical Exam  Constitutional: He  is well-developed, well-nourished, and in no distress.  HENT:  Head: Normocephalic and atraumatic.  Eyes: Conjunctivae are normal. Pupils are equal, round, and reactive to light.  Cardiovascular: Normal rate and regular rhythm.   Pulmonary/Chest: Effort normal and breath sounds normal.  Musculoskeletal:  Bilateral cervical paraspinal muscle tenderness.  Nursing note and vitals reviewed.    Assessment & Plan:  Case discussed with Dr. Dareen Piano  Seizures - Reports compliance with phenytoin. - Will check phenytoin level  Tubular adenoma of colon Colonoscopy April of 2013, Dr Ardis Hughs recommended repeat in 3 years. - He has an appointment for follow up with Dr. Ardis Hughs and repeat colonoscopy.  Tension headache -Suspect Tension headache from history. - Advised to continue Ibuprofen. Can also apply heat to neck.  Will give Flexeril for muscle spasm (may also help with  sleep)    Medications Ordered Meds ordered this encounter  Medications  . cyclobenzaprine (FLEXERIL) 10 MG tablet    Sig: Take 1 tablet (10 mg total) by mouth every 8 (eight) hours as needed for muscle spasms.    Dispense:  30 tablet    Refill:  1  . ibuprofen (ADVIL,MOTRIN) 200 MG tablet    Sig: Take 200-400 mg by mouth every 6 (six) hours as needed.   Other Orders Orders Placed This Encounter  Procedures  . Phenytoin (Dilantin)  . BMP with Estimated GFR ([id number])   Follow Up: Return if symptoms worsen or fail to improve.

## 2014-08-28 NOTE — Assessment & Plan Note (Signed)
-  Suspect Tension headache from history. - Advised to continue Ibuprofen. Can also apply heat to neck.  Will give Flexeril for muscle spasm (may also help with sleep)

## 2014-08-29 LAB — BASIC METABOLIC PANEL WITH GFR
BUN: 14 mg/dL (ref 6–23)
CHLORIDE: 106 meq/L (ref 96–112)
CO2: 29 meq/L (ref 19–32)
Calcium: 9.2 mg/dL (ref 8.4–10.5)
Creat: 0.86 mg/dL (ref 0.50–1.35)
GFR, Est African American: >89 mL/min
GLUCOSE: 90 mg/dL (ref 70–99)
Potassium: 4.3 mEq/L (ref 3.5–5.3)
Sodium: 143 mEq/L (ref 135–145)

## 2014-08-29 LAB — PHENYTOIN LEVEL, TOTAL: Phenytoin Lvl: <2.5 ug/mL — ABNORMAL LOW (ref 10.0–20.0)

## 2014-08-29 NOTE — Addendum Note (Signed)
Addended by: Lucious Groves on: 08/29/2014 08:23 AM   Modules accepted: Orders

## 2014-09-04 ENCOUNTER — Ambulatory Visit (HOSPITAL_COMMUNITY)
Admission: RE | Admit: 2014-09-04 | Discharge: 2014-09-04 | Disposition: A | Discharge status: Home / Self Care | Payer: 59 | Source: Ambulatory Visit | Attending: Internal Medicine | Admitting: Internal Medicine

## 2014-09-04 DIAGNOSIS — J929 Pleural plaque without asbestos: Secondary | ICD-10-CM | POA: Insufficient documentation

## 2014-09-04 DIAGNOSIS — M479 Spondylosis, unspecified: Secondary | ICD-10-CM | POA: Insufficient documentation

## 2014-09-04 DIAGNOSIS — J432 Centrilobular emphysema: Secondary | ICD-10-CM | POA: Diagnosis not present

## 2014-09-04 DIAGNOSIS — I700 Atherosclerosis of aorta: Secondary | ICD-10-CM | POA: Diagnosis not present

## 2014-09-04 DIAGNOSIS — R918 Other nonspecific abnormal finding of lung field: Secondary | ICD-10-CM | POA: Insufficient documentation

## 2014-09-04 DIAGNOSIS — R590 Localized enlarged lymph nodes: Secondary | ICD-10-CM | POA: Insufficient documentation

## 2014-10-15 ENCOUNTER — Ambulatory Visit (AMBULATORY_SURGERY_CENTER): Payer: Self-pay

## 2014-10-15 VITALS — Ht 66.0 in | Wt 136.8 lb

## 2014-10-15 DIAGNOSIS — Z86010 Personal history of colonic polyps: Secondary | ICD-10-CM

## 2014-10-15 MED ORDER — MOVIPREP 100 G PO SOLR: ORAL | Status: DC

## 2014-10-15 NOTE — Progress Notes (Signed)
Per pt, no allergies to soy or egg products.Pt not taking any weight loss meds or using  O2 at home. 

## 2014-10-24 ENCOUNTER — Other Ambulatory Visit (INDEPENDENT_AMBULATORY_CARE_PROVIDER_SITE_OTHER): Payer: 59

## 2014-10-24 ENCOUNTER — Other Ambulatory Visit: Payer: Self-pay | Admitting: Internal Medicine

## 2014-10-24 DIAGNOSIS — R569 Unspecified convulsions: Secondary | ICD-10-CM | POA: Diagnosis not present

## 2014-10-24 DIAGNOSIS — D126 Benign neoplasm of colon, unspecified: Secondary | ICD-10-CM

## 2014-10-24 NOTE — Progress Notes (Signed)
Patient called today requesting GI referral. As he was found to have a tubular adenoma x 3 in April 2013 following his last colonoscopy, I feel it is appropriate.

## 2014-10-25 LAB — PHENYTOIN LEVEL, TOTAL: Phenytoin (Dilantin), Serum: 3.7 ug/mL — ABNORMAL LOW (ref 10.0–20.0)

## 2014-10-29 ENCOUNTER — Encounter: Payer: Self-pay | Admitting: Gastroenterology

## 2014-10-29 ENCOUNTER — Ambulatory Visit (AMBULATORY_SURGERY_CENTER): Payer: 59 | Admitting: Gastroenterology

## 2014-10-29 VITALS — BP 100/68 | HR 46 | Temp 96.4°F | Resp 21 | Ht 66.0 in | Wt 136.0 lb

## 2014-10-29 DIAGNOSIS — Z86010 Personal history of colonic polyps: Secondary | ICD-10-CM | POA: Diagnosis present

## 2014-10-29 DIAGNOSIS — K5730 Diverticulosis of large intestine without perforation or abscess without bleeding: Secondary | ICD-10-CM

## 2014-10-29 HISTORY — PX: COLONOSCOPY: SHX174

## 2014-10-29 MED ORDER — SODIUM CHLORIDE 0.9 % IV SOLN: 500.0000 mL | INTRAVENOUS | Status: DC

## 2014-10-29 NOTE — Progress Notes (Signed)
Transferred to recovery room. A/O x3, pleased with MAC.  VSS.  Report to Celia, RN. 

## 2014-10-29 NOTE — Patient Instructions (Addendum)
One of your biggest health concerns is your smoking.  This increases your risk for most cancers and serious cardiovascular diseases such as strokes, heart attacks.  You should try your best to stop.  If you need assistance, please contact your PCP or Smoking Cessation Class at [facility] [phone]) or [place] (1-800-QUIT-NOW). If you are still smoking...  YOU HAD AN ENDOSCOPIC PROCEDURE TODAY AT [facility]:   Refer to the procedure report that was given to you for any specific questions about what was found during the examination.  If the procedure report does not answer your questions, please call your gastroenterologist to clarify.  If you requested that your care partner not be given the details of your procedure findings, then the procedure report has been included in a sealed envelope for you to review at your convenience later.  YOU SHOULD EXPECT: Some feelings of bloating in the abdomen. Passage of more gas than usual.  Walking can help get rid of the air that was put into your GI tract during the procedure and reduce the bloating. If you had a lower endoscopy (such as a colonoscopy or flexible sigmoidoscopy) you may notice spotting of blood in your stool or on the toilet paper. If you underwent a bowel prep for your procedure, you may not have a normal bowel movement for a few days.  Please Note:  You might notice some irritation and congestion in your nose or some drainage.  This is from the oxygen used during your procedure.  There is no need for concern and it should clear up in a day or so.  SYMPTOMS TO REPORT IMMEDIATELY:   Following lower endoscopy (colonoscopy or flexible sigmoidoscopy):  Excessive amounts of blood in the stool  Significant tenderness or worsening of abdominal pains  Swelling of the abdomen that is new, acute  Fever of 100F or higher   For urgent or emergent issues, a gastroenterologist can be reached at any hour by calling  [phone].   DIET: Your first meal following the procedure should be a small meal and then it is ok to progress to your normal diet. Heavy or fried foods are harder to digest and may make you feel nauseous or bloated.  Likewise, meals heavy in dairy and vegetables can increase bloating.  Drink plenty of fluids but you should avoid alcoholic beverages for 24 hours. Increase the fiber in your diet.  ACTIVITY:  You should plan to take it easy for the rest of today and you should NOT DRIVE or use heavy machinery until tomorrow (because of the sedation medicines used during the test).    FOLLOW UP: Our staff will call the number listed on your records the next business day following your procedure to check on you and address any questions or concerns that you may have regarding the information given to you following your procedure. If we do not reach you, we will leave a message.  However, if you are feeling well and you are not experiencing any problems, there is no need to return our call.  We will assume that you have returned to your regular daily activities without incident.  If any biopsies were taken you will be contacted by phone or by letter within the next 1-3 weeks.  Please call us at [phone] if you have not heard about the biopsies in 3 weeks.    SIGNATURES/CONFIDENTIALITY: You and/or your care partner have signed paperwork which will be entered into your  electronic medical record.  These signatures attest to the fact that that the information above on your After Visit Summary has been reviewed and is understood.  Full responsibility of the confidentiality of this discharge information lies with you and/or your care-partner.  Thank-you for choosing [name] for your healthcare needs. Read the handouts given to you by your recovery room nurse.

## 2014-10-29 NOTE — Op Note (Signed)
[facility]  [address] [place], [id number]   COLONOSCOPY PROCEDURE REPORT  PATIENT: [name], Bill  MR#: [id number] BIRTHDATE: 1960/03/21 , 50  yrs. old GENDER: male ENDOSCOPIST: Milus Banister, MD PROCEDURE DATE:  10/29/2014 PROCEDURE:   Colonoscopy, surveillance First Screening Colonoscopy - Avg.  risk and is 50 yrs.  old or older - No.  Prior Negative Screening - Now for repeat screening. N/A  History of Adenoma - Now for follow-up colonoscopy & has been > or = to 3 yrs.  Yes hx of adenoma.  Has been 3 or more years since last colonoscopy.  high risk ASA CLASS:   Class II INDICATIONS:Surveillance due to prior colonic neoplasia and Colonoscopy 2013 Dr.  Ardis Hughs, found 3 adenomas, one was 1.5cm. MEDICATIONS: Monitored anesthesia care and Propofol 175 mg IV  DESCRIPTION OF PROCEDURE:   After the risks benefits and alternatives of the procedure were thoroughly explained, informed consent was obtained.  The digital rectal exam revealed no abnormalities of the rectum.   The LB [id number]  endoscope was introduced through the anus and advanced to the cecum, which was identified by both the appendix and ileocecal valve. No adverse events experienced.   The quality of the prep was excellent.  The instrument was then slowly withdrawn as the colon was fully examined. Estimated blood loss is zero unless otherwise noted in this procedure report.   COLON FINDINGS: There was mild diverticulosis noted in the left colon.   The examination was otherwise normal.  Retroflexed views revealed no abnormalities. The time to cecum = 1.8 Withdrawal time = 6.7   The scope was withdrawn and the procedure completed. COMPLICATIONS: There were no immediate complications.  ENDOSCOPIC IMPRESSION: 1.   Mild diverticulosis was noted in the left colon 2.   The examination was otherwise normal (no polyps or cancers)  RECOMMENDATIONS: Given your personal history of adenomatous  (pre-cancerous) polyps, you will need a repeat colonoscopy in 5 years.  eSigned:  Milus Banister, MD 10/29/2014 9:30 AM   cc: Charlott Rakes, MD

## 2014-10-30 ENCOUNTER — Telehealth: Payer: Self-pay

## 2014-10-30 NOTE — Telephone Encounter (Signed)
  Follow up Call-  Call back number 10/29/2014  Post procedure Call Back phone  # [phone]  Permission to leave phone message Yes     Patient questions:  Do you have a fever, pain , or abdominal swelling? No. Pain Score  0 *  Have you tolerated food without any problems? Yes.    Have you been able to return to your normal activities? Yes.    Do you have any questions about your discharge instructions: Diet   No. Medications  No. Follow up visit  No.  Do you have questions or concerns about your Care? No.  Actions: * If pain score is 4 or above: No action needed, pain <4.

## 2014-11-15 ENCOUNTER — Other Ambulatory Visit: Payer: Self-pay | Admitting: Internal Medicine

## 2014-11-15 DIAGNOSIS — R569 Unspecified convulsions: Secondary | ICD-10-CM

## 2014-11-15 MED ORDER — PHENYTOIN SODIUM EXTENDED 100 MG PO CAPS: 200.0000 mg | ORAL_CAPSULE | Freq: Two times a day (BID) | ORAL | Status: DC

## 2014-11-15 NOTE — Telephone Encounter (Signed)
Pt requesting dilantin to be filled.

## 2015-05-24 ENCOUNTER — Other Ambulatory Visit: Payer: Self-pay | Admitting: Student in an Organized Health Care Education/Training Program

## 2015-05-26 NOTE — Telephone Encounter (Signed)
Pt needs appointment please 

## 2015-05-26 NOTE — Telephone Encounter (Signed)
He needs to make appt in next 60 days to cont to receive RF.

## 2015-05-27 NOTE — Telephone Encounter (Signed)
Called patient this am, but no answer.  Left message letting him know he has an appt for 07-10-15 @ 9:15 am.  Appt card has been mailed.

## 2015-07-10 ENCOUNTER — Ambulatory Visit: Payer: Self-pay | Admitting: Internal Medicine

## 2015-07-11 ENCOUNTER — Telehealth: Payer: Self-pay | Admitting: Internal Medicine

## 2015-07-11 NOTE — Telephone Encounter (Signed)
PT. REMINDER CALL, LMTCB °

## 2015-07-14 ENCOUNTER — Encounter: Payer: Self-pay | Admitting: Internal Medicine

## 2015-07-14 ENCOUNTER — Ambulatory Visit (INDEPENDENT_AMBULATORY_CARE_PROVIDER_SITE_OTHER): Payer: BLUE CROSS/BLUE SHIELD | Admitting: Internal Medicine

## 2015-07-14 VITALS — BP 118/62 | HR 74 | Temp 97.6°F | Ht 66.0 in | Wt 139.6 lb

## 2015-07-14 DIAGNOSIS — Z1159 Encounter for screening for other viral diseases: Secondary | ICD-10-CM | POA: Diagnosis not present

## 2015-07-14 DIAGNOSIS — Z9114 Patient's other noncompliance with medication regimen: Secondary | ICD-10-CM

## 2015-07-14 DIAGNOSIS — G40909 Epilepsy, unspecified, not intractable, without status epilepticus: Secondary | ICD-10-CM | POA: Diagnosis not present

## 2015-07-14 DIAGNOSIS — R569 Unspecified convulsions: Secondary | ICD-10-CM

## 2015-07-14 DIAGNOSIS — F17210 Nicotine dependence, cigarettes, uncomplicated: Secondary | ICD-10-CM

## 2015-07-14 DIAGNOSIS — Z114 Encounter for screening for human immunodeficiency virus [HIV]: Secondary | ICD-10-CM | POA: Diagnosis not present

## 2015-07-14 DIAGNOSIS — Z720 Tobacco use: Secondary | ICD-10-CM

## 2015-07-14 MED ORDER — NICOTINE POLACRILEX 2 MG MT GUM: 2.0000 mg | CHEWING_GUM | OROMUCOSAL | Status: DC | PRN

## 2015-07-14 NOTE — Progress Notes (Signed)
Internal Medicine Clinic Attending  Case discussed with Dr. Patel,Rushil soon after the resident saw the patient.  We reviewed the resident's history and exam and pertinent patient test results.  I agree with the assessment, diagnosis, and plan of care documented in the resident's note. 

## 2015-07-14 NOTE — Progress Notes (Signed)
   Subjective:    Patient ID: Bill [name], male    DOB: 10-21-1960, 55 y.o.   MRN: [id number]  HPI Bill [name] is a 55 year old male with seizure disorder and current tobacco abuse complicated by multiple pulmonary nodules who presents today for follow-up visit. Please see assessment & plan for documentation of each problem.   Review of Systems  Constitutional: Negative for appetite change and unexpected weight change.  Respiratory: Negative for shortness of breath.   Cardiovascular: Negative for chest pain.  Gastrointestinal: Negative for abdominal pain.  Neurological: Negative for seizures and syncope.  Psychiatric/Behavioral: Negative for suicidal ideas and self-injury.       Objective:   Physical Exam  Constitutional: He is oriented to person, place, and time. He appears well-developed and well-nourished. No distress.  HENT:  Head: Normocephalic and atraumatic.  Eyes: Conjunctivae are normal. No scleral icterus.  Neck: No tracheal deviation present.  Cardiovascular: Normal rate and regular rhythm.   Pulmonary/Chest: Effort normal. No stridor. No respiratory distress.  Neurological: He is alert and oriented to person, place, and time.  Skin: Skin is warm and dry. He is not diaphoretic.          Assessment & Plan:

## 2015-07-14 NOTE — Patient Instructions (Signed)
How should I use this medicine? Chew but do not swallow the gum.   When you feel an urgent desire for a cigarette, chew one piece of gum slowly.   Continue chewing until you taste the gum or feel a slight tingling in your mouth.   Then, stop chewing and place the gum between your cheek and gum.   Wait until the taste or tingling is almost gone then start chewing again.   Continue chewing in this manner for about 30 minutes.   Slow chewing helps reduce cravings and also helps reduce the chance for heartburn or other gastrointestinal side effects.    What should I watch for while using this medicine? Always carry the nicotine gum with you. Do not use more than 30 pieces of gum a day.  As the urge to smoke gets less, gradually reduce the number of pieces each day over a period of 2 to 3 months. When you are only using 1 or 2 pieces a day, stop using the nicotine gum.  You should begin using the nicotine gum the day you stop smoking. It is okay if you do not succeed with the attempt to quit and have a cigarette. You can still continue your quit attempt and keep using the product as directed. Just throw away your cigarettes and get back to your quit plan.  If your mouth gets sore from chewing the gum, suck hard sugarless candy between pieces of gum to help relieve the soreness.   Brush your teeth regularly to reduce mouth irritation.

## 2015-07-14 NOTE — Assessment & Plan Note (Addendum)
Overview He denies having any recent seizures in the interval following her last office visit. Looking at his medication bottle, it appears he last refilled his medication back in January though the EMR notes this as March. He does acknowledge missing one dose a week which amounts to 4 pills and may account for why he still has his January bottle. He does acknowledge also playing video games which serve as potential triggers for his epileptiform activity.  Assessment History of seizure disorder and nonadherence to medication. I know cost was prohibitive in my experience working with him though he now has private coverage.  Plan -Check CMET, phenytoin level, CBC, vitamin D as a part of monitoring parameters for ongoing phenytoin therapy -Continue phenytoin ER 200 mg twice daily for now though suspect we will have to increase his regimen due to subtherapeutic levels  ADDENDUM 07/16/2015  11:17 AM:  Vitamin D low normal at 31.8, so I will recommend supplementation of follow-up to prevent severe deficiency down the road.  Dilantin 5.2 on check which is subtherapeutic since goal for him would be 10-20. No hepatic or renal dysfunction noted which would alter the true value. I will get in touch with the pharmacy team to decide optimal dosing regimen.  CMET notable for mildly elevated alkaline phosphatase 124 which appears consistent per review of the limited prior values available in our chart.

## 2015-07-14 NOTE — Assessment & Plan Note (Addendum)
Overview He is agreeable today to be checked for HIV and hepatitis C.  Assessment Eligible for HCV screening by birth year  Plan Check HIV and HCV  ADDENDUM 07/16/2015  11:16 AM:  HIV, HCV negative.

## 2015-07-14 NOTE — Assessment & Plan Note (Addendum)
Overview He reports to me today that he is interested in cutting back on his smoking habit. He currently smokes half a pack per day and has done so for the last 20 years. He feels he smokes less at home and at work due to his peers though he feels they would support him or he did decide to cut back. He does have nicotine patches at home though is interested in trying anything else so long as it does not cross react with his antiepileptic medication.  Assessment Ongoing tobacco use with motivation to quit. Given his seizure disorder history, I do not feel comfortable pursuing oral pharmacotherapy with agents like bupropion, Chantix.  Plan -Recommended he continue using nicotine patches and augment with nicotine gum -Provided him with instructions and counseled him on the appropriate use of the gum -Plan to follow-up next week to reassess how he is doing -Check lipid panel today to assess need for statin therapy  ADDENDUM 07/16/2015  11:19 AM:  Per 2013 AHA/ACC ASCVD risk calculator, 10-year risk is 9.2 % and 4.9 % with optimal risk factors. Moderate to high intensity statin therapy is indicated, so we'll discuss at follow-up.

## 2015-07-15 LAB — LIPID PANEL
CHOL/HDL RATIO: 3.3 ratio (ref 0.0–5.0)
Cholesterol, Total: 228 mg/dL — ABNORMAL HIGH (ref 100–199)
HDL: 70 mg/dL (ref >39)
LDL Calculated: 141 mg/dL — ABNORMAL HIGH (ref 0–99)
Triglycerides: 83 mg/dL (ref 0–149)
VLDL Cholesterol Cal: 17 mg/dL (ref 5–40)

## 2015-07-15 LAB — CMP14 + ANION GAP
ALBUMIN: 4.3 g/dL (ref 3.5–5.5)
ALT: 24 IU/L (ref 0–44)
ANION GAP: 16 mmol/L (ref 10.0–18.0)
AST: 23 IU/L (ref 0–40)
Albumin/Globulin Ratio: 1.3 (ref 1.2–2.2)
Alkaline Phosphatase: 124 IU/L — ABNORMAL HIGH (ref 39–117)
BUN / CREAT RATIO: 19 (ref 9–20)
BUN: 18 mg/dL (ref 6–24)
Bilirubin Total: <0.2 mg/dL (ref 0.0–1.2)
CALCIUM: 9.6 mg/dL (ref 8.7–10.2)
CO2: 24 mmol/L (ref 18–29)
CREATININE: 0.93 mg/dL (ref 0.76–1.27)
Chloride: 101 mmol/L (ref 96–106)
GFR, EST AFRICAN AMERICAN: 106 mL/min/{1.73_m2} (ref >59)
GFR, EST NON AFRICAN AMERICAN: 92 mL/min/{1.73_m2} (ref >59)
GLOBULIN, TOTAL: 3.2 g/dL (ref 1.5–4.5)
Glucose: 89 mg/dL (ref 65–99)
Potassium: 4.6 mmol/L (ref 3.5–5.2)
SODIUM: 141 mmol/L (ref 134–144)
TOTAL PROTEIN: 7.5 g/dL (ref 6.0–8.5)

## 2015-07-15 LAB — HIV ANTIBODY (ROUTINE TESTING W REFLEX): HIV Screen 4th Generation wRfx: NONREACTIVE

## 2015-07-15 LAB — CBC WITH DIFFERENTIAL/PLATELET
BASOS: 1 %
Basophils Absolute: 0.1 10*3/uL (ref 0.0–0.2)
EOS (ABSOLUTE): 0.2 10*3/uL (ref 0.0–0.4)
EOS: 4 %
HEMOGLOBIN: 14.4 g/dL (ref 12.6–17.7)
Hematocrit: 42.4 % (ref 37.5–51.0)
Immature Grans (Abs): 0 10*3/uL (ref 0.0–0.1)
Immature Granulocytes: 0 %
LYMPHS ABS: 1.6 10*3/uL (ref 0.7–3.1)
Lymphs: 29 %
MCH: 28.4 pg (ref 26.6–33.0)
MCHC: 34 g/dL (ref 31.5–35.7)
MCV: 84 fL (ref 79–97)
MONOCYTES: 6 %
MONOS ABS: 0.3 10*3/uL (ref 0.1–0.9)
Neutrophils Absolute: 3.3 10*3/uL (ref 1.4–7.0)
Neutrophils: 60 %
PLATELETS: 262 10*3/uL (ref 150–379)
RBC: 5.07 x10E6/uL (ref 4.14–5.80)
RDW: 14.5 % (ref 12.3–15.4)
WBC: 5.5 10*3/uL (ref 3.4–10.8)

## 2015-07-15 LAB — VITAMIN D 25 HYDROXY (VIT D DEFICIENCY, FRACTURES): VIT D 25 HYDROXY: 31.8 ng/mL (ref 30.0–100.0)

## 2015-07-15 LAB — HEPATITIS C ANTIBODY: Hep C Virus Ab: <0.1 s/co ratio (ref 0.0–0.9)

## 2015-07-15 LAB — PHENYTOIN LEVEL, TOTAL: Phenytoin (Dilantin), Serum: 5.2 ug/mL — ABNORMAL LOW (ref 10.0–20.0)

## 2015-07-21 ENCOUNTER — Ambulatory Visit: Payer: Self-pay | Admitting: Internal Medicine

## 2015-07-21 ENCOUNTER — Encounter: Payer: Self-pay | Admitting: Internal Medicine

## 2015-07-23 ENCOUNTER — Ambulatory Visit (INDEPENDENT_AMBULATORY_CARE_PROVIDER_SITE_OTHER): Payer: BLUE CROSS/BLUE SHIELD | Admitting: Internal Medicine

## 2015-07-23 ENCOUNTER — Encounter: Payer: Self-pay | Admitting: Internal Medicine

## 2015-07-23 VITALS — BP 119/67 | HR 65 | Temp 97.6°F | Ht 66.0 in | Wt 141.8 lb

## 2015-07-23 DIAGNOSIS — R569 Unspecified convulsions: Secondary | ICD-10-CM

## 2015-07-23 DIAGNOSIS — E785 Hyperlipidemia, unspecified: Secondary | ICD-10-CM | POA: Diagnosis not present

## 2015-07-23 DIAGNOSIS — G40909 Epilepsy, unspecified, not intractable, without status epilepticus: Secondary | ICD-10-CM

## 2015-07-23 DIAGNOSIS — F17200 Nicotine dependence, unspecified, uncomplicated: Secondary | ICD-10-CM

## 2015-07-23 DIAGNOSIS — Z720 Tobacco use: Secondary | ICD-10-CM

## 2015-07-23 DIAGNOSIS — M25572 Pain in left ankle and joints of left foot: Secondary | ICD-10-CM | POA: Insufficient documentation

## 2015-07-23 MED ORDER — PHENYTOIN SODIUM EXTENDED 200 MG PO CAPS: 200.0000 mg | ORAL_CAPSULE | Freq: Two times a day (BID) | ORAL | Status: DC

## 2015-07-23 MED ORDER — NICOTINE 14 MG/24HR TD PT24: 14.0000 mg | MEDICATED_PATCH | TRANSDERMAL | Status: DC

## 2015-07-23 MED ORDER — ATORVASTATIN CALCIUM 40 MG PO TABS: 40.0000 mg | ORAL_TABLET | Freq: Every day | ORAL | Status: DC

## 2015-07-23 NOTE — Patient Instructions (Signed)
For your toe pain, try ibuprofen 400-600mg  next time to see if it makes the pain go away quicker.   For your cholesterol, please start taking atorvatstatin (Lipitor) 40mg  daily.   For your seizures, please pick up the Dilantin at the pharmacy. Take 1 tablet twice daily of the new prescription AND 1 tablet of the old prescription.  Keep taking the Centrum multivitamin to keep up the Vitamin D.  We will call over there to make sure all the prescriptions have been sent over.  Please see me back in 4 weeks for repeat bloodwork.

## 2015-07-23 NOTE — Assessment & Plan Note (Signed)
Overview He reports today that he was unable to pick up the nicotine gum which I prescribed him at his last visit. He has also brought with him a 21 g nicotine patch though has expired would also like a refill for these as well.  Assessment Ongoing tobacco abuse with self-motivation. He is currently smoking half a pack per day is agreeable to try nicotine replacement therapy.  Plan -Prescribed nicotine 14 mg patch 30 along with nicotine 2 mg gum 100 -Asked RN to verify that these prescriptions had indeed reached the pharmacy which they did. -Follow-up in 1 month to reassess response to therapy

## 2015-07-23 NOTE — Assessment & Plan Note (Addendum)
Overview I reviewed with him subtherapeutic level of Dilantin 5.2 which she did not find surprising. He reports he struggles with remembering to take this medication as he keeps it doctor when his back pocket. He does take the medication with meals, and I encouraged him to place the medication in an area where he is able to see it so that he remembers to take it. He is agreeable to switching to 200mg  tablets to cut down his pill burden which may improve his adherence.  We also reviewed his low normal vitamin D level, and he acknowledges to me he is taking Centrum multivitamin which does have vitamin D3 1000 units per tablet.   Assessment History of seizure disorder complicated by nonadherence to medication.   Plan -Refilled phenytoin 200 mg extended release capsule to be taken one tablet twice daily with an extra 100 mg extended release capsule from prior prescription for total 500 mg dose so that he is able to reach therapeutic level in one month at recheck -Encouraged him to continue taking Centrum multivitamin supplement to prevent vitamin D deficiency which is associated with phenytoin therapy

## 2015-07-23 NOTE — Progress Notes (Signed)
   Subjective:    Patient ID: Bill [name], male    DOB: 1960-11-04, 55 y.o.   MRN: [id number]  HPI Bill [name] is a 55 year old male with seizure disorder and current tobacco abuse complicated by multiple pulmonary nodules who presents today for follow-up visit. Please see assessment & plan for documentation of each problem.   Review of Systems  Constitutional: Negative for appetite change and unexpected weight change.  Respiratory: Negative for shortness of breath.   Cardiovascular: Negative for chest pain.  Gastrointestinal: Negative for abdominal pain.  Musculoskeletal: Positive for joint swelling.  Neurological: Negative for seizures and syncope.  Psychiatric/Behavioral: Negative for suicidal ideas and self-injury.       Objective:   Physical Exam  Constitutional: He is oriented to person, place, and time. He appears well-developed and well-nourished. No distress.  HENT:  Head: Normocephalic and atraumatic.  Eyes: Conjunctivae are normal. No scleral icterus.  Neck: No tracheal deviation present.  Cardiovascular: Normal rate and regular rhythm.   Pulmonary/Chest: Effort normal. No stridor. No respiratory distress.  Musculoskeletal:  Left ankle joint without exquisite tenderness to palpation, erythema, warmth, asymmetric swelling as compared to the right ankle joint.  Neurological: He is alert and oriented to person, place, and time.  Skin: Skin is warm and dry. He is not diaphoretic.          Assessment & Plan:

## 2015-07-23 NOTE — Assessment & Plan Note (Signed)
Overview Per 2013 AHA/ACC ASCVD risk calculator, 10-year risk is 9.2 % and 4.9 % with optimal risk factors. Moderate to high intensity statin therapy is indicated which I discussed with him today. He does have positive family history as his father had a heart attack in his 10s. He also continues to smoke half a pack per day though is interested in quitting.  Assessment Hyperlipidemia has a consequence of ongoing tobacco use. Other risk factor includes positive family history.  Plan -Start atorvastatin 40 mg daily with plan to reassess at follow-up with lipid profile and CMET

## 2015-07-23 NOTE — Assessment & Plan Note (Signed)
After his visit with me last week, he noted the acute onset of left ankle swelling/pain. He cannot describe how long it lasted though he felt lying in bed help to resolve within 1 day. He has had this problem on and off for some time now and is wondering if it is gout. I told him to try ibuprofen 400-600 mg at the acute onset of pain and swelling should this happen again to see if it makes the symptoms resolve faster. I reassured him that there were no concerning physical exam findings.

## 2015-07-24 NOTE — Progress Notes (Signed)
Medicine attending: Medical history, presenting problems, physical findings, and medications, reviewed with resident physician Dr Rushil Patel on the day of the patient visit and I concur with his evaluation and management plan. 

## 2015-08-17 ENCOUNTER — Other Ambulatory Visit: Payer: Self-pay | Admitting: Internal Medicine

## 2015-08-17 DIAGNOSIS — E785 Hyperlipidemia, unspecified: Secondary | ICD-10-CM

## 2015-08-17 DIAGNOSIS — R569 Unspecified convulsions: Secondary | ICD-10-CM

## 2015-08-20 ENCOUNTER — Other Ambulatory Visit: Payer: Self-pay

## 2015-08-21 ENCOUNTER — Other Ambulatory Visit (INDEPENDENT_AMBULATORY_CARE_PROVIDER_SITE_OTHER): Payer: BLUE CROSS/BLUE SHIELD

## 2015-08-21 DIAGNOSIS — E785 Hyperlipidemia, unspecified: Secondary | ICD-10-CM

## 2015-08-21 DIAGNOSIS — R569 Unspecified convulsions: Secondary | ICD-10-CM

## 2015-08-22 LAB — LIPID PANEL
Chol/HDL Ratio: 3 ratio units (ref 0.0–5.0)
Cholesterol, Total: 184 mg/dL (ref 100–199)
HDL: 61 mg/dL (ref >39)
LDL Calculated: 105 mg/dL — ABNORMAL HIGH (ref 0–99)
TRIGLYCERIDES: 90 mg/dL (ref 0–149)
VLDL Cholesterol Cal: 18 mg/dL (ref 5–40)

## 2015-08-22 LAB — PHENYTOIN LEVEL, TOTAL: PHENYTOIN (DILANTIN), SERUM: 8.4 ug/mL — AB (ref 10.0–20.0)

## 2015-08-24 ENCOUNTER — Telehealth: Payer: Self-pay | Admitting: Internal Medicine

## 2015-08-24 NOTE — Telephone Encounter (Signed)
Dr. Maudie Mercury, I was hoping you could help with these two things.  Phenytoin level 8.4, increased from 5.2 from 1 month ago which is reflective of improved adherence to his antiepileptic therapy. However, he is still subtherapeutic. How should we increase his dose now? He was on 500mg  total dosing of ER formulation after last month. I wonder if we should hold the course for another month to see if it improves.  Lipid panel shows response to high intensity statin therapy though he should be running out of his medication as I only filled it for 30 days. Can you see how he tolerated it?   Thank you.

## 2015-08-26 NOTE — Telephone Encounter (Signed)
Consult

## 2015-09-22 ENCOUNTER — Other Ambulatory Visit: Payer: Self-pay | Admitting: Internal Medicine

## 2015-09-22 NOTE — Telephone Encounter (Signed)
Pt requesting his Nicotine patches to be refilled

## 2015-09-23 NOTE — Telephone Encounter (Signed)
Called, lm for rtc 

## 2015-09-23 NOTE — Telephone Encounter (Signed)
I would like for him to schedule a follow-up appointment with me before I refill this medication and left a message to that effect on his voicemail.  He is also overdue 1 month to refill his atorvastatin for which I would like to reassess his adherence not to mention rechecking his phenytoin level.

## 2015-09-25 NOTE — Telephone Encounter (Signed)
Tried to call, lm for rtc 

## 2015-09-25 NOTE — Telephone Encounter (Signed)
Made pt an appt for 7/21

## 2015-09-26 ENCOUNTER — Ambulatory Visit (INDEPENDENT_AMBULATORY_CARE_PROVIDER_SITE_OTHER): Payer: BLUE CROSS/BLUE SHIELD | Admitting: Internal Medicine

## 2015-09-26 ENCOUNTER — Other Ambulatory Visit: Payer: Self-pay | Admitting: Internal Medicine

## 2015-09-26 ENCOUNTER — Encounter: Payer: Self-pay | Admitting: Internal Medicine

## 2015-09-26 VITALS — BP 114/63 | HR 60 | Temp 98.0°F | Ht 66.0 in | Wt 146.0 lb

## 2015-09-26 DIAGNOSIS — E785 Hyperlipidemia, unspecified: Secondary | ICD-10-CM

## 2015-09-26 DIAGNOSIS — M25572 Pain in left ankle and joints of left foot: Secondary | ICD-10-CM

## 2015-09-26 DIAGNOSIS — R569 Unspecified convulsions: Secondary | ICD-10-CM

## 2015-09-26 DIAGNOSIS — F17210 Nicotine dependence, cigarettes, uncomplicated: Secondary | ICD-10-CM | POA: Diagnosis not present

## 2015-09-26 DIAGNOSIS — Z720 Tobacco use: Secondary | ICD-10-CM

## 2015-09-26 MED ORDER — NICOTINE 14 MG/24HR TD PT24: 14.0000 mg | MEDICATED_PATCH | TRANSDERMAL | Status: DC

## 2015-09-26 MED ORDER — ATORVASTATIN CALCIUM 40 MG PO TABS: 40.0000 mg | ORAL_TABLET | Freq: Every day | ORAL | Status: DC

## 2015-09-26 MED ORDER — IBUPROFEN 800 MG PO TABS: 800.0000 mg | ORAL_TABLET | Freq: Three times a day (TID) | ORAL | Status: DC | PRN

## 2015-09-26 NOTE — Assessment & Plan Note (Addendum)
Assessment Since his last visit, he has been taking phenytoin 500 mg daily the recently ran out of the supplemental 100 mg tablets but is not sure as he has not brought any bottles in today. He reports trying to take 500 mg at once but experienced dizziness.  Plan -Check total phenytoin level given that he has appropriate albumin and renal function with plan to call him for dose adjustments over the phone

## 2015-09-26 NOTE — Assessment & Plan Note (Signed)
Assessment Since his last office visit, he has dropped smoking half a pack per day to 2 cigarettes a day as wondering if he needs to be on an increased strength of nicotine patch. He still feels an urge to smoke a cigarette upon awakening with his morning coffee, and I expressed optimism that he will be able to kick his habit given the strides he has made since his last office visit. He does not find the nicotine gum all that helpful and prefers patches instead.  Plan -Prescribe nicotine 14 mg patches explained to him that this dosage corresponds to the amount that he was smoking

## 2015-09-26 NOTE — Assessment & Plan Note (Addendum)
Assessment He reports tolerating atorvastatin 40 mg daily well though I pointed out that his 30 day supply which was prescribed back in May should have ran out by now. He openly discloses he still had a few tablets left over from that prescription and acknowledged nonadherence. I explained to him again the rationale for initiating therapy given his risk for cardiovascular disease which may be somewhat lower in the setting of reduced tobacco use though certainly warranted risk factor modification. He does not report signs of statin intolerance, like myalgias, weakness.  Plan -Prescribe atorvastatin 40 mg daily

## 2015-09-26 NOTE — Progress Notes (Signed)
   Subjective:    Patient ID: Bill [name], male    DOB: 1960-10-02, 55 y.o.   MRN: [id number]  HPI Bill [name] is a 55 year old male who presents today for smoking cessation. Please see assessment & plan for status of chronic medical problems.  Review of Systems A pertinent review of systems can be found underneath each problem charted below.    Objective:   Physical Exam  Constitutional: He is oriented to person, place, and time. He appears well-developed and well-nourished. No distress.  HENT:  Head: Normocephalic and atraumatic.  Eyes: Conjunctivae are normal. No scleral icterus.  Neck: No tracheal deviation present.  Cardiovascular: Normal rate and regular rhythm.   Pulmonary/Chest: Effort normal. No stridor. No respiratory distress.  Musculoskeletal:  Tenderness to palpation overlying the skin just superior to the left medial malleolus though no pain reproduced with repetitive tapping of the tarsal tunnel or with passive range of motion maneuvers, like eversion, inversion, dorsiflexion, plantar flexion.  Neurological: He is alert and oriented to person, place, and time.  Skin: Skin is warm and dry. He is not diaphoretic.  Clubbing noted of hands and feet bilaterally.          Assessment & Plan:

## 2015-09-26 NOTE — Assessment & Plan Note (Signed)
Assessment Since his last office visit, he has had several episodes of this pain is unable to identify the frequency at which it occurs. Rest appears to relieve it, but ibuprofen 400 mg has not offered him adequate analgesia. He is afraid to take any other medication as he knows phenytoin often has cross interactions with medications. He again cannot recall how long it has been occurring other than it may be a few months.  Physical exam findings raise concern for tarsal tunnel syndrome given the location though no pain was reproduced with repetitive tapping over the tarsal tunnel. No pain elicited with passive range of motion which points away from a sprain or strain, especially in the absence of a history of prior trauma.  Plan -Prescribed ibuprofen 800 mg to be taken up to 3 times daily with meals -Explained to the patient that this may be due to underlying arthritis for which he may find relief with steroid injections or surgical management should anti-inflammatory medication not help

## 2015-09-26 NOTE — Patient Instructions (Signed)
For your ankle pain, please try ibuprofen 800mg  up to three days as need. Please be sure to take with food to avoid any stomach problems.  For your seizures, I will let you know what we will do with the dosing.  For the smoking, keep up the good work!  Please see me back in 3 months (or sooner) if things change.

## 2015-09-27 LAB — PHENYTOIN LEVEL, TOTAL: PHENYTOIN (DILANTIN), SERUM: 6.5 ug/mL — AB (ref 10.0–20.0)

## 2015-09-29 NOTE — Progress Notes (Signed)
Internal Medicine Clinic Attending  I saw and evaluated the patient.  I personally confirmed the key portions of the history and exam documented by Dr. Patel,Rushil and I reviewed pertinent patient test results.  The assessment, diagnosis, and plan were formulated together and I agree with the documentation in the resident's note. 

## 2015-11-19 ENCOUNTER — Encounter: Payer: Self-pay | Admitting: *Deleted

## 2015-12-26 ENCOUNTER — Encounter: Payer: Self-pay | Admitting: Internal Medicine

## 2016-01-01 ENCOUNTER — Telehealth: Payer: Self-pay | Admitting: Internal Medicine

## 2016-01-01 NOTE — Progress Notes (Signed)
   CC: Seizures  HPI:  [name] is a 55 y.o. male who presents today for seizures. Please see assessment & plan for status of chronic medical problems.   Past Medical History:  Diagnosis Date  . Alcohol abuse    Hx of , stopped in 2000. Had 12 pack for 25 years.   . Arthritis    S/P RIGHT TOTAL HIP ARTHROPLASTY 2007 --PT HAS NOTICED PAIN RIGHT LEG--IS TOLD HE NEEDS RT HIP REVISION  . Avascular necrosis of bone of hip ([place]) 2007   Right  . Chronic chest pain    Normal stress test in 2003  . History of tobacco abuse   . Pleural plaque    MULTIPLE BILATERAL PLEURAL PLAQUES, which had not changed on CT as of 1/04  . Seizure disorder ([place])    Started at age 22. Last EMR record 05/25/09  . Seizures ([place])    PT THINKS GRAND MAL  - LAST TIME PT THINKS WAS 2011    Review of Systems:  Please see each problem below for a pertinent review of systems.  Physical Exam:  Vitals:   01/02/16 1326  BP: 111/67  Pulse: 97  Temp: 97.8 F (36.6 C)  TempSrc: Oral  SpO2: 98%  Weight: 141 lb 8 oz (64.2 kg)  Height: 5\' 6"  (1.676 m)   Physical Exam  Constitutional: He is oriented to person, place, and time. No distress.  HENT:  Head: Normocephalic and atraumatic.  Eyes: Conjunctivae are normal. No scleral icterus.  Cardiovascular: Normal rate and regular rhythm.   Pulmonary/Chest: Effort normal. No respiratory distress.  Neurological: He is alert and oriented to person, place, and time.  Skin: He is not diaphoretic.    Assessment & Plan:   See Encounters Tab for problem based charting.  Patient discussed with Dr. Angelia Mould

## 2016-01-01 NOTE — Telephone Encounter (Signed)
APT. REMINDER CALL, LMTCB °

## 2016-01-02 ENCOUNTER — Ambulatory Visit (INDEPENDENT_AMBULATORY_CARE_PROVIDER_SITE_OTHER): Payer: BLUE CROSS/BLUE SHIELD | Admitting: Internal Medicine

## 2016-01-02 ENCOUNTER — Encounter: Payer: Self-pay | Admitting: Internal Medicine

## 2016-01-02 VITALS — BP 111/67 | HR 97 | Temp 97.8°F | Ht 66.0 in | Wt 141.5 lb

## 2016-01-02 DIAGNOSIS — G40909 Epilepsy, unspecified, not intractable, without status epilepticus: Secondary | ICD-10-CM

## 2016-01-02 DIAGNOSIS — Z79899 Other long term (current) drug therapy: Secondary | ICD-10-CM

## 2016-01-02 DIAGNOSIS — Z720 Tobacco use: Secondary | ICD-10-CM

## 2016-01-02 DIAGNOSIS — R569 Unspecified convulsions: Secondary | ICD-10-CM

## 2016-01-02 DIAGNOSIS — F17210 Nicotine dependence, cigarettes, uncomplicated: Secondary | ICD-10-CM | POA: Diagnosis not present

## 2016-01-02 DIAGNOSIS — Z23 Encounter for immunization: Secondary | ICD-10-CM

## 2016-01-02 DIAGNOSIS — M25572 Pain in left ankle and joints of left foot: Secondary | ICD-10-CM

## 2016-01-02 MED ORDER — IBUPROFEN 800 MG PO TABS: 800.0000 mg | ORAL_TABLET | Freq: Three times a day (TID) | ORAL | 0 refills | Status: DC | PRN

## 2016-01-02 MED ORDER — NICOTINE 14 MG/24HR TD PT24: 14.0000 mg | MEDICATED_PATCH | TRANSDERMAL | 3 refills | Status: DC

## 2016-01-02 MED ORDER — PHENYTOIN SODIUM EXTENDED 200 MG PO CAPS: ORAL_CAPSULE | ORAL | Status: DC

## 2016-01-02 MED ORDER — NICOTINE POLACRILEX 2 MG MT LOZG: 2.0000 mg | LOZENGE | OROMUCOSAL | 0 refills | Status: DC | PRN

## 2016-01-02 NOTE — Assessment & Plan Note (Signed)
Assessment Since his last office visit, he has not had recurrence of his left ankle pain. He ran out of his ibuprofen 800 mg tablets as he continued to take 1 a day thinking that it was a prophylactic medication. He did also reports some stomach pain while he was taking this medication.  Plan -Counseled the patient that it should be taken only if he is noticing the pain recur to which she expressed understanding -Refill ibuprofen 800 mg 30 tablets to be taken up to 3 times daily with meals as needed for left ankle pain

## 2016-01-02 NOTE — Assessment & Plan Note (Addendum)
Assessment His Dilantin level was 6.5 when last checked 3 months ago. He is currently taking 400 mg daily and misses his dose once a week. He has not had any seizure-like activity in the interval since he was last seen by me.  Adherence has always been an obstacle for him to achieve therapeutic levels of his antiepileptic, and we will continue trying together.  Plan -Increased Dilantin to 600 mg daily [200 mg 3 tablets] with plan to recheck in the next 2-4 weeks -Cautioned the patient that he should not continue taking this dose indefinitely to which he expressed understanding and agreement to have his lab work repeated

## 2016-01-02 NOTE — Progress Notes (Signed)
Internal Medicine Clinic Attending  Case discussed with Dr. Patel,Rushil at the time of the visit.  We reviewed the resident's history and exam and pertinent patient test results.  I agree with the assessment, diagnosis, and plan of care documented in the resident's note.  

## 2016-01-02 NOTE — Assessment & Plan Note (Signed)
Assessment He is now smoking 5 cigarettes a day and felt that the 14 g nicotine patches were of help to him. He is using the patches every other day. Though he did not tolerate the gum as monotherapy, he is open to using it as an adjunctive therapy. Barriers to cessation include being around others who smoke .  Plan -Encouraged him to continue his efforts -Refilled 14 g nicotine patches and 2 g nicotine lozenges with instructions on how to use to be provided from the pharmacist -Follow-up in 3 months

## 2016-01-02 NOTE — Patient Instructions (Addendum)
For the Dilantin, take 3 tablets daily and come back for bloodwork in 2 weeks.   For your ankle pain, take the ibuprofen only when the pain bothers you.  For your smoking, let's try the patch with the lozenge.  Unless something changes, let's see each other back in 3 months.

## 2016-01-02 NOTE — Assessment & Plan Note (Signed)
Assessment He is agreeable to receiving the flu vaccine today.  Plan -Administer flu vaccine

## 2016-01-02 NOTE — Addendum Note (Signed)
Addended by: Riccardo Dubin on: 01/02/2016 06:29 PM   Modules accepted: Orders

## 2016-02-10 ENCOUNTER — Other Ambulatory Visit: Payer: Self-pay | Admitting: Internal Medicine

## 2016-02-11 NOTE — Telephone Encounter (Signed)
Per my last office visit, he was to take 600 mg and then come in for a lab recheck. Can we confirm with him to come in for a lab visit? I will prescribe accordingly and have him return in 1 month for recheck.  Thank you.

## 2016-02-13 ENCOUNTER — Other Ambulatory Visit: Payer: Self-pay | Admitting: Internal Medicine

## 2016-02-13 ENCOUNTER — Other Ambulatory Visit: Payer: Self-pay

## 2016-02-13 ENCOUNTER — Ambulatory Visit: Payer: Self-pay

## 2016-02-13 DIAGNOSIS — R569 Unspecified convulsions: Secondary | ICD-10-CM

## 2016-02-13 NOTE — Telephone Encounter (Signed)
According to previous refill request, Dr Posey Pronto stated pt needs lab prior to Dilantin refill. I called pt - stated he will be here today @ 1330PM to have labs done.

## 2016-02-13 NOTE — Telephone Encounter (Signed)
phenytoin (DILANTIN) 200 MG ER capsule  Refill request

## 2016-02-13 NOTE — Telephone Encounter (Signed)
Thank you :)

## 2016-02-16 ENCOUNTER — Other Ambulatory Visit (INDEPENDENT_AMBULATORY_CARE_PROVIDER_SITE_OTHER): Payer: BLUE CROSS/BLUE SHIELD

## 2016-02-16 ENCOUNTER — Other Ambulatory Visit: Payer: Self-pay | Admitting: *Deleted

## 2016-02-16 DIAGNOSIS — G40909 Epilepsy, unspecified, not intractable, without status epilepticus: Secondary | ICD-10-CM | POA: Diagnosis not present

## 2016-02-16 DIAGNOSIS — R569 Unspecified convulsions: Secondary | ICD-10-CM

## 2016-02-16 MED ORDER — PHENYTOIN SODIUM EXTENDED 200 MG PO CAPS: ORAL_CAPSULE | ORAL | 0 refills | Status: DC

## 2016-02-16 NOTE — Telephone Encounter (Signed)
Pt re-schedule lab appt - will be here @ 1100 AM per [facility].

## 2016-02-16 NOTE — Telephone Encounter (Signed)
Dilantin refill completed today per Dr Lynnae January.

## 2016-02-16 NOTE — Telephone Encounter (Signed)
Dose may change based on lab results today

## 2016-02-16 NOTE — Addendum Note (Signed)
Addended by: Truddie Crumble on: 02/16/2016 10:55 AM   Modules accepted: Orders

## 2016-02-17 LAB — PHENYTOIN LEVEL, TOTAL: Phenytoin (Dilantin), Serum: <0.8 ug/mL — ABNORMAL LOW (ref 10.0–20.0)

## 2016-04-15 ENCOUNTER — Telehealth: Payer: Self-pay | Admitting: Internal Medicine

## 2016-04-15 NOTE — Telephone Encounter (Signed)
Calling to confirm appt for 04/16/16 at 3:45

## 2016-04-16 ENCOUNTER — Ambulatory Visit (INDEPENDENT_AMBULATORY_CARE_PROVIDER_SITE_OTHER): Payer: BLUE CROSS/BLUE SHIELD | Admitting: Internal Medicine

## 2016-04-16 ENCOUNTER — Encounter: Payer: Self-pay | Admitting: Internal Medicine

## 2016-04-16 VITALS — BP 119/58 | HR 69 | Temp 98.3°F | Ht 66.0 in | Wt 149.7 lb

## 2016-04-16 DIAGNOSIS — R569 Unspecified convulsions: Secondary | ICD-10-CM

## 2016-04-16 DIAGNOSIS — M79672 Pain in left foot: Secondary | ICD-10-CM

## 2016-04-16 DIAGNOSIS — E7800 Pure hypercholesterolemia, unspecified: Secondary | ICD-10-CM

## 2016-04-16 DIAGNOSIS — Z720 Tobacco use: Secondary | ICD-10-CM

## 2016-04-16 DIAGNOSIS — Z79899 Other long term (current) drug therapy: Secondary | ICD-10-CM

## 2016-04-16 DIAGNOSIS — E785 Hyperlipidemia, unspecified: Secondary | ICD-10-CM

## 2016-04-16 DIAGNOSIS — F17211 Nicotine dependence, cigarettes, in remission: Secondary | ICD-10-CM

## 2016-04-16 DIAGNOSIS — G40909 Epilepsy, unspecified, not intractable, without status epilepticus: Secondary | ICD-10-CM

## 2016-04-16 DIAGNOSIS — Z5181 Encounter for therapeutic drug level monitoring: Secondary | ICD-10-CM

## 2016-04-16 DIAGNOSIS — M25572 Pain in left ankle and joints of left foot: Secondary | ICD-10-CM

## 2016-04-16 MED ORDER — NICOTINE POLACRILEX 2 MG MT LOZG: 2.0000 mg | LOZENGE | OROMUCOSAL | 3 refills | Status: DC | PRN

## 2016-04-16 MED ORDER — PHENYTOIN SODIUM EXTENDED 200 MG PO CAPS: 400.0000 mg | ORAL_CAPSULE | Freq: Every day | ORAL | 0 refills | Status: DC

## 2016-04-16 NOTE — Progress Notes (Signed)
   CC: left foot pain  HPI:  [name] is a 56 y.o. male who presents today for left foot pain. Please see assessment & plan for status of chronic medical problems.    Past Medical History:  Diagnosis Date  . Alcohol abuse    Hx of , stopped in 2000. Had 12 pack for 25 years.   . Arthritis    S/P RIGHT TOTAL HIP ARTHROPLASTY 2007 --PT HAS NOTICED PAIN RIGHT LEG--IS TOLD HE NEEDS RT HIP REVISION  . Avascular necrosis of bone of hip ([place]) 2007   Right  . Chronic chest pain    Normal stress test in 2003  . History of tobacco abuse   . Pleural plaque    MULTIPLE BILATERAL PLEURAL PLAQUES, which had not changed on CT as of 1/04  . Seizure disorder ([place])    Started at age 57. Last EMR record 05/25/09  . Seizures ([name])    PT THINKS GRAND MAL  - LAST TIME PT THINKS WAS 2011    Review of Systems:  Please see each problem below for a pertinent review of systems.  Physical Exam:  Vitals:   04/16/16 1605  BP: (!) 119/58  Pulse: 69  Temp: 98.3 F (36.8 C)  TempSrc: Oral  SpO2: 98%  Weight: 149 lb 11.2 oz (67.9 kg)  Height: 5\' 6"  (1.676 m)   Physical Exam  Constitutional: No distress.  HENT:  Head: Normocephalic and atraumatic.  Eyes: Conjunctivae are normal. No scleral icterus.  Cardiovascular: Normal rate and regular rhythm.   Pulmonary/Chest: Effort normal. No respiratory distress.  Musculoskeletal:  Clubbing of fingers bilaterally. Left foot without any gross deformities. No tenderness to palpation of the dorsum or plantar aspect. No pain elicited with percussion of the tarsal tunnel. Left foot with range of motion intact.  Skin: He is not diaphoretic.    Assessment & Plan:   See Encounters Tab for problem based charting.  Patient discussed with Dr. Beryle Beams

## 2016-04-17 ENCOUNTER — Encounter: Payer: Self-pay | Admitting: Internal Medicine

## 2016-04-17 LAB — PHENYTOIN LEVEL, TOTAL: Phenytoin (Dilantin), Serum: 18.2 ug/mL (ref 10.0–20.0)

## 2016-04-17 NOTE — Assessment & Plan Note (Signed)
Assessment Unfortunately, he continues to experience intermittent left foot pain, most recently a few days ago when he got out of bed in the morning. His pain did not improve with ibuprofen 800 mg. My physical exam findings are not different from last time, and I would like to refer him to podiatry for further management as I am unsure about the etiology of his symptoms.  Plan -Refer to podiatry

## 2016-04-17 NOTE — Assessment & Plan Note (Signed)
Assessment Since 03/08/16, he has quit smoking altogether. He has been tempted only once but finds the lozenges very helpful to curbing his desire.   Plan -Commended him on his effort -Prescribed nicotine lozenges

## 2016-04-17 NOTE — Assessment & Plan Note (Addendum)
Assessment His last phenytoin level was undetectable, and he was prescribed phenytoin ER 600 mg/day. Unfortunately, he felt dizzy and unwell on this dose, so he decreased to 400mg /day. He has not had any seizures since 2013 despite being subtherapeutic over the time I have gotten to know him.  At this point, I am inclined to ensure he has some medication on board and will avoid adjusting his dose unless there is a risk of toxicity. His major risk factor are the video games he enjoys playing, most recently [employer].  Plan -Continue phenytoin ER 400mg /day and recheck total level today as albumin has been within normal limits per review of the chart  ADDENDUM 04/17/2016  5:47 PM:  Dilantin level is 18.2, just shy of the upper limit of normal. I will recommend he cut back to 200 mg as he is now on the ER formulation. Please see subsequent phone note for details.

## 2016-04-17 NOTE — Assessment & Plan Note (Signed)
Assessment He is no longer taking atorvastatin. In May 2017, his 10-year risk was 9.2 % and 4.9 % with optimal risk factors, but if I used his most recent values from June 2017, these values decrease to 5.6% and 4.8% respectively, when he consider his tobacco cessation.  Plan -Defer resuming statin therapy for now given that he modified a significant risk factor though may consider resuming he starts smoking again

## 2016-04-19 ENCOUNTER — Telehealth: Payer: Self-pay | Admitting: Internal Medicine

## 2016-04-19 DIAGNOSIS — R569 Unspecified convulsions: Secondary | ICD-10-CM

## 2016-04-19 MED ORDER — PHENYTOIN SODIUM EXTENDED 200 MG PO CAPS: 200.0000 mg | ORAL_CAPSULE | Freq: Every day | ORAL | 0 refills | Status: DC

## 2016-04-19 NOTE — Addendum Note (Signed)
Addended by: Riccardo Dubin on: 04/19/2016 06:38 PM   Modules accepted: Orders

## 2016-04-19 NOTE — Telephone Encounter (Signed)
I was able to reach him and confirmed the dose reduction by teach back. I also educated him on the symptoms of phenytoin toxicity which he appreciated.

## 2016-04-19 NOTE — Telephone Encounter (Signed)
Please call the patient and let him know to decrease his phenytoin from 2 capsules to 1 capsule/day. He is within therapeutic range for the medication, and we don't want him to have too much on board.   If he experiences worsening dizziness, he should come in for a repeat blood level as it might be a symptom of too much medication.   I will try to reach him later today in the afternoon if we cannot reach him this morning.  Thank you.

## 2016-04-19 NOTE — Telephone Encounter (Signed)
Lm for rtc 

## 2016-04-19 NOTE — Progress Notes (Signed)
Medicine attending: Medical history, presenting problems, physical findings, and medications, reviewed with resident physician Dr Rushil Patel on the day of the patient visit and I concur with his evaluation and management plan. 

## 2016-04-28 ENCOUNTER — Encounter: Payer: Self-pay | Admitting: *Deleted

## 2016-05-05 ENCOUNTER — Ambulatory Visit: Payer: Self-pay | Admitting: Podiatry

## 2016-05-16 ENCOUNTER — Other Ambulatory Visit: Payer: Self-pay | Admitting: Internal Medicine

## 2016-05-16 DIAGNOSIS — R569 Unspecified convulsions: Secondary | ICD-10-CM

## 2016-05-17 NOTE — Telephone Encounter (Signed)
As this patient was seen by me and deemed necessary for their treatment, I will refill this prescription for phenytoin. I decreased his dose per his last serum values as he was the upper limit of normal for his serum phenytoin level.

## 2016-05-19 ENCOUNTER — Ambulatory Visit (INDEPENDENT_AMBULATORY_CARE_PROVIDER_SITE_OTHER): Payer: BLUE CROSS/BLUE SHIELD | Admitting: Podiatry

## 2016-05-19 ENCOUNTER — Ambulatory Visit (INDEPENDENT_AMBULATORY_CARE_PROVIDER_SITE_OTHER): Payer: BLUE CROSS/BLUE SHIELD

## 2016-05-19 DIAGNOSIS — M25572 Pain in left ankle and joints of left foot: Secondary | ICD-10-CM

## 2016-05-19 DIAGNOSIS — M7552 Bursitis of left shoulder: Secondary | ICD-10-CM

## 2016-05-19 DIAGNOSIS — R600 Localized edema: Secondary | ICD-10-CM

## 2016-05-19 MED ORDER — MELOXICAM 15 MG PO TABS: 15.0000 mg | ORAL_TABLET | Freq: Every day | ORAL | 1 refills | Status: AC

## 2016-05-19 MED ORDER — BETAMETHASONE SOD PHOS & ACET 6 (3-3) MG/ML IJ SUSP: 3.0000 mg | Freq: Once | INTRAMUSCULAR | Status: DC

## 2016-05-19 NOTE — Progress Notes (Signed)
   Subjective:  Patient presents today as a new patient for evaluation of left ankle pain and burning. This is been going on for approximately 2 months. Patient denies any trauma. 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    Objective/Physical Exam General: The patient is alert and oriented x3 in no acute distress.  Dermatology: Skin is warm, dry and supple bilateral lower extremities. Negative for open lesions or macerations.  Vascular: Palpable pedal pulses bilaterally. No edema or erythema noted. Capillary refill within normal limits.  Neurological: Epicritic and protective threshold grossly intact bilaterally.   Musculoskeletal Exam: Severe pain on palpation noted to the medial aspect of the patient's left ankle joint. Moderate pain on palpation noted to the lateral aspect of the left ankle joint.  Radiographic Exam:  Normal osseous mineralization. Joint spaces preserved. No fracture/dislocation/boney destruction.    Assessment: #1 possible deltoid ligament strain  #2 left ankle pain with capsulitis and edema   Plan of Care:  #1 Patient was evaluated. X-rays were reviewed #2 injection of 0.5 mL Celestone Soluspan injected into the left ankle joint #3 prescription for meloxicam 15 mg #4 compression anklet #5 immobilization cam boot provided. Patient we weightbearing in the cam boot however he needs to wear it all day long. #6 return to clinic in 4 weeks  The patient is not better we will likely require an MRI.  Patient is a Press photographer at [employer].    Edrick Kins, DPM [facility]  Dr. Edrick Kins, [address]                                        [place], [place] [address]                Office [phone]  Fax [phone]

## 2016-06-02 ENCOUNTER — Ambulatory Visit (INDEPENDENT_AMBULATORY_CARE_PROVIDER_SITE_OTHER): Payer: BLUE CROSS/BLUE SHIELD | Admitting: Podiatry

## 2016-06-02 ENCOUNTER — Encounter: Payer: Self-pay | Admitting: Podiatry

## 2016-06-02 DIAGNOSIS — M7752 Other enthesopathy of left foot: Secondary | ICD-10-CM | POA: Diagnosis not present

## 2016-06-02 DIAGNOSIS — M25572 Pain in left ankle and joints of left foot: Secondary | ICD-10-CM

## 2016-06-02 DIAGNOSIS — M659 Synovitis and tenosynovitis, unspecified: Secondary | ICD-10-CM

## 2016-06-02 NOTE — Progress Notes (Signed)
   Subjective:  Patient presetion of a possible deltoid ligament tear and generalized left ankle pain. Patient states that he is not improving. He still continues to have significant amounts of pain every day. Patient has been using the cam boot for immobilization and taking the oral anti-inflammatory. Patient states the injection did not help. 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    Objective/Physical Exam General: The patient is alert and oriented x3 in no acute distress.  Dermatology: Skin is warm, dry and supple bilateral lower extremities. Negative for open lesions or macerations.  Vascular: Palpable pedal pulses bilaterally. No edema or erythema noted. Capillary refill within normal limits.  Neurological: Epicritic and protective threshold grossly intact bilaterally.   Musculoskeletal Exam: Severe pain on palpation noted to the medial aspect of the patient's left ankle joint. Moderate pain on palpation noted to the lateral aspect of the left ankle joint.  Radiographic Exam:  Normal osseous mineralization. Joint spaces preserved. No fracture/dislocation/boney destruction.    Assessment: #1 possible deltoid ligament strain  #2 left ankle pain with capsulitis and edema   Plan of Care:  #1 Patient was evaluated.  #2 today since there is no improvement of symptoms the patient has had this ankle pain for greater than 3 months now were going to order an MRI left ankle. Conservative modalities including anti-inflammatory injection, oral anti-inflammatory, immobilization have not alleviated symptoms. #3 return to clinic in 3 weeks to review MRI results  Patient is a Press photographer at [employer].    Edrick Kins, DPM [facility]  Dr. Edrick Kins, [address]                                        [place], [place] [address]                Office [phone]  Fax [phone]

## 2016-06-03 ENCOUNTER — Telehealth: Payer: Self-pay | Admitting: *Deleted

## 2016-06-03 DIAGNOSIS — T148XXA Other injury of unspecified body region, initial encounter: Secondary | ICD-10-CM

## 2016-06-03 NOTE — Telephone Encounter (Addendum)
-----   Message from Edrick Kins, DPM sent at 06/02/2016  8:35 AM EDT ----- Regarding: MRI left ankle Please order MRI left ankle w/ or w/out contrast. Progress note done.  Dx : possible deltoid ligament tear. unresolving left ankle pain.   Thanks, Dr. Amalia Hailey.06/02/2016-Gave orders to Gretta Arab, RN for pre-cert and faxed to [facility]. 06/23/2016-Faxed orders to [facility] again. [facility] sent request for refill of Vicodin 5/300mg . Dr. Amalia Hailey gave pt rx for the Vicodin 5/300mg .

## 2016-06-23 ENCOUNTER — Ambulatory Visit (INDEPENDENT_AMBULATORY_CARE_PROVIDER_SITE_OTHER): Payer: BLUE CROSS/BLUE SHIELD | Admitting: Podiatry

## 2016-06-23 ENCOUNTER — Encounter: Payer: Self-pay | Admitting: Podiatry

## 2016-06-23 DIAGNOSIS — M25572 Pain in left ankle and joints of left foot: Secondary | ICD-10-CM

## 2016-06-23 DIAGNOSIS — M659 Synovitis and tenosynovitis, unspecified: Secondary | ICD-10-CM

## 2016-06-23 DIAGNOSIS — M7752 Other enthesopathy of left foot: Secondary | ICD-10-CM | POA: Diagnosis not present

## 2016-06-23 MED ORDER — BETAMETHASONE SOD PHOS & ACET 6 (3-3) MG/ML IJ SUSP: 3.0000 mg | Freq: Once | INTRAMUSCULAR | Status: DC

## 2016-06-23 MED ORDER — HYDROCODONE-ACETAMINOPHEN 5-300 MG PO TABS: 1.0000 | ORAL_TABLET | Freq: Three times a day (TID) | ORAL | 0 refills | Status: DC | PRN

## 2016-06-23 NOTE — Telephone Encounter (Signed)
-----   Message from Edrick Kins, DPM sent at 06/23/2016  8:52 AM EDT ----- Regarding: MRI left ankle Please order MRI w/ or w/out contrast left ankle.  Dx: possible PT tendon tear. Possible deltoid ligament tear. Chronic ankle synovitis and pain.   Thanks, Dr. Amalia Hailey

## 2016-06-23 NOTE — Progress Notes (Signed)
   Subjective:  Patient presetion of a possible deltoid ligament tear and generalized left ankle pain. Patient states that he is not improving. He still continues to have significant amounts of pain every day. Patient has been using the cam boot for immobilization and taking the oral anti-inflammatory. Patient states the injection did not help. 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    Objective/Physical Exam General: The patient is alert and oriented x3 in no acute distress.  Dermatology: Skin is warm, dry and supple bilateral lower extremities. Negative for open lesions or macerations.  Vascular: Palpable pedal pulses bilaterally. No edema or erythema noted. Capillary refill within normal limits.  Neurological: Epicritic and protective threshold grossly intact bilaterally.   Musculoskeletal Exam: Severe pain on palpation noted to the medial aspect of the patient's left ankle joint. Moderate pain on palpation noted to the lateral aspect of the left ankle joint.  Radiographic Exam:  Normal osseous mineralization. Joint spaces preserved. No fracture/dislocation/boney destruction.    Assessment: #1 possible deltoid ligament strain  #2 left ankle pain with capsulitis and edema with synovitis   Plan of Care:  #1 Patient was evaluated.  #2 today were in follow-up of the MRI of the left ankle. Patient has had this chronic ankle pain for greater than 3 months without any alleviation of symptoms. Conservative modalities including anti-inflammatory injection, oral anti-inflammatory, immobilization have not alleviated symptoms. #3 injection of 0.5 mL Celestone Soluspan injected into the left ankle joint  #4 prescription for hydrocodone  5/325mg  #5 return to clinic in 3 weeks to review MRI results  Patient is a Press photographer at [employer].    Edrick Kins, DPM [facility]  Dr. Edrick Kins, [address]                                         [place], [place] [address]                Office [phone]  Fax [phone]

## 2016-07-06 ENCOUNTER — Ambulatory Visit: Payer: BLUE CROSS/BLUE SHIELD | Admitting: Podiatry

## 2016-07-06 ENCOUNTER — Ambulatory Visit
Admission: RE | Admit: 2016-07-06 | Discharge: 2016-07-06 | Disposition: A | Discharge status: Home / Self Care | Payer: BLUE CROSS/BLUE SHIELD | Source: Ambulatory Visit | Attending: Podiatry | Admitting: Podiatry

## 2016-07-06 DIAGNOSIS — M25572 Pain in left ankle and joints of left foot: Secondary | ICD-10-CM | POA: Diagnosis not present

## 2016-07-14 ENCOUNTER — Ambulatory Visit (INDEPENDENT_AMBULATORY_CARE_PROVIDER_SITE_OTHER): Payer: BLUE CROSS/BLUE SHIELD | Admitting: Podiatry

## 2016-07-14 ENCOUNTER — Encounter: Payer: Self-pay | Admitting: Podiatry

## 2016-07-14 DIAGNOSIS — M6790 Unspecified disorder of synovium and tendon, unspecified site: Secondary | ICD-10-CM | POA: Diagnosis not present

## 2016-07-14 DIAGNOSIS — M76822 Posterior tibial tendinitis, left leg: Secondary | ICD-10-CM

## 2016-07-14 DIAGNOSIS — M6780 Other specified disorders of synovium and tendon, unspecified site: Secondary | ICD-10-CM

## 2016-07-14 NOTE — Patient Instructions (Signed)

## 2016-07-16 NOTE — Progress Notes (Signed)
   Subjective:  Patient presents today for follow up evaluation of a possible deltoid ligament tear and generalized left ankle pain. Patient states that his pain is the same. He states the injection gave him some mild relief. He is here for further evaluation and treatment.    Objective/Physical Exam General: The patient is alert and oriented x3 in no acute distress.  Dermatology: Skin is warm, dry and supple bilateral lower extremities. Negative for open lesions or macerations.  Vascular: Palpable pedal pulses bilaterally. No edema or erythema noted. Capillary refill within normal limits.  Neurological: Epicritic and protective threshold grossly intact bilaterally.   Musculoskeletal Exam: Severe pain on palpation noted to the medial aspect of the patient's left ankle joint. Moderate pain on palpation noted to the lateral aspect of the left ankle joint.  MRI Exam:  1. Bone contusion involving the medial malleolus. 2. Tenosynovitis of the medial tendons crossing the ankle joint more so involving the tibialis posterior flexor hallucis longus.  3. The deltoid ligament appears grossly intact.     Assessment: #1 PT tendinitis with degenerative changes left lower extremity   Plan of Care:  #1 Patient was evaluated. MRI results reviewed. #2 Today we discussed the conservative versus surgical management of posterior tibial tendinitis/tendinosis pathology. The patient opts for surgical management. All possible complications and details of the procedure were explained. All patient questions were answered. No guarantees were expressed or implied. #3 Authorization for surgery was initiated today. Surgery will consist of tenosynovectomy with repair of posterior tibial tendon-left. #4 Continue wearing CAM boot #5 Return to clinic one week postop    Edrick Kins, DPM [facility]  Dr. Edrick Kins, [address]                                          [place], [place] [address]                Office [phone]  Fax [phone]

## 2016-08-05 ENCOUNTER — Encounter: Payer: Self-pay | Admitting: Podiatry

## 2016-08-05 DIAGNOSIS — M66372 Spontaneous rupture of flexor tendons, left ankle and foot: Secondary | ICD-10-CM | POA: Diagnosis not present

## 2016-08-05 DIAGNOSIS — M65872 Other synovitis and tenosynovitis, left ankle and foot: Secondary | ICD-10-CM | POA: Diagnosis not present

## 2016-08-05 DIAGNOSIS — M76822 Posterior tibial tendinitis, left leg: Secondary | ICD-10-CM | POA: Diagnosis not present

## 2016-08-05 DIAGNOSIS — S86192D Other injury of other muscle(s) and tendon(s) of posterior muscle group at lower leg level, left leg, subsequent encounter: Secondary | ICD-10-CM | POA: Diagnosis not present

## 2016-08-09 ENCOUNTER — Encounter: Payer: Self-pay | Admitting: *Deleted

## 2016-08-09 NOTE — Progress Notes (Signed)
DOS 05.31.2018 Tenosynovectomy with repair of posterior tibial tendon left lower extremity.

## 2016-08-11 ENCOUNTER — Encounter: Payer: Self-pay | Admitting: Podiatry

## 2016-08-11 ENCOUNTER — Ambulatory Visit (INDEPENDENT_AMBULATORY_CARE_PROVIDER_SITE_OTHER): Payer: BLUE CROSS/BLUE SHIELD | Admitting: Podiatry

## 2016-08-11 ENCOUNTER — Ambulatory Visit (INDEPENDENT_AMBULATORY_CARE_PROVIDER_SITE_OTHER): Payer: BLUE CROSS/BLUE SHIELD

## 2016-08-11 DIAGNOSIS — Z9889 Other specified postprocedural states: Secondary | ICD-10-CM

## 2016-08-14 NOTE — Progress Notes (Signed)
   Subjective:  Patient presents today status post PT tendon repair of the left lower extremity. DOS: 08/05/16. He states he is doing well and has no complaints at this time.    Objective/Physical Exam Skin incisions appear to be well coapted with sutures and staples intact. No sign of infectious process noted. No dehiscence. No active bleeding noted. Moderate edema noted to the surgical extremity.  Radiographic Exam:  Orthopedic hardware and osteotomies sites appear to be stable with routine healing.  Assessment: 1. s/p PT tendon repair surgery left lower extremity. DOS: 08/05/16.   Plan of Care:  1. Patient was evaluated. X-rays reviewed 2. Dressing changed. 3. Continue nonweightbearing in cam boot. 4. Return to clinic in 1 week for dressing change with the nurse.   Edrick Kins, DPM [facility]  Dr. Edrick Kins, [address]                                        [place], [place] [address]                Office [phone]  Fax [phone]

## 2016-08-30 ENCOUNTER — Ambulatory Visit (INDEPENDENT_AMBULATORY_CARE_PROVIDER_SITE_OTHER): Payer: BLUE CROSS/BLUE SHIELD | Admitting: Podiatry

## 2016-08-30 ENCOUNTER — Telehealth: Payer: Self-pay | Admitting: *Deleted

## 2016-08-30 DIAGNOSIS — M76822 Posterior tibial tendinitis, left leg: Secondary | ICD-10-CM

## 2016-08-30 DIAGNOSIS — Z9889 Other specified postprocedural states: Secondary | ICD-10-CM

## 2016-08-30 NOTE — Telephone Encounter (Addendum)
-----   Message from Edrick Kins, DPM sent at 08/30/2016 11:24 AM EDT ----- Regarding: Physical therapy order Please order physical therapy 3x/week x 4 weeks.   Dx: s/p PT tendon repair left. DOS: 08/05/16  Thanks, Dr. Amalia Hailey. Faxed orders to [facility].

## 2016-09-01 NOTE — Progress Notes (Signed)
   Subjective:  Patient presents today status post PT tendon repair of the left lower extremity. DOS: 08/05/16. He states he is improving and has no complaints at this time.    Objective/Physical Exam Skin incisions appear to be well coapted with sutures and staples intact. No sign of infectious process noted. No dehiscence. No active bleeding noted. Moderate edema noted to the surgical extremity.   Assessment: 1. s/p PT tendon repair surgery left lower extremity. DOS: 08/05/16.   Plan of Care:  1. Patient was evaluated. 2. Staples were removed. 3. Orders for physical therapy 3 times a week for 4 weeks. 4. Return to clinic in 4 weeks.   Edrick Kins, DPM [facility]  Dr. Edrick Kins, [address]                                        [place], [place] [address]                Office [phone]  Fax [phone]

## 2016-09-03 ENCOUNTER — Telehealth: Payer: Self-pay | Admitting: Podiatry

## 2016-09-03 NOTE — Telephone Encounter (Signed)
I saw Dr. Amalia Hailey on Monday and he said something about starting me on Physical Therapy. I have not heard anything and would like to know what is going on.

## 2016-09-03 NOTE — Telephone Encounter (Signed)
I informed pt the referral to [facility] had been faxed 08/30/2016, and that he could contact [phone] for appts.

## 2016-09-10 DIAGNOSIS — M25472 Effusion, left ankle: Secondary | ICD-10-CM | POA: Diagnosis not present

## 2016-09-10 DIAGNOSIS — M25672 Stiffness of left ankle, not elsewhere classified: Secondary | ICD-10-CM | POA: Diagnosis not present

## 2016-09-10 DIAGNOSIS — M25572 Pain in left ankle and joints of left foot: Secondary | ICD-10-CM | POA: Diagnosis not present

## 2016-09-10 DIAGNOSIS — R269 Unspecified abnormalities of gait and mobility: Secondary | ICD-10-CM | POA: Diagnosis not present

## 2016-09-13 DIAGNOSIS — M25472 Effusion, left ankle: Secondary | ICD-10-CM | POA: Diagnosis not present

## 2016-09-13 DIAGNOSIS — M25672 Stiffness of left ankle, not elsewhere classified: Secondary | ICD-10-CM | POA: Diagnosis not present

## 2016-09-13 DIAGNOSIS — R269 Unspecified abnormalities of gait and mobility: Secondary | ICD-10-CM | POA: Diagnosis not present

## 2016-09-13 DIAGNOSIS — M25572 Pain in left ankle and joints of left foot: Secondary | ICD-10-CM | POA: Diagnosis not present

## 2016-09-15 DIAGNOSIS — M25672 Stiffness of left ankle, not elsewhere classified: Secondary | ICD-10-CM | POA: Diagnosis not present

## 2016-09-15 DIAGNOSIS — R269 Unspecified abnormalities of gait and mobility: Secondary | ICD-10-CM | POA: Diagnosis not present

## 2016-09-15 DIAGNOSIS — M25472 Effusion, left ankle: Secondary | ICD-10-CM | POA: Diagnosis not present

## 2016-09-15 DIAGNOSIS — M25572 Pain in left ankle and joints of left foot: Secondary | ICD-10-CM | POA: Diagnosis not present

## 2016-09-20 DIAGNOSIS — R269 Unspecified abnormalities of gait and mobility: Secondary | ICD-10-CM | POA: Diagnosis not present

## 2016-09-20 DIAGNOSIS — M25572 Pain in left ankle and joints of left foot: Secondary | ICD-10-CM | POA: Diagnosis not present

## 2016-09-20 DIAGNOSIS — M25672 Stiffness of left ankle, not elsewhere classified: Secondary | ICD-10-CM | POA: Diagnosis not present

## 2016-09-20 DIAGNOSIS — M25472 Effusion, left ankle: Secondary | ICD-10-CM | POA: Diagnosis not present

## 2016-09-22 DIAGNOSIS — M25572 Pain in left ankle and joints of left foot: Secondary | ICD-10-CM | POA: Diagnosis not present

## 2016-09-22 DIAGNOSIS — M25472 Effusion, left ankle: Secondary | ICD-10-CM | POA: Diagnosis not present

## 2016-09-22 DIAGNOSIS — R269 Unspecified abnormalities of gait and mobility: Secondary | ICD-10-CM | POA: Diagnosis not present

## 2016-09-22 DIAGNOSIS — M25672 Stiffness of left ankle, not elsewhere classified: Secondary | ICD-10-CM | POA: Diagnosis not present

## 2016-09-27 ENCOUNTER — Encounter: Payer: Self-pay | Admitting: Podiatry

## 2016-09-27 ENCOUNTER — Ambulatory Visit (INDEPENDENT_AMBULATORY_CARE_PROVIDER_SITE_OTHER): Payer: BLUE CROSS/BLUE SHIELD | Admitting: Podiatry

## 2016-09-27 DIAGNOSIS — Z9889 Other specified postprocedural states: Secondary | ICD-10-CM

## 2016-09-27 NOTE — Progress Notes (Signed)
   Subjective:  Patient presents today status post PT tendon repair of the left lower extremity. DOS: 08/05/16. He says that there is since he started physical therapy note he has noticed some pain and tenderness to the lateral aspect of the patient's left ankle. Patient denies pain on the medial aspect of the ankle along the posterior tibial tendon surgical site.   Objective/Physical Exam Skin incisions appear to be well coapted. No sign of infectious process noted. No dehiscence. Minimal edema noted to the left lower extremity. There is negative pain on palpation noted to the posterior tibial tendon. Minimal to moderate pain on palpation noted to the lateral gutter of the ankle..   Assessment: 1. s/p PT tendon repair surgery left lower extremity. DOS: 08/05/16.   Plan of Care:  1. Patient was evaluated. 2. Continue physical therapy until finished. The patient has approximately two more weeks of physical therapy 3. Today the patient can discontinue wearing the cam boot. Begin slowly transition into good supportive tennis shoes 4. Return to work in 3 weeks full duty restrictions  Edrick Kins, DPM [facility]  Dr. Edrick Kins, [address]                                        [place], [place] [address]                Office [phone]  Fax [phone]

## 2016-09-28 DIAGNOSIS — M25472 Effusion, left ankle: Secondary | ICD-10-CM | POA: Diagnosis not present

## 2016-09-28 DIAGNOSIS — R269 Unspecified abnormalities of gait and mobility: Secondary | ICD-10-CM | POA: Diagnosis not present

## 2016-09-28 DIAGNOSIS — M25572 Pain in left ankle and joints of left foot: Secondary | ICD-10-CM | POA: Diagnosis not present

## 2016-09-28 DIAGNOSIS — M25672 Stiffness of left ankle, not elsewhere classified: Secondary | ICD-10-CM | POA: Diagnosis not present

## 2016-09-30 DIAGNOSIS — M25672 Stiffness of left ankle, not elsewhere classified: Secondary | ICD-10-CM | POA: Diagnosis not present

## 2016-09-30 DIAGNOSIS — M25472 Effusion, left ankle: Secondary | ICD-10-CM | POA: Diagnosis not present

## 2016-09-30 DIAGNOSIS — R269 Unspecified abnormalities of gait and mobility: Secondary | ICD-10-CM | POA: Diagnosis not present

## 2016-09-30 DIAGNOSIS — M25572 Pain in left ankle and joints of left foot: Secondary | ICD-10-CM | POA: Diagnosis not present

## 2016-10-05 DIAGNOSIS — M25672 Stiffness of left ankle, not elsewhere classified: Secondary | ICD-10-CM | POA: Diagnosis not present

## 2016-10-05 DIAGNOSIS — M25472 Effusion, left ankle: Secondary | ICD-10-CM | POA: Diagnosis not present

## 2016-10-05 DIAGNOSIS — M25572 Pain in left ankle and joints of left foot: Secondary | ICD-10-CM | POA: Diagnosis not present

## 2016-10-05 DIAGNOSIS — R269 Unspecified abnormalities of gait and mobility: Secondary | ICD-10-CM | POA: Diagnosis not present

## 2016-10-07 DIAGNOSIS — M25472 Effusion, left ankle: Secondary | ICD-10-CM | POA: Diagnosis not present

## 2016-10-07 DIAGNOSIS — M25672 Stiffness of left ankle, not elsewhere classified: Secondary | ICD-10-CM | POA: Diagnosis not present

## 2016-10-07 DIAGNOSIS — M25572 Pain in left ankle and joints of left foot: Secondary | ICD-10-CM | POA: Diagnosis not present

## 2016-10-07 DIAGNOSIS — R269 Unspecified abnormalities of gait and mobility: Secondary | ICD-10-CM | POA: Diagnosis not present

## 2016-10-12 DIAGNOSIS — M25472 Effusion, left ankle: Secondary | ICD-10-CM | POA: Diagnosis not present

## 2016-10-12 DIAGNOSIS — M25672 Stiffness of left ankle, not elsewhere classified: Secondary | ICD-10-CM | POA: Diagnosis not present

## 2016-10-12 DIAGNOSIS — M25572 Pain in left ankle and joints of left foot: Secondary | ICD-10-CM | POA: Diagnosis not present

## 2016-10-12 DIAGNOSIS — R269 Unspecified abnormalities of gait and mobility: Secondary | ICD-10-CM | POA: Diagnosis not present

## 2016-10-19 DIAGNOSIS — R269 Unspecified abnormalities of gait and mobility: Secondary | ICD-10-CM | POA: Diagnosis not present

## 2016-10-19 DIAGNOSIS — M25472 Effusion, left ankle: Secondary | ICD-10-CM | POA: Diagnosis not present

## 2016-10-19 DIAGNOSIS — M25672 Stiffness of left ankle, not elsewhere classified: Secondary | ICD-10-CM | POA: Diagnosis not present

## 2016-10-19 DIAGNOSIS — M25572 Pain in left ankle and joints of left foot: Secondary | ICD-10-CM | POA: Diagnosis not present

## 2016-10-20 ENCOUNTER — Ambulatory Visit (INDEPENDENT_AMBULATORY_CARE_PROVIDER_SITE_OTHER): Payer: BLUE CROSS/BLUE SHIELD

## 2016-10-20 ENCOUNTER — Ambulatory Visit (INDEPENDENT_AMBULATORY_CARE_PROVIDER_SITE_OTHER): Payer: BLUE CROSS/BLUE SHIELD | Admitting: Podiatry

## 2016-10-20 DIAGNOSIS — M79672 Pain in left foot: Secondary | ICD-10-CM | POA: Diagnosis not present

## 2016-10-20 DIAGNOSIS — Z9889 Other specified postprocedural states: Secondary | ICD-10-CM | POA: Diagnosis not present

## 2016-10-20 DIAGNOSIS — M659 Synovitis and tenosynovitis, unspecified: Secondary | ICD-10-CM

## 2016-10-20 DIAGNOSIS — IMO0001 Reserved for inherently not codable concepts without codable children: Secondary | ICD-10-CM

## 2016-10-20 DIAGNOSIS — S93402A Sprain of unspecified ligament of left ankle, initial encounter: Secondary | ICD-10-CM | POA: Diagnosis not present

## 2016-10-21 DIAGNOSIS — M25572 Pain in left ankle and joints of left foot: Secondary | ICD-10-CM | POA: Diagnosis not present

## 2016-10-21 DIAGNOSIS — M25472 Effusion, left ankle: Secondary | ICD-10-CM | POA: Diagnosis not present

## 2016-10-21 DIAGNOSIS — M25672 Stiffness of left ankle, not elsewhere classified: Secondary | ICD-10-CM | POA: Diagnosis not present

## 2016-10-21 DIAGNOSIS — R269 Unspecified abnormalities of gait and mobility: Secondary | ICD-10-CM | POA: Diagnosis not present

## 2016-10-21 MED ORDER — BETAMETHASONE SOD PHOS & ACET 6 (3-3) MG/ML IJ SUSP: 3.0000 mg | Freq: Once | INTRAMUSCULAR | Status: DC

## 2016-10-21 NOTE — Progress Notes (Signed)
   Subjective:  Patient presents today status post PT tendon repair of the left lower extremity. DOS: 08/05/16. Patient presents with a new complaint today and states that he began to experience some arch pain in the left foot as well as to the lateral aspect of the ankle. Patient states that the surgical site is doing well without any pain. Patient states that he's experienced a lot of pain over the past week regarding the lateral aspect of the left ankle. Patient rates his pain with putting a lot of pressure on the foot. He denies any recent trauma. He is currently attending physical therapy and rehabilitation for his surgical posterior tibial tendon repair   Objective/Physical Exam Skin incisions appear to be well coapted. Minimal edema noted to the left lower extremity. There is negative pain on palpation noted to the posterior tibial tendon. Minimal pain on palpation noted to the lateral gutter of the ankle. Significant amount of pain on palpation noted to the lateral aspect of the patient's left ankle joint, possible ATFL sprain. There is some minimal edema noted localized the lateral aspect of the ankle.   Assessment: 1. s/p PT tendon repair surgery left lower extremity. DOS: 08/05/16. 2. Ankle synovitis left-possible ATFL sprain  Plan of Care:  1. Patient was evaluated. 2. Continue physical therapy until finished.  3. Resume immobilization cam boot and compression anklet. 4. Prescription for meloxicam 15 mg 5. Injection of 0.5 mL Celestone Soluspan injected in the patient's left ankle joint 6. Return to clinic in 2 weeks  Edrick Kins, DPM [facility]  Dr. Edrick Kins, [address]                                        [place], [place] [address]                Office [phone]  Fax [phone]

## 2016-10-26 DIAGNOSIS — M25572 Pain in left ankle and joints of left foot: Secondary | ICD-10-CM | POA: Diagnosis not present

## 2016-10-26 DIAGNOSIS — R269 Unspecified abnormalities of gait and mobility: Secondary | ICD-10-CM | POA: Diagnosis not present

## 2016-10-26 DIAGNOSIS — M25472 Effusion, left ankle: Secondary | ICD-10-CM | POA: Diagnosis not present

## 2016-10-26 DIAGNOSIS — M25672 Stiffness of left ankle, not elsewhere classified: Secondary | ICD-10-CM | POA: Diagnosis not present

## 2016-10-28 DIAGNOSIS — M25572 Pain in left ankle and joints of left foot: Secondary | ICD-10-CM | POA: Diagnosis not present

## 2016-10-28 DIAGNOSIS — M25472 Effusion, left ankle: Secondary | ICD-10-CM | POA: Diagnosis not present

## 2016-10-28 DIAGNOSIS — R269 Unspecified abnormalities of gait and mobility: Secondary | ICD-10-CM | POA: Diagnosis not present

## 2016-10-28 DIAGNOSIS — M25672 Stiffness of left ankle, not elsewhere classified: Secondary | ICD-10-CM | POA: Diagnosis not present

## 2016-11-03 ENCOUNTER — Encounter: Payer: Self-pay | Admitting: Podiatry

## 2016-11-03 ENCOUNTER — Ambulatory Visit (INDEPENDENT_AMBULATORY_CARE_PROVIDER_SITE_OTHER): Payer: Self-pay | Admitting: Podiatry

## 2016-11-03 ENCOUNTER — Ambulatory Visit: Payer: BLUE CROSS/BLUE SHIELD | Admitting: Podiatry

## 2016-11-03 DIAGNOSIS — Z9889 Other specified postprocedural states: Secondary | ICD-10-CM

## 2016-11-03 NOTE — Progress Notes (Signed)
   Subjective:  Patient presents today status post PT tendon repair of the left lower extremity. DOS: 08/05/16. Patient states that he is in no pain today. He presents today wearing sneakers. He denies any pain in these been able to do all activities. Patient denies any swelling as well.   Objective/Physical Exam Skin incisions appear to be well coapted. Negative for any edema noted to the left ankle. Negative for any pain on palpation to the anterior medial or posterior aspects of the ankle joint. Range of motion within normal limits. Muscle strength 5/5 all compartments   Assessment: 1. s/p PT tendon repair surgery left lower extremity. DOS: 08/05/16. 2. Ankle synovitis left-resolved  Plan of Care:  1. Patient was evaluated. 2. At this point the patient can return to work full duty no restrictions. 3. Return to work when necessary  Edrick Kins, DPM [facility]  Dr. Edrick Kins, [address]                                        [place], [place] [address]                Office [phone]  Fax [phone]

## 2016-11-22 ENCOUNTER — Encounter: Payer: BLUE CROSS/BLUE SHIELD | Admitting: Podiatry

## 2017-06-06 ENCOUNTER — Other Ambulatory Visit: Payer: Self-pay | Admitting: *Deleted

## 2017-06-06 DIAGNOSIS — R569 Unspecified convulsions: Secondary | ICD-10-CM

## 2017-06-06 NOTE — Telephone Encounter (Signed)
Please tell the patient to come into clinic as he has not been seen in over a year. The patient's phenytoin level had to be adjusted previously and may need to be done again.   -Please schedule patient in acc or in continuity this week to assess whether there is need for phenytoin dose change and for annual visit. Thank you!

## 2017-06-07 NOTE — Telephone Encounter (Addendum)
Patient has appt in [facility] on 06/15/2017 at 0945. Hubbard Hartshorn, RN, BSN

## 2017-06-15 ENCOUNTER — Other Ambulatory Visit: Payer: Self-pay

## 2017-06-15 ENCOUNTER — Ambulatory Visit: Payer: BLUE CROSS/BLUE SHIELD | Admitting: Internal Medicine

## 2017-06-15 VITALS — BP 125/70 | HR 61 | Temp 97.8°F | Ht 66.0 in | Wt 138.7 lb

## 2017-06-15 DIAGNOSIS — Z79899 Other long term (current) drug therapy: Secondary | ICD-10-CM

## 2017-06-15 DIAGNOSIS — F17210 Nicotine dependence, cigarettes, uncomplicated: Secondary | ICD-10-CM

## 2017-06-15 DIAGNOSIS — J309 Allergic rhinitis, unspecified: Secondary | ICD-10-CM | POA: Insufficient documentation

## 2017-06-15 DIAGNOSIS — G40909 Epilepsy, unspecified, not intractable, without status epilepticus: Secondary | ICD-10-CM | POA: Diagnosis not present

## 2017-06-15 DIAGNOSIS — R569 Unspecified convulsions: Secondary | ICD-10-CM

## 2017-06-15 DIAGNOSIS — Z720 Tobacco use: Secondary | ICD-10-CM

## 2017-06-15 MED ORDER — NICOTINE 14 MG/24HR TD PT24: 14.0000 mg | MEDICATED_PATCH | TRANSDERMAL | 0 refills | Status: DC

## 2017-06-15 MED ORDER — FLUTICASONE PROPIONATE 50 MCG/ACT NA SUSP: 2.0000 | Freq: Every day | NASAL | 2 refills | Status: DC

## 2017-06-15 MED ORDER — LORATADINE 10 MG PO TABS: 10.0000 mg | ORAL_TABLET | Freq: Every day | ORAL | 2 refills | Status: DC

## 2017-06-15 MED ORDER — NICOTINE 7 MG/24HR TD PT24: 7.0000 mg | MEDICATED_PATCH | TRANSDERMAL | 0 refills | Status: DC

## 2017-06-15 NOTE — Progress Notes (Signed)
   CC: Seizure follow up  HPI:  Mr.Jp S Coryell is a 57 y.o. male with past medical history outlined below here for seizure follow up. For the details of today's visit, please refer to the assessment and plan.  Past Medical History:  Diagnosis Date  . Alcohol abuse    Hx of , stopped in 2000. Had 12 pack for 25 years.   . Arthritis    S/P RIGHT TOTAL HIP ARTHROPLASTY 2007 --PT HAS NOTICED PAIN RIGHT LEG--IS TOLD HE NEEDS RT HIP REVISION  . Avascular necrosis of bone of hip ([place]) 2007   Right  . Chronic chest pain    Normal stress test in 2003  . History of tobacco abuse   . Pleural plaque    MULTIPLE BILATERAL PLEURAL PLAQUES, which had not changed on CT as of 1/04  . Seizure disorder ([place])    Started at age 8. Last EMR record 05/25/09  . Seizures ([place])    PT THINKS GRAND MAL  - LAST TIME PT THINKS WAS 2011    Review of Systems  Neurological: Negative for seizures and loss of consciousness.    Physical Exam:  Vitals:   06/15/17 0951  BP: 125/70  Pulse: 61  Temp: 97.8 F (36.6 C)  TempSrc: Oral  SpO2: 100%  Weight: 138 lb 11.2 oz (62.9 kg)  Height: 5\' 6"  (1.676 m)    Constitutional: NAD, appears comfortable Cardiovascular: RRR, no murmurs, rubs, or gallops.  Pulmonary/Chest: CTAB, no wheezes, rales, or rhonchi. Extremities: Warm and well perfused. No edema.  Neurological: PERRL, EOMI. A&Ox3, CN II - XII grossly intact. No focal deficits.   Assessment & Plan:   See Encounters Tab for problem based charting.  Patient discussed with Dr. Eppie Gibson

## 2017-06-15 NOTE — Patient Instructions (Signed)
FOLLOW-UP INSTRUCTIONS When: 6 months For: PCP follow up What to bring: Medications  Bill [name],  It was a pleasure to see you. Please continue to take your current dilantin dose for now. I am checking your drug level, and will call you with the results. Based on your level, we will adjust or continue your current dose. I have prescribed two separate doses of nicotine patches. Please start with the 14 mg patches once daily. After one month, start using the 7mg  patches for an additional month, then stop. I have also prescribed claritin and flonase for your allergies. Please take claritin once daily. The flonase you may use 2 sprays each nostril daily. If you have any questions or concerns, call our clinic at [phone] or after hours call [phone] and ask for the internal medicine resident on call. Thank you!  - Dr. Philipp Ovens

## 2017-06-16 ENCOUNTER — Encounter: Payer: Self-pay | Admitting: Internal Medicine

## 2017-06-16 LAB — PHENYTOIN LEVEL, TOTAL: Phenytoin (Dilantin), Serum: 2 ug/mL — ABNORMAL LOW (ref 10.0–20.0)

## 2017-06-16 NOTE — Assessment & Plan Note (Signed)
Patient continues to smoke, 1/2 PPD. Previously quit using nicotine patches. He would like to attempt cessation again.  -- 14 g Nicotine patch q24h x 1 month, then decrease 7 g Nicotine patch q24h x 1 month, then stop

## 2017-06-16 NOTE — Assessment & Plan Note (Signed)
Uncontrolled with season change.  -- Claritin 10 mg daily -- Flonase daily

## 2017-06-16 NOTE — Assessment & Plan Note (Signed)
Patient is here for seizure follow up and refill of his medication. He has remained seizure free since his last appointment one year ago. Reports his last seizure was in 2017. Per last office note, patient's dilantin dose was decreased from 300 mg -> 200 mg due to serum level being 18.2 (upper limit of normal). Patient was unaware of his dose reduction and has continued filling his old prescription. I have called pharmacy to verify the dose. We will continue current dose for now, and follow up repeat dilantin level.  -- Continue dilantin 300 mg daily  -- F/u level

## 2017-06-18 NOTE — Progress Notes (Signed)
Case discussed with Dr. Guilloud at the time of the visit. We reviewed the resident's history and exam and pertinent patient test results. I agree with the assessment, diagnosis, and plan of care documented in the resident's note. 

## 2017-06-27 ENCOUNTER — Other Ambulatory Visit: Payer: Self-pay | Admitting: *Deleted

## 2017-06-27 DIAGNOSIS — R569 Unspecified convulsions: Secondary | ICD-10-CM

## 2017-06-27 MED ORDER — PHENYTOIN SODIUM EXTENDED 300 MG PO CAPS: 300.0000 mg | ORAL_CAPSULE | Freq: Every day | ORAL | 2 refills | Status: DC

## 2018-01-24 ENCOUNTER — Ambulatory Visit (INDEPENDENT_AMBULATORY_CARE_PROVIDER_SITE_OTHER): Payer: BLUE CROSS/BLUE SHIELD | Admitting: Internal Medicine

## 2018-01-24 VITALS — BP 117/67 | HR 70 | Temp 98.1°F | Wt 138.4 lb

## 2018-01-24 DIAGNOSIS — Z23 Encounter for immunization: Secondary | ICD-10-CM

## 2018-01-24 DIAGNOSIS — Z720 Tobacco use: Secondary | ICD-10-CM

## 2018-01-24 DIAGNOSIS — M25531 Pain in right wrist: Secondary | ICD-10-CM | POA: Diagnosis not present

## 2018-01-24 DIAGNOSIS — M159 Polyosteoarthritis, unspecified: Secondary | ICD-10-CM | POA: Diagnosis not present

## 2018-01-24 MED ORDER — MELOXICAM 7.5 MG PO TABS: 7.5000 mg | ORAL_TABLET | Freq: Every day | ORAL | 1 refills | Status: DC

## 2018-01-24 MED ORDER — NICOTINE 14 MG/24HR TD PT24: 14.0000 mg | MEDICATED_PATCH | TRANSDERMAL | 0 refills | Status: DC

## 2018-01-24 MED ORDER — DICLOFENAC SODIUM 1 % TD GEL: 2.0000 g | Freq: Four times a day (QID) | TRANSDERMAL | 1 refills | Status: DC | PRN

## 2018-01-24 MED ORDER — NICOTINE 7 MG/24HR TD PT24: 7.0000 mg | MEDICATED_PATCH | TRANSDERMAL | 0 refills | Status: AC

## 2018-01-24 NOTE — Progress Notes (Signed)
   CC: right hand pain  HPI:   [name] is a 57 y.o. male with a medical history of knee and hip arthritis as well as the other medical conditions listed below who presents to the internal medicine clinic for right hand pain. Please see problem based charting for the history and status of the patient's current and chronic medical conditions.   Past Medical History:  Diagnosis Date  . Alcohol abuse    Hx of , stopped in 2000. Had 12 pack for 25 years.   . Arthritis    S/P RIGHT TOTAL HIP ARTHROPLASTY 2007 --PT HAS NOTICED PAIN RIGHT LEG--IS TOLD HE NEEDS RT HIP REVISION  . Avascular necrosis of bone of hip ([place]) 2007   Right  . Chronic chest pain    Normal stress test in 2003  . History of tobacco abuse   . Pleural plaque    MULTIPLE BILATERAL PLEURAL PLAQUES, which had not changed on CT as of 1/04  . Seizure disorder ([place])    Started at age 45. Last EMR record 05/25/09  . Seizures ([place])    PT THINKS GRAND MAL  - LAST TIME PT THINKS WAS 2011    Review of Systems:   Pertinent positives mentioned in HPI. Remainder of all ROS negative.  Physical Exam: Vitals:   01/24/18 0836  BP: 117/67  Pulse: 70  Temp: 98.1 F (36.7 C)  TempSrc: Oral  SpO2: 99%  Weight: 138 lb 6.4 oz (62.8 kg)   Physical Exam  Constitutional: Well-developed, well-nourished, and in no distress.  Eyes: Pupils are equal, round, and reactive to light. EOM are normal.  Ext: Right hand without deformity or wounds. Normal ROM. Brisk cap refill and normal sensation. Allen test normal. Tenderness to palpation at the medial wrist at the point of tendon insertion. Phalen's test negative. Negative dequervain's tenosynovitis testing. Skin: Warm and dry. No rashes or wounds.   Assessment & Plan:   See Encounters Tab for problem based charting.  Patient seen with Dr. Daryll Drown

## 2018-01-24 NOTE — Patient Instructions (Signed)
It was a pleasure taking care of you today, Mr. Holway.  1. For your right wrist pain - This is likely tendonitis, which is inflammation of the muscle tendon.  - Start taking meloxicam 7.5mg  daily.  - Start using voltaren gel up to 4 times a day as needed for pain. Apply this to the painful area. - Start wrapping your hand up with an ACE bandage to avoid hitting the painful area. - If your symptoms have not improved in two weeks, please call our clinic at [phone]. From there, we may need to do additional testing.  2. To help you quit smoking - Start using nicotine patches. Use one 14mg  patch per day for the next month. Then go down to the 7mg  patch for a month.  Thanks, Dr. Annie Paras

## 2018-01-24 NOTE — Assessment & Plan Note (Signed)
Mr. Keil smokes about 10 cigarettes per day. He has had success with nicotine patches in the past and would like to try them again for smoking cessation.   Plan -14g nicotine patch daily for 1 month. Then decrease to 7g nicotine patch daily for 1 month. Then cease completely.

## 2018-01-24 NOTE — Assessment & Plan Note (Addendum)
HPI: Mr. Khachatryan reports a one-month history of a burning pain in his right hand that starts at the medial wrist and radiates into the palm of his hands. It does not involve his fingers. He has tried heating pads and ibuprofen, which temporarily help his pain. The pain is constant, but is worse at night. Hitting the lateral part of his wrist exacerbates the pain. He is right-handed. He details boats for a living. He denies any injury to the wrist or hand.   Assessment: On exam there is tenderness to palpation at the medial right wrist just over a point of tendon insertion. The patient's pain is most likely caused by tendonitis. Will manage with supportive therapy with meloxicam, voltaren gel, and ACE-wrapping the wrist and hand to protect the tender point. If the pain does not improve, consider further workup with xray to rule out arthritis and possibly NCS to rule out a cervical radiculopathy affecting the distal nerve fibers.   Plan 1. Meloxicam 7.5mg  daily 2. Voltaren gel  3. Wrist was wrapped with an ACE-bandage for protection and support today

## 2018-01-24 NOTE — Assessment & Plan Note (Signed)
Flu vaccine administered today.

## 2018-01-25 ENCOUNTER — Telehealth: Payer: Self-pay | Admitting: *Deleted

## 2018-01-25 NOTE — Telephone Encounter (Signed)
Information was sent to CoverMyMeds  For PA for Diclofenac Gel 1%. Approved 01/25/18  thru 01/24/2019.   [facility] was called at [phone].  Sander Nephew, RN 11/220/2019 10:15 AM.

## 2018-02-01 NOTE — Progress Notes (Signed)
Internal Medicine Clinic Attending  I saw and evaluated the patient.  I personally confirmed the key portions of the history and exam documented by Dr. Dorrell and I reviewed pertinent patient test results.  The assessment, diagnosis, and plan were formulated together and I agree with the documentation in the resident's note. 

## 2018-07-11 ENCOUNTER — Other Ambulatory Visit: Payer: Self-pay | Admitting: Internal Medicine

## 2018-07-11 DIAGNOSIS — R569 Unspecified convulsions: Secondary | ICD-10-CM

## 2018-07-11 NOTE — Telephone Encounter (Signed)
Patient has not had a dilantin level followed up and has never seen me for care. Please schedule an appointment with me.

## 2018-07-12 NOTE — Telephone Encounter (Signed)
Appt sch with PCP on 08/11/2018.

## 2018-08-07 DIAGNOSIS — J432 Centrilobular emphysema: Secondary | ICD-10-CM | POA: Insufficient documentation

## 2018-08-07 NOTE — Progress Notes (Signed)
   CC: Seizure disorder  HPI:  [name] is a 58 y.o. with avascular necrosis of right hip, pulmonary nodules, tubular adenoma of colon, seizure disorder, tobacco abuse disorder who presents for follow-up of the above diseases. Please see problem based charting for evaluation, assessment, and plan.  Past Medical History:  Diagnosis Date  . Alcohol abuse    Hx of , stopped in 2000. Had 12 pack for 25 years.   . Arthritis    S/P RIGHT TOTAL HIP ARTHROPLASTY 2007 --PT HAS NOTICED PAIN RIGHT LEG--IS TOLD HE NEEDS RT HIP REVISION  . Avascular necrosis of bone of hip ([place]) 2007   Right  . Chronic chest pain    Normal stress test in 2003  . History of tobacco abuse   . Pleural plaque    MULTIPLE BILATERAL PLEURAL PLAQUES, which had not changed on CT as of 1/04  . Seizure disorder ([place])    Started at age 39. Last EMR record 05/25/09  . Seizures ([place])    PT THINKS GRAND MAL  - LAST TIME PT THINKS WAS 2011   Review of Systems:    Review of Systems  Constitutional: Negative for chills and fever.  Respiratory: Negative for cough.   Cardiovascular: Negative for chest pain.  Gastrointestinal: Negative for abdominal pain, nausea and vomiting.  Musculoskeletal: Negative for myalgias.  Neurological: Negative for dizziness and headaches.   Physical Exam:  Vitals:   08/08/18 1432  BP: 112/70  Pulse: 74  Temp: 98.2 F (36.8 C)  TempSrc: Oral  SpO2: 99%  Weight: 134 lb 4.8 oz (60.9 kg)  Height: 5\' 6"  (1.676 m)   Physical Exam  Constitutional: Appears well-developed and well-nourished. No distress.  HENT:  Head: Normocephalic and atraumatic.  Eyes: Conjunctivae are normal.  Cardiovascular: Normal rate, regular rhythm and normal heart sounds.  Respiratory: Effort normal and breath sounds normal. No respiratory distress. No wheezes.  GI: Soft. Bowel sounds are normal. No distension. There is no tenderness.  Musculoskeletal: No edema.  Neurological: Is alert.  Skin: Not  diaphoretic. No erythema.  Psychiatric: Normal mood and affect. Behavior is normal. Judgment and thought content normal.    Assessment & Plan:   See Encounters Tab for problem based charting.  Patient discussed with Dr. Daryll Drown

## 2018-08-08 ENCOUNTER — Encounter: Payer: Self-pay | Admitting: Internal Medicine

## 2018-08-08 ENCOUNTER — Other Ambulatory Visit: Payer: Self-pay

## 2018-08-08 ENCOUNTER — Ambulatory Visit: Payer: BC Managed Care – PPO | Admitting: Internal Medicine

## 2018-08-08 VITALS — BP 112/70 | HR 74 | Temp 98.2°F | Ht 66.0 in | Wt 134.3 lb

## 2018-08-08 DIAGNOSIS — R918 Other nonspecific abnormal finding of lung field: Secondary | ICD-10-CM

## 2018-08-08 DIAGNOSIS — Z86010 Personal history of colonic polyps: Secondary | ICD-10-CM

## 2018-08-08 DIAGNOSIS — Z0000 Encounter for general adult medical examination without abnormal findings: Secondary | ICD-10-CM | POA: Diagnosis not present

## 2018-08-08 DIAGNOSIS — Z96642 Presence of left artificial hip joint: Secondary | ICD-10-CM

## 2018-08-08 DIAGNOSIS — F17210 Nicotine dependence, cigarettes, uncomplicated: Secondary | ICD-10-CM

## 2018-08-08 DIAGNOSIS — K5730 Diverticulosis of large intestine without perforation or abscess without bleeding: Secondary | ICD-10-CM

## 2018-08-08 DIAGNOSIS — R300 Dysuria: Secondary | ICD-10-CM

## 2018-08-08 DIAGNOSIS — D126 Benign neoplasm of colon, unspecified: Secondary | ICD-10-CM

## 2018-08-08 DIAGNOSIS — M87051 Idiopathic aseptic necrosis of right femur: Secondary | ICD-10-CM

## 2018-08-08 DIAGNOSIS — M25539 Pain in unspecified wrist: Secondary | ICD-10-CM

## 2018-08-08 DIAGNOSIS — Z122 Encounter for screening for malignant neoplasm of respiratory organs: Secondary | ICD-10-CM | POA: Insufficient documentation

## 2018-08-08 DIAGNOSIS — Z8739 Personal history of other diseases of the musculoskeletal system and connective tissue: Secondary | ICD-10-CM

## 2018-08-08 DIAGNOSIS — G40909 Epilepsy, unspecified, not intractable, without status epilepticus: Secondary | ICD-10-CM

## 2018-08-08 DIAGNOSIS — J432 Centrilobular emphysema: Secondary | ICD-10-CM

## 2018-08-08 DIAGNOSIS — R569 Unspecified convulsions: Secondary | ICD-10-CM

## 2018-08-08 DIAGNOSIS — Z79899 Other long term (current) drug therapy: Secondary | ICD-10-CM

## 2018-08-08 DIAGNOSIS — Z720 Tobacco use: Secondary | ICD-10-CM

## 2018-08-08 NOTE — Patient Instructions (Signed)
It was a pleasure to see you today [name]. Please make the following changes:  -I am sorry to hear about your wrist pain. It is possible that you have carpal tunnel syndrome. Please try to wear a wrist splint  -Please take your dilantin daily -Please follow with neurology  If you have any questions or concerns, please call our clinic at [phone] between 9am-5pm and after hours call [phone] and ask for the internal medicine resident on call. If you feel you are having a medical emergency please call 911.   Thank you, we look forward to help you remain healthy!  Lars Mage, MD Internal Medicine PGY2   Carpal Tunnel Syndrome  Carpal tunnel syndrome is a condition that causes pain in your hand and arm. The carpal tunnel is a narrow area that is on the palm side of your wrist. Repeated wrist motion or certain diseases may cause swelling in the tunnel. This swelling can pinch the main nerve in the wrist (median nerve). What are the causes? This condition may be caused by:  Repeated wrist motions.  Wrist injuries.  Arthritis.  A sac of fluid (cyst) or abnormal growth (tumor) in the carpal tunnel.  Fluid buildup during pregnancy. Sometimes the cause is not known. What increases the risk? The following factors may make you more likely to develop this condition:  Having a job in which you move your wrist in the same way many times. This includes jobs like being a Software engineer or a Scientist, water quality.  Being a woman.  Having other health conditions, such as: ? Diabetes. ? Obesity. ? A thyroid gland that is not active enough (hypothyroidism). ? Kidney failure. What are the signs or symptoms? Symptoms of this condition include:  A tingling feeling in your fingers.  Tingling or a loss of feeling (numbness) in your hand.  Pain in your entire arm. This pain may get worse when you bend your wrist and elbow for a long time.  Pain in your wrist that goes up your arm to your shoulder.   Pain that goes down into your palm or fingers.  A weak feeling in your hands. You may find it hard to grab and hold items. You may feel worse at night. How is this diagnosed? This condition is diagnosed with a medical history and physical exam. You may also have tests, such as:  Electromyogram (EMG). This test checks the signals that the nerves send to the muscles.  Nerve conduction study. This test checks how well signals pass through your nerves.  Imaging tests, such as X-rays, ultrasound, and MRI. These tests check for what might be the cause of your condition. How is this treated? This condition may be treated with:  Lifestyle changes. You will be asked to stop or change the activity that caused your problem.  Doing exercise and activities that make bones and muscles stronger (physical therapy).  Learning how to use your hand again (occupational therapy).  Medicines for pain and swelling (inflammation). You may have injections in your wrist.  A wrist splint.  Surgery. Follow these instructions at home: If you have a splint:  Wear the splint as told by your doctor. Remove it only as told by your doctor.  Loosen the splint if your fingers: ? Tingle. ? Lose feeling (become numb). ? Turn cold and blue.  Keep the splint clean.  If the splint is not waterproof: ? Do not let it get wet. ? Cover it with a watertight covering when you  take a bath or a shower. Managing pain, stiffness, and swelling   If told, put ice on the painful area: ? If you have a removable splint, remove it as told by your doctor. ? Put ice in a plastic bag. ? Place a towel between your skin and the bag. ? Leave the ice on for 20 minutes, 2-3 times per day. General instructions  Take over-the-counter and prescription medicines only as told by your doctor.  Rest your wrist from any activity that may cause pain. If needed, talk with your boss at work about changes that can help your wrist heal.   Do any exercises as told by your doctor, physical therapist, or occupational therapist.  Keep all follow-up visits as told by your doctor. This is important. Contact a doctor if:  You have new symptoms.  Medicine does not help your pain.  Your symptoms get worse. Get help right away if:  You have very bad numbness or tingling in your wrist or hand. Summary  Carpal tunnel syndrome is a condition that causes pain in your hand and arm.  It is often caused by repeated wrist motions.  Lifestyle changes and medicines are used to treat this problem. Surgery may help in very bad cases.  Follow your doctor's instructions about wearing a splint, resting your wrist, keeping follow-up visits, and calling for help. This information is not intended to replace advice given to you by your health care provider. Make sure you discuss any questions you have with your health care provider. Document Released: 02/11/2011 Document Revised: 07/01/2017 Document Reviewed: 07/01/2017 Elsevier Interactive Patient Education  2019 [employer].

## 2018-08-09 DIAGNOSIS — N400 Enlarged prostate without lower urinary tract symptoms: Secondary | ICD-10-CM | POA: Insufficient documentation

## 2018-08-09 DIAGNOSIS — C61 Malignant neoplasm of prostate: Secondary | ICD-10-CM | POA: Insufficient documentation

## 2018-08-09 LAB — CMP14 + ANION GAP
ALT: 15 IU/L (ref 0–44)
AST: 15 IU/L (ref 0–40)
Albumin/Globulin Ratio: 1.1 — ABNORMAL LOW (ref 1.2–2.2)
Albumin: 3.9 g/dL (ref 3.8–4.9)
Alkaline Phosphatase: 166 IU/L — ABNORMAL HIGH (ref 39–117)
Anion Gap: 16 mmol/L (ref 10.0–18.0)
BUN/Creatinine Ratio: 22 — ABNORMAL HIGH (ref 9–20)
BUN: 18 mg/dL (ref 6–24)
Bilirubin Total: <0.2 mg/dL (ref 0.0–1.2)
CO2: 25 mmol/L (ref 20–29)
Calcium: 9.3 mg/dL (ref 8.7–10.2)
Chloride: 98 mmol/L (ref 96–106)
Creatinine, Ser: 0.82 mg/dL (ref 0.76–1.27)
GFR calc Af Amer: 113 mL/min/{1.73_m2} (ref >59)
GFR calc non Af Amer: 97 mL/min/{1.73_m2} (ref >59)
Globulin, Total: 3.6 g/dL (ref 1.5–4.5)
Glucose: 81 mg/dL (ref 65–99)
Potassium: 4.5 mmol/L (ref 3.5–5.2)
Sodium: 139 mmol/L (ref 134–144)
Total Protein: 7.5 g/dL (ref 6.0–8.5)

## 2018-08-09 LAB — URINALYSIS, COMPLETE
Bilirubin, UA: NEGATIVE
Glucose, UA: NEGATIVE
Ketones, UA: NEGATIVE
Leukocytes,UA: NEGATIVE
Nitrite, UA: NEGATIVE
Protein,UA: NEGATIVE
RBC, UA: NEGATIVE
Specific Gravity, UA: 1.023 (ref 1.005–1.030)
Urobilinogen, Ur: 0.2 mg/dL (ref 0.2–1.0)
pH, UA: 5 (ref 5.0–7.5)

## 2018-08-09 LAB — MICROSCOPIC EXAMINATION
Bacteria, UA: NONE SEEN
Casts: NONE SEEN /lpf
Epithelial Cells (non renal): NONE SEEN /hpf (ref 0–10)

## 2018-08-09 LAB — CBC
Hematocrit: 39.6 % (ref 37.5–51.0)
Hemoglobin: 13.9 g/dL (ref 13.0–17.7)
MCH: 28.8 pg (ref 26.6–33.0)
MCHC: 35.1 g/dL (ref 31.5–35.7)
MCV: 82 fL (ref 79–97)
Platelets: 321 10*3/uL (ref 150–450)
RBC: 4.82 x10E6/uL (ref 4.14–5.80)
RDW: 13.4 % (ref 11.6–15.4)
WBC: 7.2 10*3/uL (ref 3.4–10.8)

## 2018-08-09 NOTE — Assessment & Plan Note (Signed)
Prior to leaving the patient mentioned that he has been having a 1 month history of pain with urination. He does not note any blood in urine or frothiness in the urine. He does not have any abdominal pain. The patient feels that he has to strain when he urinates.   Assessment and plan  Will order a urinalysis to look for any microscopic hematuria. The patient may have bph, will do dre at next visit. Given the patient is a smoker may need to consider bladder cancer as well. Patient to follow up in 1 month to be further evaluated for this.

## 2018-08-09 NOTE — Assessment & Plan Note (Signed)
Patient's last chest CT done in June 2016 showed stable pulmonary nodules and pleural plaques.  Since the nodules were benign over 3-year follow-up no further evaluation was recommended.

## 2018-08-09 NOTE — Assessment & Plan Note (Signed)
The patient currently smokes 1/2ppd. He has smoked since he was about 58 years of age.  Assessment and plan  States that he wants to quit smoking but does not want to set a quit date at this time. He has patches and lozenges available.

## 2018-08-09 NOTE — Assessment & Plan Note (Signed)
Since last colonoscopy in August 2016 showed mild diverticulosis in left colon.  He has a history of adenomatous/precancerous polyps found on colonoscopy in 2013 by Dr. Ardis Hughs (1 noted to be 1.5 cm in size) for which he should have a colonoscopy repeated in 5 years.  Assessment and plan  -The patient should have a repeat colonoscopy done by August 2021

## 2018-08-09 NOTE — Assessment & Plan Note (Signed)
Patient had a right hip replacement in 2007 secondary to avascular necrosis.  He has not had pain in this area since several years.

## 2018-08-09 NOTE — Progress Notes (Signed)
Internal Medicine Clinic Attending  Case discussed with Dr. Chundi at the time of the visit.  We reviewed the resident's history and exam and pertinent patient test results.  I agree with the assessment, diagnosis, and plan of care documented in the resident's note. 

## 2018-08-09 NOTE — Assessment & Plan Note (Signed)
The patient has seizure disorder since age 58. He states his last seizure was noted to be about 3-5years ago.  The patient is currently taking Dilantin 300 mg daily, but states he misses his medication approximately 1-2 times per week.  His last Dilantin level was noted to be 2.0 in April 2019.  Assessment and plan The patient states that he noted his seizures after he stopped drinking alcohol. He used to drink large quantities of alcohol from a young age (prior to age 89). Therefore, I wonder if the patient may have had alcohol withdrawal seizures rather than a true seizure disorder.   -Referral to neurology to evaluate seizure disorder and determine if patient can be taken off his anti-epileptics. He has been seizure free for few years now while on dilantin.  -Will check dilantin level at follow up in 1 month. Counseled patient on the importance of medication adherence

## 2018-08-11 ENCOUNTER — Encounter: Payer: BLUE CROSS/BLUE SHIELD | Admitting: Internal Medicine

## 2018-08-22 ENCOUNTER — Encounter: Payer: Self-pay | Admitting: *Deleted

## 2018-10-02 ENCOUNTER — Other Ambulatory Visit: Payer: Self-pay

## 2018-10-02 ENCOUNTER — Ambulatory Visit: Payer: BC Managed Care – PPO | Admitting: Neurology

## 2018-10-02 ENCOUNTER — Encounter: Payer: Self-pay | Admitting: Neurology

## 2018-10-02 VITALS — BP 105/68 | HR 70 | Ht 66.5 in | Wt 132.0 lb

## 2018-10-02 DIAGNOSIS — G40909 Epilepsy, unspecified, not intractable, without status epilepticus: Secondary | ICD-10-CM

## 2018-10-02 NOTE — Patient Instructions (Signed)
  Please remember, common seizure triggers are: Sleep deprivation, dehydration, overheating, stress, hypoglycemia or skipping meals, certain medications or excessive alcohol use, especially stopping alcohol abruptly if you have had heavy alcohol use before (aka alcohol withdrawal seizure). If you have a prolonged seizure over 2-5 minutes or back to back seizures, call or have someone call 911 or take you to the nearest emergency room. You cannot drive a car or operate any other machinery or vehicle within 6 months of a seizure. Please do not swim alone or take a tub bath for safety. Do not climb on ladders or be at heights alone. Do not cook with large quantities of boiling water or oil for safety. Please ensure the water temperature at home is not too high. When carrying or caring for small children and infants, make sure you sit down when holding the child are feeding the child or changing them to minimize risk for injury to the child are to you if you were to have a seizure.  Take your medicine for seizure prevention regularly and do not skip doses or stop medication abruptly and tone are told to do so by your healthcare provider. Try to get a refill on your antiepileptic medication ahead of time, so you are not at risk of running out. If you run out of the seizure medication and do not have a refill at hand she may run into medication withdrawal seizures. Avoid taking Wellbutrin, narcotic pain medications and tramadol, as they can lower seizure threshold.  As per [facility] DMP statutes, patients with seizures and epilepsy are not allowed to drive until they have been seizure-free for at least 6 months. This also applies to driving or using heavy equipment or power tools.  We will do evaluations in the form of Blood work, We will do an EEG (brainwave test), which we will schedule. We will call you with the results. We will also request a bone density scan. We will do a brain scan, called MRI and call  you with the test results. We will have to schedule you for this on a separate date. This test requires authorization from your insurance, and we will take care of the insurance process.  Please follow up in 6 months, we will call with your test results.

## 2018-10-02 NOTE — Progress Notes (Signed)
Subjective:    Patient ID: Bill [name] is a 58 y.o. male.  HPI     Star Age, MD, PhD [facility] [address] [place], [place] [address]  Dear Drs. Chundi and Valdese,   I saw your patient, Bill [name]. Bill [name], upon your kind request to my neurologic clinic today for initial consultation of his seizure disorder.  The patient is unaccompanied today.  As you know, [name] is a 58 year old right-handed gentleman with an underlying medical history of arthritis, history of alcohol abuse, smoking, history of chest pain, avascular necrosis of the hip, who was diagnosed with seizures as a teenager.  He has a longstanding history of alcohol abuse which started as a teenager.  He has not had any alcohol in about 30 years, and reports ongoing abstinence.  He continues to smoke, reports smoking less than 1 pack/day.  He has been on Dilantin for years, probably over 20 years he estimates.  He reports that he tries to take it during the day, 1 pill of the 300 mg strength but admits that he sometimes forgets it.  He is not sure if he has been seen by neurology before.  He reports having grand mal seizures and following a seizure he would get sleepy.  He estimates that he was about 15 or 16 when he was first diagnosed with seizures.  He has not had a seizure in 3 or maybe 4 years.  His previous family doctor in [place] started him on Dilantin.  He believes he has had a brain MRI and EEG.  Records are not available for my review today.  He does not know where records could be.he is single and lives alone, he has no children.  He does drive a car.  He works Hospital doctor.  He does not have to work close to the water.  He does not believe he has had any side effects from the Dilantin.  He reports that he does not drink alcohol, drinks caffeine in the form of coffee, about 2 cups/day on average.  He denies any symptoms of balance issues or incoordination or  headaches.    His Past Medical History Is Significant For: Past Medical History:  Diagnosis Date  . Alcohol abuse    Hx of , stopped in 2000. Had 12 pack for 25 years.   . Arthritis    S/P RIGHT TOTAL HIP ARTHROPLASTY 2007 --PT HAS NOTICED PAIN RIGHT LEG--IS TOLD HE NEEDS RT HIP REVISION  . Avascular necrosis of bone of hip ([place]) 2007   Right  . Chronic chest pain    Normal stress test in 2003  . History of tobacco abuse   . Pleural plaque    MULTIPLE BILATERAL PLEURAL PLAQUES, which had not changed on CT as of 1/04  . Seizure disorder ([place])    Started at age 82. Last EMR record 05/25/09  . Seizures ([place])    PT THINKS GRAND MAL  - LAST TIME PT THINKS WAS 2011    His Past Surgical History Is Significant For: Past Surgical History:  Procedure Laterality Date  . Right Hip Replacement  8/07 / 2013   Dr. Alvan Dame    His Family History Is Significant For: Family History  Problem Relation Age of Onset  . Heart attack Father        66s  . Arthritis Mother   . Colon cancer Neg Hx   . Seizures Brother  His Social History Is Significant For: Social History   Socioeconomic History  . Marital status: Single    Spouse name: Not on file  . Number of children: Not on file  . Years of education: Not on file  . Highest education level: Not on file  Occupational History  . Not on file  Social Needs  . Financial resource strain: Not on file  . Food insecurity    Worry: Not on file    Inability: Not on file  . Transportation needs    Medical: Not on file    Non-medical: Not on file  Tobacco Use  . Smoking status: Current Every Day Smoker    Packs/day: 0.50    Years: 20.00    Pack years: 10.00    Types: Cigarettes    Last attempt to quit: 03/08/2016    Years since quitting: 2.5  . Smokeless tobacco: Never Used  Substance and Sexual Activity  . Alcohol use: No    Alcohol/week: 0.0 standard drinks    Comment: Former  . Drug use: No  . Sexual activity: Yes    Birth  control/protection: Condom    Comment: 1 Partner,   Lifestyle  . Physical activity    Days per week: Not on file    Minutes per session: Not on file  . Stress: Not on file  Relationships  . Social Herbalist on phone: Not on file    Gets together: Not on file    Attends religious service: Not on file    Active member of club or organization: Not on file    Attends meetings of clubs or organizations: Not on file    Relationship status: Not on file  Other Topics Concern  . Not on file  Social History Narrative   Custodian, single.     His Allergies Are:  No Known Allergies:   His Current Medications Are:  Outpatient Encounter Medications as of 10/02/2018  Medication Sig  . nicotine (NICODERM CQ - DOSED IN MG/24 HOURS) 14 mg/24hr patch Place 1 patch (14 mg total) onto the skin daily.  . nicotine (NICODERM CQ - DOSED IN MG/24 HR) 7 mg/24hr patch Place 1 patch (7 mg total) onto the skin daily.  . phenytoin (DILANTIN) 300 MG ER capsule TAKE ONE CAPSULE BY MOUTH DAILY(DOCTOR NOTE: DO NOT COMBINE THIS MEDICATION WITH ANOTHER ONE).  . [DISCONTINUED] atorvastatin (LIPITOR) 40 MG tablet Take 1 tablet (40 mg total) by mouth daily. (Patient not taking: Reported on 01/02/2016)  . [DISCONTINUED] diclofenac sodium (VOLTAREN) 1 % GEL Apply 2 g topically 4 (four) times daily as needed.  . [DISCONTINUED] ibuprofen (ADVIL,MOTRIN) 800 MG tablet Take 1 tablet (800 mg total) by mouth every 8 (eight) hours as needed.  . [DISCONTINUED] loratadine (CLARITIN) 10 MG tablet Take 1 tablet (10 mg total) by mouth daily.  . [DISCONTINUED] nicotine polacrilex (NICOTINE MINI) 2 MG lozenge Take 1 lozenge (2 mg total) by mouth as needed for smoking cessation.   No facility-administered encounter medications on file as of 10/02/2018.   :   Review of Systems:  Out of a complete 14 point review of systems, all are reviewed and negative with the exception of these symptoms as listed below:    Review of  Systems  Neurological:       Pt presents today to discuss his seizures. His last seizure was 3-4 years ago. He is taking dilantin.    Objective:  Neurological Exam  Physical Exam  Physical Examination:   Vitals:   10/02/18 1007  BP: 105/68  Pulse: 70    General Examination: The patient is a very pleasant 58 y.o. male in no acute distress. He appears well-developed and well-nourished and well groomed.   HEENT: Normocephalic, atraumatic, pupils are equal, round and reactive to light and accommodation. Funduscopic exam is normal with sharp disc margins noted. Extraocular tracking is good without limitation to gaze excursion or nystagmus noted. Normal smooth pursuit is noted. Hearing is grossly intact. Tympanic membranes are clear bilaterally. Face is symmetric with normal facial animation and normal facial sensation. Speech is clear with no dysarthria noted. There is no hypophonia. There is no lip, neck/head, jaw or voice tremor. Neck is supple with full range of passive and active motion. There are no carotid bruits on auscultation. Oropharynx exam reveals: moderate mouth dryness, full dentures, moderate airway crowding, due to Redundant soft palate and larger uvula.  Mallampati is class II.  Neck circumference is 14 inches. Tongue protrudes centrally and palate elevates symmetrically.   Chest: Clear to auscultation without wheezing, rhonchi or crackles noted.  Heart: S1+S2+0, regular and normal without murmurs, rubs or gallops noted.   Abdomen: Soft, non-tender and non-distended with normal bowel sounds appreciated on auscultation.  Extremities: There is no pitting edema in the distal lower extremities bilaterally. Pedal pulses are intact.  Skin: Warm and dry without trophic changes noted. There are no varicose veins.  Musculoskeletal: exam reveals no obvious joint deformities, tenderness or joint swelling or erythema.   Neurologically:  Mental status: The patient is awake, alert and  oriented in all 4 spheres. His immediate and remote memory, attention, language skills and fund of knowledge are fairly appropriate, but Difficulty with details on his history. There is no evidence of aphasia, agnosia, apraxia or anomia. Speech is clear with normal prosody and enunciation. Thought process is linear. Mood is normal and affect is normal.  Cranial nerves II - XII are as described above under HEENT exam. In addition: shoulder shrug is normal with equal shoulder height noted. Motor exam: Normal bulk, strength and tone is noted. There is no drift, tremor or rebound. Romberg is negative. Reflexes are 2+ throughout. Babinski: Toes are flexor bilaterally. Fine motor skills and coordination: intact with normal finger taps, normal hand movements, normal rapid alternating patting, normal foot taps and normal foot agility.  Cerebellar testing: No dysmetria or intention tremor on finger to nose testing. Heel to shin is unremarkable bilaterally. There is no truncal or gait ataxia.  Sensory exam: intact to light touch in the upper and lower extremities.  Gait, station and balance: He stands easily. No veering to one side is noted. No leaning to one side is noted. Posture is age-appropriate and stance is narrow based. Gait shows normal stride length and normal pace. No problems turning are noted. Tandem walk is unremarkable. And    Assessment and Plan:    In summary, EITAN DOUBLEDAY is a very pleasant 58 y.o.-year old male with an underlying medical history of arthritis, history of alcohol abuse, smoking, history of chest pain, avascular necrosis of the hip, who Presents for evaluation of his seizure disorder.  He was diagnosed when he was a teenager of 68 or 58 years of age.  He reports a history of alcohol abuse when he was a teenager.  He believes that he was told at one point that he has alcohol withdrawal seizures.  He certainly has not had a seizure by his report and  about 4 years or longer.  He  describes grand mal seizures.  He has a nonfocal neurological exam and reports fairly good compliance with his Dilantin.  Has been on it for over 20 years as I understand.  We will check some blood work today including Dilantin level, CBC and chemistry.  I also advised him that long-term use of Dilantin can cause gingival hypertrophy, problems with reduced bone density.  To that end, I would like to proceed with a DEXA scan.  In addition, we will proceed with a brain MRI with and without contrast and an EEG for better understanding of his underlying seizures hopefully.  He is advised to continue with his current medication, we talked about seizure precautions and she seizure triggers today.  He is advised not to drive should he have a seizure, for at least 6 months.  He is also advised not to be close to water, swim in the pool, take a bath, be at heights.We will call him with his test results, he is advised to follow-up routinely in 6 months. I answered all his questions today and the patient was in agreement with the above outlined plan.   Thank you very much for allowing me to participate in the care of this nice patient. If I can be of any further assistance to you please do not hesitate to call me at [phone].  Sincerely,   Star Age, MD, PhD

## 2018-10-03 ENCOUNTER — Telehealth: Payer: Self-pay | Admitting: Neurology

## 2018-10-03 NOTE — Telephone Encounter (Signed)
no to the covid-19 questions MR Brain w/wo contrast Dr. Reece Leader Auth: [id number] (exp. 10/02/18 to 03/30/19). Patient is scheduled at [facility] for 10/10/18.

## 2018-10-04 ENCOUNTER — Telehealth: Payer: Self-pay

## 2018-10-04 LAB — CBC WITH DIFFERENTIAL/PLATELET
Basophils Absolute: 0.1 10*3/uL (ref 0.0–0.2)
Basos: 1 %
EOS (ABSOLUTE): 0.4 10*3/uL (ref 0.0–0.4)
Eos: 5 %
Hematocrit: 38.4 % (ref 37.5–51.0)
Hemoglobin: 13.1 g/dL (ref 13.0–17.7)
Immature Grans (Abs): 0 10*3/uL (ref 0.0–0.1)
Immature Granulocytes: 0 %
Lymphocytes Absolute: 1.7 10*3/uL (ref 0.7–3.1)
Lymphs: 24 %
MCH: 28.2 pg (ref 26.6–33.0)
MCHC: 34.1 g/dL (ref 31.5–35.7)
MCV: 83 fL (ref 79–97)
Monocytes Absolute: 0.4 10*3/uL (ref 0.1–0.9)
Monocytes: 5 %
Neutrophils Absolute: 4.5 10*3/uL (ref 1.4–7.0)
Neutrophils: 65 %
Platelets: 347 10*3/uL (ref 150–450)
RBC: 4.65 x10E6/uL (ref 4.14–5.80)
RDW: 13.3 % (ref 11.6–15.4)
WBC: 7.1 10*3/uL (ref 3.4–10.8)

## 2018-10-04 LAB — COMPREHENSIVE METABOLIC PANEL
ALT: 11 IU/L (ref 0–44)
AST: 14 IU/L (ref 0–40)
Albumin/Globulin Ratio: 1 — ABNORMAL LOW (ref 1.2–2.2)
Albumin: 3.7 g/dL — ABNORMAL LOW (ref 3.8–4.9)
Alkaline Phosphatase: 164 IU/L — ABNORMAL HIGH (ref 39–117)
BUN/Creatinine Ratio: 17 (ref 9–20)
BUN: 17 mg/dL (ref 6–24)
Bilirubin Total: <0.2 mg/dL (ref 0.0–1.2)
CO2: 22 mmol/L (ref 20–29)
Calcium: 9.1 mg/dL (ref 8.7–10.2)
Chloride: 101 mmol/L (ref 96–106)
Creatinine, Ser: 1.02 mg/dL (ref 0.76–1.27)
GFR calc Af Amer: 93 mL/min/{1.73_m2} (ref >59)
GFR calc non Af Amer: 81 mL/min/{1.73_m2} (ref >59)
Globulin, Total: 3.6 g/dL (ref 1.5–4.5)
Glucose: 81 mg/dL (ref 65–99)
Potassium: 4.5 mmol/L (ref 3.5–5.2)
Sodium: 139 mmol/L (ref 134–144)
Total Protein: 7.3 g/dL (ref 6.0–8.5)

## 2018-10-04 LAB — AMMONIA: Ammonia: 64 ug/dL (ref 40–200)

## 2018-10-04 LAB — PHENYTOIN LEVEL, FREE: Phenytoin, Free: NOT DETECTED ug/mL (ref 1.0–2.0)

## 2018-10-04 LAB — PHENYTOIN LEVEL, TOTAL: Phenytoin (Dilantin), Serum: 3.6 ug/mL — ABNORMAL LOW (ref 10.0–20.0)

## 2018-10-04 NOTE — Progress Notes (Signed)
Labs indicate normal cell count, and kidney function, 1 of the liver enzymes called alkaline phosphatase was a little elevated but has been up in the same range before, typically seen in the context of taking chronic medication.  His phenytoin level was low including the total Dilantin level Which was in the subtherapeutic range, as well as the free Dilantin level, which is essentially not detected.  This indicates that he is likely not taking the medication as regularly as he should be.  Please reinforce to the patient that he should take all his medications daily including his current Dilantin.  Bill [name]

## 2018-10-04 NOTE — Telephone Encounter (Signed)
I called and discussed pt's lab result and recommendations. I reminded pt to take his dilantin daily as prescribed. Pt verbalized understanding of results and recommendations. Pt had no questions at this time but was encouraged to call back if questions arise.

## 2018-10-04 NOTE — Telephone Encounter (Signed)
-----   Message from Star Age, MD sent at 10/04/2018  4:45 PM EDT ----- Labs indicate normal cell count, and kidney function, 1 of the liver enzymes called alkaline phosphatase was a little elevated but has been up in the same range before, typically seen in the context of taking chronic medication.  His phenytoin level was low including the total Dilantin level Which was in the subtherapeutic range, as well as the free Dilantin level, which is essentially not detected.  This indicates that he is likely not taking the medication as regularly as he should be.  Please reinforce to the patient that he should take all his medications daily including his current Dilantin.  Michel Bickers

## 2018-10-04 NOTE — Telephone Encounter (Signed)
I called pt to discuss his lab results. No answer, left a message asking him to call me back.

## 2018-10-10 ENCOUNTER — Other Ambulatory Visit: Payer: Self-pay

## 2018-10-10 ENCOUNTER — Ambulatory Visit: Payer: BC Managed Care – PPO

## 2018-10-10 DIAGNOSIS — G40909 Epilepsy, unspecified, not intractable, without status epilepticus: Secondary | ICD-10-CM

## 2018-10-10 MED ORDER — GADOBENATE DIMEGLUMINE 529 MG/ML IV SOLN
12.0000 mL | Freq: Once | INTRAVENOUS | Status: AC | PRN
  Administered 2018-10-10: 12 mL via INTRAVENOUS

## 2018-10-12 ENCOUNTER — Telehealth: Payer: Self-pay

## 2018-10-12 NOTE — Telephone Encounter (Signed)
I called pt to discuss his MRI results. No answer, left a message asking him to call me back.

## 2018-10-12 NOTE — Telephone Encounter (Signed)
-----   Message from Star Age, MD sent at 10/12/2018  4:57 PM EDT ----- Please update patient that his brain MRI with and without contrast showed no abnormalities, follow-up as scheduled.

## 2018-10-12 NOTE — Progress Notes (Signed)
Please update patient that his brain MRI with and without contrast showed no abnormalities, follow-up as scheduled.

## 2018-10-16 ENCOUNTER — Encounter: Payer: Self-pay | Admitting: Neurology

## 2018-10-16 NOTE — Progress Notes (Signed)
   CC: Left Ankle pain  HPI:  [name] is a 58 y.o. with seizure disorder, tubular adenoma of colon, hld, tobacco use disorder who presented for follow up. Please see problem based charting for evaluation, assessment, and plan.  Past Medical History:  Diagnosis Date  . Alcohol abuse    Hx of , stopped in 2000. Had 12 pack for 25 years.   . Arthritis    S/P RIGHT TOTAL HIP ARTHROPLASTY 2007 --PT HAS NOTICED PAIN RIGHT LEG--IS TOLD HE NEEDS RT HIP REVISION  . Avascular necrosis of bone of hip (Bill [name]) 2007   Right  . Chronic chest pain    Normal stress test in 2003  . History of tobacco abuse   . Pleural plaque    MULTIPLE BILATERAL PLEURAL PLAQUES, which had not changed on CT as of 1/04  . Seizure disorder (Bill [name])    Started at age 61. Last EMR record 05/25/09  . Seizures (Bill [name])    PT THINKS GRAND MAL  - LAST TIME PT THINKS WAS 2011   Review of Systems:    Review of Systems  Constitutional: Negative for chills and fever.  Respiratory: Negative for cough and shortness of breath.   Cardiovascular: Negative for chest pain.  Gastrointestinal: Negative for abdominal pain, nausea and vomiting.  Musculoskeletal: Negative for falls and myalgias.  Neurological: Negative for dizziness, tingling and headaches.  Psychiatric/Behavioral: Negative for depression.   Physical Exam:  Vitals:   10/17/18 1456 10/17/18 1544  BP: (!) 98/56 (!) 108/56  Pulse: 62 (!) 58  Temp: 98.3 F (36.8 C)   TempSrc: Oral   SpO2: 100% 100%  Weight: 130 lb 14.4 oz (59.4 kg)   Height: 5\' 7"  (1.702 m)    Physical Exam  Constitutional: Appears well-developed and well-nourished. No distress.  HENT:  Head: Normocephalic and atraumatic.  Eyes: Conjunctivae are normal.  Cardiovascular: Normal rate, regular rhythm and normal heart sounds.  Respiratory: Effort normal and breath sounds normal. No respiratory distress. No wheezes.  GI: Soft. Bowel sounds are normal. No distension. There is no tenderness.   Musculoskeletal: No edema. Left foot pain in first mtp joint. No significant erythema, swelling or warmth to touch. 5/5 muscle strength in bilateral feet. Neurological: Is alert.  Skin: Not diaphoretic. No erythema.  Psychiatric: Normal mood and affect. Behavior is normal. Judgment and thought content normal.   Assessment & Plan:   See Encounters Tab for problem based charting.  Patient discussed with Dr. Evette Doffing

## 2018-10-17 ENCOUNTER — Ambulatory Visit: Payer: BC Managed Care – PPO | Admitting: Internal Medicine

## 2018-10-17 ENCOUNTER — Other Ambulatory Visit: Payer: Self-pay

## 2018-10-17 ENCOUNTER — Encounter: Payer: Self-pay | Admitting: Internal Medicine

## 2018-10-17 VITALS — BP 108/56 | HR 58 | Temp 98.3°F | Ht 67.0 in | Wt 130.9 lb

## 2018-10-17 DIAGNOSIS — Z79899 Other long term (current) drug therapy: Secondary | ICD-10-CM

## 2018-10-17 DIAGNOSIS — M79672 Pain in left foot: Secondary | ICD-10-CM

## 2018-10-17 DIAGNOSIS — M25572 Pain in left ankle and joints of left foot: Secondary | ICD-10-CM | POA: Diagnosis not present

## 2018-10-17 DIAGNOSIS — D129 Benign neoplasm of anus and anal canal: Secondary | ICD-10-CM | POA: Diagnosis not present

## 2018-10-17 DIAGNOSIS — E785 Hyperlipidemia, unspecified: Secondary | ICD-10-CM

## 2018-10-17 DIAGNOSIS — J432 Centrilobular emphysema: Secondary | ICD-10-CM

## 2018-10-17 DIAGNOSIS — R569 Unspecified convulsions: Secondary | ICD-10-CM

## 2018-10-17 DIAGNOSIS — Z720 Tobacco use: Secondary | ICD-10-CM

## 2018-10-17 DIAGNOSIS — G40909 Epilepsy, unspecified, not intractable, without status epilepticus: Secondary | ICD-10-CM | POA: Diagnosis not present

## 2018-10-17 DIAGNOSIS — F17210 Nicotine dependence, cigarettes, uncomplicated: Secondary | ICD-10-CM

## 2018-10-17 NOTE — Patient Instructions (Addendum)
It was a pleasure to see you today Bill [name]. Please make the following changes:  -Please continue taking pheytoin 300mg  daily for your seizures  -Your foot pain appears to be due to plantar fasciitis. Please make sure to rest your foot and apply ice as needed. If your symptoms persist past 6 weeks please return to clinic   If you have any questions or concerns, please call our clinic at [phone] between 9am-5pm and after hours call [phone] and ask for the internal medicine resident on call. If you feel you are having a medical emergency please call 911.   Thank you, we look forward to help you remain healthy!  Lars Mage, MD Internal Medicine PGY3    Plantar Fasciitis  Plantar fasciitis is a painful foot condition that affects the heel. It occurs when the band of tissue that connects the toes to the heel bone (plantar fascia) becomes irritated. This can happen as the result of exercising too much or doing other repetitive activities (overuse injury). The pain from plantar fasciitis can range from mild irritation to severe pain that makes it difficult to walk or move. The pain is usually worse in the morning after sleeping, or after sitting or lying down for a while. Pain may also be worse after long periods of walking or standing. What are the causes? This condition may be caused by:  Standing for long periods of time.  Wearing shoes that do not have good arch support.  Doing activities that put stress on joints (high-impact activities), including running, aerobics, and ballet.  Being overweight.  An abnormal way of walking (gait).  Tight muscles in the back of your lower leg (calf).  High arches in your feet.  Starting a new athletic activity. What are the signs or symptoms? The main symptom of this condition is heel pain. Pain may:  Be worse with first steps after a time of rest, especially in the morning after sleeping or after you have been sitting or lying  down for a while.  Be worse after long periods of standing still.  Decrease after 30-45 minutes of activity, such as gentle walking. How is this diagnosed? This condition may be diagnosed based on your medical history and your symptoms. Your health care provider may ask questions about your activity level. Your health care provider will do a physical exam to check for:  A tender area on the bottom of your foot.  A high arch in your foot.  Pain when you move your foot.  Difficulty moving your foot. You may have imaging tests to confirm the diagnosis, such as:  X-rays.  Ultrasound.  MRI. How is this treated? Treatment for plantar fasciitis depends on how severe your condition is. Treatment may include:  Rest, ice, applying pressure (compression), and raising the affected foot (elevation). This may be called RICE therapy. Your health care provider may recommend RICE therapy along with over-the-counter pain medicines to manage your pain.  Exercises to stretch your calves and your plantar fascia.  A splint that holds your foot in a stretched, upward position while you sleep (night splint).  Physical therapy to relieve symptoms and prevent problems in the future.  Injections of steroid medicine (cortisone) to relieve pain and inflammation.  Stimulating your plantar fascia with electrical impulses (extracorporeal shock wave therapy). This is usually the last treatment option before surgery.  Surgery, if other treatments have not worked after 12 months. Follow these instructions at home:  Managing pain, stiffness, and swelling  If directed, put ice on the painful area: ? Put ice in a plastic bag, or use a frozen bottle of water. ? Place a towel between your skin and the bag or bottle. ? Roll the bottom of your foot over the bag or bottle. ? Do this for 20 minutes, 2-3 times a day.  Wear athletic shoes that have air-sole or gel-sole cushions, or try wearing soft shoe inserts  that are designed for plantar fasciitis.  Raise (elevate) your foot above the level of your heart while you are sitting or lying down. Activity  Avoid activities that cause pain. Ask your health care provider what activities are safe for you.  Do physical therapy exercises and stretches as told by your health care provider.  Try activities and forms of exercise that are easier on your joints (low-impact). Examples include swimming, water aerobics, and biking. General instructions  Take over-the-counter and prescription medicines only as told by your health care provider.  Wear a night splint while sleeping, if told by your health care provider. Loosen the splint if your toes tingle, become numb, or turn cold and blue.  Maintain a healthy weight, or work with your health care provider to lose weight as needed.  Keep all follow-up visits as told by your health care provider. This is important. Contact a health care provider if you:  Have symptoms that do not go away after caring for yourself at home.  Have pain that gets worse.  Have pain that affects your ability to move or do your daily activities. Summary  Plantar fasciitis is a painful foot condition that affects the heel. It occurs when the band of tissue that connects the toes to the heel bone (plantar fascia) becomes irritated.  The main symptom of this condition is heel pain that may be worse after exercising too much or standing still for a long time.  Treatment varies, but it usually starts with rest, ice, compression, and elevation (RICE therapy) and over-the-counter medicines to manage pain. This information is not intended to replace advice given to you by your health care provider. Make sure you discuss any questions you have with your health care provider. Document Released: 11/17/2000 Document Revised: 02/04/2017 Document Reviewed: 12/20/2016 Elsevier Patient Education  2020 [employer].

## 2018-10-18 ENCOUNTER — Ambulatory Visit: Payer: BC Managed Care – PPO | Admitting: Neurology

## 2018-10-18 DIAGNOSIS — M79672 Pain in left foot: Secondary | ICD-10-CM | POA: Insufficient documentation

## 2018-10-18 DIAGNOSIS — G40909 Epilepsy, unspecified, not intractable, without status epilepticus: Secondary | ICD-10-CM | POA: Diagnosis not present

## 2018-10-18 NOTE — Assessment & Plan Note (Signed)
The patient is currently taking phenytoin 300mg  qd. He has established with neurology Dr. Rexene Alberts. He had an MRI brain in August 2020 which did not show any intracranial abnormality.  His last seizure was 5 yrs ago. He has been getting good amount of sleep 8-9hrs daily.   Assessment and plan   -continue dilantin 300mg  qd  -Advised patient to remain well hydrated and continue to get good amount of sleep

## 2018-10-18 NOTE — Assessment & Plan Note (Signed)
Patient has few month history of left ankle pain. Patient localizes the pain to first metatarsal region and describes as 5/10 intensity, throbbing in nature, worsened when he walks. He states that the pain does not radiate to any other region of his foot and is not present at heel. His plantar arches appear normal. He has not had any trauma to his foot. There is no significant warmth, erythema, or swelling to the foot or first toe.  Assessment and plan  The patient likely has plantar fasciitis and we recommended conservative therapy with rest, ice, and elevation. I told the patient to return to clinic if pain has not subsided in 1 month.

## 2018-10-18 NOTE — Assessment & Plan Note (Signed)
Patient continues to smoke 1/2 ppd daily. Last Ct chest imaging showed centrilobular emphysema.   Assessment and plan  Spoke to patient at length about the importance of smoking cessation. We spoke about different techniques for the patient to cut down on smoking. He has patches that he will try to use.   -follow up in 6 weeks to assess whether patient has cut down on smoking  -avoid bupropion for smoking cessation as patient has concomitant diagnosis of seizure disorder.

## 2018-10-18 NOTE — Procedures (Signed)
    History:  Bill [name] is a 58 year old patient with a history of a seizure disorder.  He has a prior history of alcohol abuse, but he was diagnosed with seizures as a teenager.  The patient has generalized seizures.  He is being evaluated for this issue.    This is a routine EEG.  No skull defects are noted.  Medications include Dilantin and a NicoDerm patch.  EEG classification: Normal awake and drowsy  Description of the recording: The background rhythms of this recording consists of a fairly well modulated medium amplitude alpha rhythm of 8 Hz that is reactive to eye opening and closure. As the record progresses, the patient appears to remain in the waking state throughout the recording. Photic stimulation was performed, resulting in a bilateral and symmetric photic driving response. Hyperventilation was also performed, resulting in a minimal buildup of the background rhythm activities without significant slowing seen. Toward the end of the recording, the patient enters the drowsy state with slight symmetric slowing seen. The patient never enters stage II sleep. At no time during the recording does there appear to be evidence of spike or spike wave discharges or evidence of focal slowing. EKG monitor shows no evidence of cardiac rhythm abnormalities with a heart rate of 60.  Impression: This is a normal EEG recording in the waking and drowsy state. No evidence of ictal or interictal discharges are seen.

## 2018-10-18 NOTE — Progress Notes (Signed)
Internal Medicine Clinic Attending  Case discussed with Dr. Chundi at the time of the visit.  We reviewed the resident's history and exam and pertinent patient test results.  I agree with the assessment, diagnosis, and plan of care documented in the resident's note. 

## 2018-10-18 NOTE — Assessment & Plan Note (Deleted)
Patient has few month history of left ankle pain. Patient localizes the pain to first metatarsal region and describes as 5/10 intensity, throbbing in nature, worsened when he walks. He states that the pain does not radiate to any other region of his foot and is not present at heel. His plantar arches appear normal. He has not had any trauma to his foot. There is no significant warmth, erythema, or swelling to the foot or first toe.  Assessment and plan  The patient likely has plantar fasciitis and we recommended conservative therapy with rest, ice, and elevation. I told the patient to return to clinic if pain has not subsided in 1 month.

## 2018-10-19 ENCOUNTER — Telehealth: Payer: Self-pay | Admitting: *Deleted

## 2018-10-19 NOTE — Telephone Encounter (Signed)
I spoke with the pt and advised his EEG results are normal. There was no seizure activity noted. I advised Dr. Rexene Alberts is out of the office at this time but has received a copy to review  and if there are any further steps we will give him a call back. He verbalized understanding and appreciation. He had no questions.

## 2018-10-19 NOTE — Telephone Encounter (Signed)
-----   Message from Melvenia Beam, MD sent at 10/19/2018 10:59 AM EDT ----- eeg normal. Nehal Witting please let patient know and any further workup if needed when Dr. Rexene Alberts gets back, I will send this result to Dr. Rexene Alberts as well thanks

## 2018-10-23 NOTE — Telephone Encounter (Signed)
I called pt again to discuss his MRI results. No answer, left a message asking him to call me back.

## 2018-10-26 NOTE — Telephone Encounter (Signed)
Notes recorded by Belinda Block, [place] on 10/23/2018 at 5:07 PM EDT  Patient returned call. I reviewed MRI results with him. Patient voiced understanding and appreciation.  ------

## 2018-11-28 ENCOUNTER — Ambulatory Visit (HOSPITAL_COMMUNITY)
Admission: RE | Admit: 2018-11-28 | Discharge: 2018-11-28 | Disposition: A | Discharge status: Home / Self Care | Payer: BC Managed Care – PPO | Source: Ambulatory Visit | Attending: Internal Medicine | Admitting: Internal Medicine

## 2018-11-28 ENCOUNTER — Ambulatory Visit: Payer: BC Managed Care – PPO | Admitting: Internal Medicine

## 2018-11-28 ENCOUNTER — Other Ambulatory Visit: Payer: Self-pay

## 2018-11-28 ENCOUNTER — Encounter: Payer: BC Managed Care – PPO | Admitting: Internal Medicine

## 2018-11-28 ENCOUNTER — Encounter: Payer: Self-pay | Admitting: Internal Medicine

## 2018-11-28 VITALS — BP 116/66 | HR 80 | Temp 98.1°F | Ht 67.0 in | Wt 133.8 lb

## 2018-11-28 DIAGNOSIS — J432 Centrilobular emphysema: Secondary | ICD-10-CM

## 2018-11-28 DIAGNOSIS — M25531 Pain in right wrist: Secondary | ICD-10-CM

## 2018-11-28 DIAGNOSIS — R748 Abnormal levels of other serum enzymes: Secondary | ICD-10-CM | POA: Diagnosis not present

## 2018-11-28 DIAGNOSIS — Z23 Encounter for immunization: Secondary | ICD-10-CM

## 2018-11-28 DIAGNOSIS — R918 Other nonspecific abnormal finding of lung field: Secondary | ICD-10-CM

## 2018-11-28 DIAGNOSIS — F17210 Nicotine dependence, cigarettes, uncomplicated: Secondary | ICD-10-CM

## 2018-11-28 DIAGNOSIS — R300 Dysuria: Secondary | ICD-10-CM | POA: Diagnosis not present

## 2018-11-28 DIAGNOSIS — Z79899 Other long term (current) drug therapy: Secondary | ICD-10-CM

## 2018-11-28 DIAGNOSIS — Z720 Tobacco use: Secondary | ICD-10-CM

## 2018-11-28 DIAGNOSIS — M19031 Primary osteoarthritis, right wrist: Secondary | ICD-10-CM | POA: Diagnosis not present

## 2018-11-28 DIAGNOSIS — R569 Unspecified convulsions: Secondary | ICD-10-CM

## 2018-11-28 DIAGNOSIS — M879 Osteonecrosis, unspecified: Secondary | ICD-10-CM

## 2018-11-28 MED ORDER — TAMSULOSIN HCL 0.4 MG PO CAPS: 0.4000 mg | ORAL_CAPSULE | Freq: Every day | ORAL | 0 refills | Status: DC

## 2018-11-28 NOTE — Assessment & Plan Note (Signed)
Patient states that he has been having few month history of difficulty urinating. He states that he is having a weak urinary stream. Urinalysis from June 2020 without proteinuria or rbc in urine.   Assessment and plan  Patient may have bph. Will treat empirically with tamsulosin 0.4mg  qd. Follow up in 4 weeks. May need ultrasound to get pvr and urine flow rate if does not alleviate symptoms.

## 2018-11-28 NOTE — Progress Notes (Signed)
   CC: Right wrist pain  HPI:  [name] is a 58 y.o. with centrilobular emphysema, pulmonary lung nodules, avascular necrosis of right hip, tobacco use disorder who presents for follow up. Please see problem based charting for evaluation, assessment, and plan.  Past Medical History:  Diagnosis Date  . Alcohol abuse    Hx of , stopped in 2000. Had 12 pack for 25 years.   . Arthritis    S/P RIGHT TOTAL HIP ARTHROPLASTY 2007 --PT HAS NOTICED PAIN RIGHT LEG--IS TOLD HE NEEDS RT HIP REVISION  . Avascular necrosis of bone of hip ([place]) 2007   Right  . Chronic chest pain    Normal stress test in 2003  . History of tobacco abuse   . Pleural plaque    MULTIPLE BILATERAL PLEURAL PLAQUES, which had not changed on CT as of 1/04  . Seizure disorder ([place])    Started at age 73. Last EMR record 05/25/09  . Seizures ([place])    PT THINKS GRAND MAL  - LAST TIME PT THINKS WAS 2011   Review of Systems:    Review of Systems  Constitutional: Negative for chills and fever.  Respiratory: Positive for cough. Negative for shortness of breath.   Cardiovascular: Negative for chest pain.  Gastrointestinal: Negative for abdominal pain, nausea and vomiting.  Neurological: Negative for dizziness and headaches.   Physical Exam:  Today's Vitals   11/28/18 1312 11/28/18 1332  BP: 116/66   Pulse: 80   Temp: 98.1 F (36.7 C)   TempSrc: Oral   SpO2: 99%   Weight: 133 lb 12.8 oz (60.7 kg)   Height: 5\' 7"  (1.702 m)   PainSc:  6    Body mass index is 20.96 kg/m.  Physical Exam  Constitutional: He is oriented to person, place, and time. He appears well-developed and well-nourished. No distress.  HENT:  Head: Normocephalic and atraumatic.  Eyes: Conjunctivae are normal.  Cardiovascular: Normal rate, regular rhythm and normal heart sounds.  Respiratory: Effort normal and breath sounds normal. No respiratory distress. He has no wheezes.  GI: Soft. Bowel sounds are normal. He exhibits no  distension. There is no abdominal tenderness.  Musculoskeletal:        General: Tenderness (on palpation of the volar aspect of right wrist ) present. No edema.     Comments: Tenderness with passive and active motion of right wrist. No warmth to touch or swelling of right wrist.   Neurological: He is alert and oriented to person, place, and time. No cranial nerve deficit.  Skin: He is not diaphoretic. No erythema.  Psychiatric: He has a normal mood and affect. His behavior is normal. Judgment and thought content normal.   Assessment & Plan:   See Encounters Tab for problem based charting.  Patient discussed with Dr. Lynnae January

## 2018-11-28 NOTE — Patient Instructions (Signed)
It was a pleasure to see you today [name]. Please make the following changes:  Please make sure you continue to take phenytoin  Please get dexa scan done to assess your bone density   Please get right wrist xray done  If you have any questions or concerns, please call our clinic at [phone] between 9am-5pm and after hours call [phone] and ask for the internal medicine resident on call. If you feel you are having a medical emergency please call 911.   Thank you, we look forward to help you remain healthy!  Lars Mage, MD Internal Medicine PGY3

## 2018-11-28 NOTE — Assessment & Plan Note (Signed)
Patient was seen by Dr. Rexene Alberts neurology in July 2020. She noted a subtherapeutic dilantin level and counseled the patient on taking dilantin regularly without missing doses. DEXA scan was ordered but still has not been completed due to dilantin decreasing bone density.   Assessment and plan  The patient was recommended to continue being on dilantin. Per AED withdrawal calculator he has a 80% chance of 5 year seizure recurrence risk.   -continue dilantin 300mg  qd  -follow up with neurology in 6 months (December 2020) -DEXA scan

## 2018-11-28 NOTE — Assessment & Plan Note (Signed)
Patient states that he has been having right wrist pain for the past few months. Per EMR the patient has reported such symptoms 1 year ago as well. Patient describes the pain as intermittent, sharp in nature, 6/10 intensity, nonradiating, aggravated at nighttime, alleviated with wrist splint. 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 Denies numbness or tingling. He does not recall any trauma to the wrist.  Assessment and plan The patient has had chronic localized pain in his wrist. Will order xray to assess for any fracture (hook of hamate), dislocation, possible gout, or osteoarthritis. Less likely to be carpal tunnel as he does not have any neuropathy.   -right wrist xray

## 2018-11-28 NOTE — Assessment & Plan Note (Signed)
Patient still smoking 1/2 ppd. Stated that he will start using patches that was prescribed.

## 2018-11-28 NOTE — Assessment & Plan Note (Signed)
Per CT chest done in 2016 the patient has stable pulmonary nodules and pleural plaques. Due to stable nodules over 3 yr period, no follow up was recommended.

## 2018-11-29 LAB — GAMMA GT: GGT: 75 IU/L — ABNORMAL HIGH (ref 0–65)

## 2018-11-29 NOTE — Progress Notes (Signed)
Internal Medicine Clinic Attending  Case discussed with Dr. Maricela Bo at the time of the visit.  We reviewed the resident's history and exam and pertinent patient test results.  I agree with the assessment, diagnosis, and plan of care documented in the resident's note.  Dilantin has many side effects and I would encourage working with neurology to find an alternative antiepileptic with fewer side effects.

## 2018-12-12 ENCOUNTER — Ambulatory Visit: Payer: BC Managed Care – PPO | Admitting: Internal Medicine

## 2018-12-12 ENCOUNTER — Encounter: Payer: Self-pay | Admitting: Internal Medicine

## 2018-12-12 ENCOUNTER — Other Ambulatory Visit: Payer: Self-pay

## 2018-12-12 VITALS — BP 125/65 | HR 68 | Temp 98.2°F | Wt 133.7 lb

## 2018-12-12 DIAGNOSIS — N400 Enlarged prostate without lower urinary tract symptoms: Secondary | ICD-10-CM

## 2018-12-12 DIAGNOSIS — M25531 Pain in right wrist: Secondary | ICD-10-CM

## 2018-12-12 DIAGNOSIS — R3912 Poor urinary stream: Secondary | ICD-10-CM

## 2018-12-12 DIAGNOSIS — N401 Enlarged prostate with lower urinary tract symptoms: Secondary | ICD-10-CM | POA: Diagnosis not present

## 2018-12-12 DIAGNOSIS — R21 Rash and other nonspecific skin eruption: Secondary | ICD-10-CM

## 2018-12-12 DIAGNOSIS — R350 Frequency of micturition: Secondary | ICD-10-CM

## 2018-12-12 DIAGNOSIS — Z79899 Other long term (current) drug therapy: Secondary | ICD-10-CM

## 2018-12-12 NOTE — Progress Notes (Signed)
Internal Medicine Clinic Attending  Case discussed with Dr. Melvin  at the time of the visit.  We reviewed the resident's history and exam and pertinent patient test results.  I agree with the assessment, diagnosis, and plan of care documented in the resident's note.  

## 2018-12-12 NOTE — Patient Instructions (Addendum)
Thank you for allowing [name] to care for you  For your urinary changes - This is due to an enlarged prostate - Start Tamsulosin, this should be available at the pharmacy - Ultrasound today showed your prostate is large - We will check lab work today, you will be contacted with the results  For your wrist pain - Continue Iboprofen - Your PCP may have further recommendations  Follow up with PCP in about 1 month

## 2018-12-12 NOTE — Progress Notes (Signed)
   CC: Urinary Frequency   HPI:  [name] is a 58 y.o. M with PMHx listed below presenting for Urinary Frequency. Please see the A&P for the status of the patient's chronic medical problems.  Past Medical History:  Diagnosis Date  . Alcohol abuse    Hx of , stopped in 2000. Had 12 pack for 25 years.   . Arthritis    S/P RIGHT TOTAL HIP ARTHROPLASTY 2007 --PT HAS NOTICED PAIN RIGHT LEG--IS TOLD HE NEEDS RT HIP REVISION  . Avascular necrosis of bone of hip ([place]) 2007   Right  . Chronic chest pain    Normal stress test in 2003  . History of tobacco abuse   . Pleural plaque    MULTIPLE BILATERAL PLEURAL PLAQUES, which had not changed on CT as of 1/04  . Seizure disorder ([place])    Started at age 5. Last EMR record 05/25/09  . Seizures ([place])    PT THINKS GRAND MAL  - LAST TIME PT THINKS WAS 2011   Review of Systems:  Performed and all others negative.  Physical Exam:  Vitals:   12/12/18 1416  BP: 125/65  Pulse: 68  Temp: 98.2 F (36.8 C)  TempSrc: Oral  SpO2: 99%  Weight: 133 lb 11.2 oz (60.6 kg)   Physical Exam Constitutional:      General: He is not in acute distress.    Appearance: Normal appearance.     Comments: Right wrist splint in place  Cardiovascular:     Rate and Rhythm: Normal rate and regular rhythm.     Pulses: Normal pulses.     Heart sounds: Normal heart sounds.  Pulmonary:     Effort: Pulmonary effort is normal. No respiratory distress.     Breath sounds: Normal breath sounds.  Abdominal:     General: Bowel sounds are normal. There is no distension.     Palpations: Abdomen is soft.     Tenderness: There is no abdominal tenderness.  Musculoskeletal:        General: No swelling or deformity.  Skin:    General: Skin is warm and dry.     Comments: Mild rash at left antecubital fossa  Neurological:     General: No focal deficit present.     Mental Status: Mental status is at baseline.    Assessment & Plan:   See Encounters Tab for  problem based charting.  Patient discussed with Dr. Dareen Piano

## 2018-12-12 NOTE — Assessment & Plan Note (Addendum)
Patient is following up today for urinary frequency. He has had urinary frequency and weak / intermittent stream as described in previous note. Recent normal U/A and BMP in June/July. He was to be started on empiric Tamsulosin at his last visit but did not pick this up. Prostate visualized on POCUS today measuring approximately 3.4 x 4.4 x 3.5; bladder visualized and did not appear over distended (but no formal measurement was made). Will check PSA and have patient pick up Tamsulosin. - PSA - Tamsulosin 0.4mg  Daily - Follow up with PCP in about 1 month  ADDENDUM: PSA elevated at 5.2. Will refer patient to urology for further evaluation. Patient contacted and updated by phone. - Urology referral. - Tamsulosin re-ordered as patient states they did not have it at the pharmacy

## 2018-12-13 LAB — PSA: Prostate Specific Ag, Serum: 5.2 ng/mL — ABNORMAL HIGH (ref 0.0–4.0)

## 2018-12-13 MED ORDER — TAMSULOSIN HCL 0.4 MG PO CAPS: 0.4000 mg | ORAL_CAPSULE | Freq: Every day | ORAL | 0 refills | Status: DC

## 2018-12-13 NOTE — Addendum Note (Signed)
Addended by: Neva Seat B on: 12/13/2018 11:22 AM   Modules accepted: Orders

## 2019-01-12 ENCOUNTER — Other Ambulatory Visit: Payer: Self-pay | Admitting: Internal Medicine

## 2019-01-12 DIAGNOSIS — R569 Unspecified convulsions: Secondary | ICD-10-CM

## 2019-01-27 NOTE — Progress Notes (Deleted)
   CC: ***  HPI:  Bill [name] is a 58 y.o. with seizure disorder, prostate hypertrophy, tobacco use disorder, and right wrist pain who presents for follow up regarding left foot pain. Please see problem based charting for evaluation, assessment, and plan.  The patient has been having right wrist pain for the past one year. He had a right wrist xray that was done on 11/28/18 which showed "severe joint space narrowing of the radiocarpal and ulnocarpal joint space with subchondral reactive changes in the lunate and ulna" . There was concern fr inflammatory vs crystalline arthropathy.   The patient describes his current pain as ***   Assessment and plan     Patient was evaluated in acc in October for prostate hypertrophy. POCUS showed a 3.4x4.4x3.5 sized bladder and psa was elevated at 5.2. Patient was referred to urology for further follow up.   -Continue tamsulosin 0.4mg  qd  -follow up with urology for further evaluation    Past Medical History:  Diagnosis Date  . Alcohol abuse    Hx of , stopped in 2000. Had 12 pack for 25 years.   . Arthritis    S/P RIGHT TOTAL HIP ARTHROPLASTY 2007 --PT HAS NOTICED PAIN RIGHT LEG--IS TOLD HE NEEDS RT HIP REVISION  . Avascular necrosis of bone of hip ([place]) 2007   Right  . Chronic chest pain    Normal stress test in 2003  . History of tobacco abuse   . Pleural plaque    MULTIPLE BILATERAL PLEURAL PLAQUES, which had not changed on CT as of 1/04  . Seizure disorder ([place])    Started at age 37. Last EMR record 05/25/09  . Seizures ([place])    PT THINKS GRAND MAL  - LAST TIME PT THINKS WAS 2011   Review of Systems:  ***  Physical Exam:  There were no vitals filed for this visit. ***  Assessment & Plan:   See Encounters Tab for problem based charting.  Patient {GC/GE:[id number]::"discussed with","seen with"} Dr. {NAMES:[id number]::"Butcher","Guilloud","Hoffman","Mullen","Narendra","Raines","Vincent"}

## 2019-01-30 ENCOUNTER — Encounter: Payer: BC Managed Care – PPO | Admitting: Internal Medicine

## 2019-02-03 NOTE — Progress Notes (Signed)
   CC: Follow up for left foot pain  HPI:  [name] is a 58 y.o. male  with seizure disorder, prostate hypertrophy, tobacco use disorder, and right wrist pain who presents for follow up regarding left foot pain. Please see problem based charting for evaluation, assessment, and plan.  Past Medical History:  Diagnosis Date  . Alcohol abuse    Hx of , stopped in 2000. Had 12 pack for 25 years.   . Arthritis    S/P RIGHT TOTAL HIP ARTHROPLASTY 2007 --PT HAS NOTICED PAIN RIGHT LEG--IS TOLD HE NEEDS RT HIP REVISION  . Avascular necrosis of bone of hip ([place]) 2007   Right  . Chronic chest pain    Normal stress test in 2003  . History of tobacco abuse   . Pleural plaque    MULTIPLE BILATERAL PLEURAL PLAQUES, which had not changed on CT as of 1/04  . Seizure disorder ([place])    Started at age 42. Last EMR record 05/25/09  . Seizures ([place])    PT THINKS GRAND MAL  - LAST TIME PT THINKS WAS 2011   Review of Systems:    Denies any sob, chest pain, dizziness. No numbness and tingling. Has joint pain  Physical Exam:  Vitals:   02/06/19 1551  BP: 123/63  Pulse: 67  Temp: 98.2 F (36.8 C)  TempSrc: Oral  SpO2: 97%  Weight: 135 lb 9.6 oz (61.5 kg)  Height: 5\' 7"  (1.702 m)   Physical Exam  Constitutional: He appears well-developed and well-nourished. No distress.  HENT:  Head: Normocephalic and atraumatic.  Eyes: Conjunctivae are normal.  Cardiovascular: Normal rate, regular rhythm and normal heart sounds.  Respiratory: Effort normal and breath sounds normal. No respiratory distress. He has no wheezes.  GI: Soft. Bowel sounds are normal. He exhibits no distension. There is no abdominal tenderness.  Musculoskeletal:        General: No edema.     Comments: 5/5 muscle strength in biceps bilaterally, 3/5 handgrip bilaterally.  No tenderness present to palpation of right shoulder.   Right wrist is without inflammation, erythema, and effusion.  No right ankle tenderness. Good  ROM.  Neurological: He is alert.  Skin: He is not diaphoretic.  Psychiatric: He has a normal mood and affect. His behavior is normal. Judgment and thought content normal.     Assessment & Plan:   See Encounters Tab for problem based charting.  Patient discussed with Dr. Philipp Ovens

## 2019-02-06 ENCOUNTER — Other Ambulatory Visit: Payer: Self-pay

## 2019-02-06 ENCOUNTER — Ambulatory Visit: Payer: BC Managed Care – PPO | Admitting: Internal Medicine

## 2019-02-06 ENCOUNTER — Encounter: Payer: Self-pay | Admitting: Internal Medicine

## 2019-02-06 VITALS — BP 123/63 | HR 67 | Temp 98.2°F | Ht 67.0 in | Wt 135.6 lb

## 2019-02-06 DIAGNOSIS — M2550 Pain in unspecified joint: Secondary | ICD-10-CM

## 2019-02-06 DIAGNOSIS — N400 Enlarged prostate without lower urinary tract symptoms: Secondary | ICD-10-CM

## 2019-02-06 DIAGNOSIS — M25531 Pain in right wrist: Secondary | ICD-10-CM | POA: Diagnosis not present

## 2019-02-06 DIAGNOSIS — Z720 Tobacco use: Secondary | ICD-10-CM

## 2019-02-06 DIAGNOSIS — M79672 Pain in left foot: Secondary | ICD-10-CM | POA: Diagnosis not present

## 2019-02-06 DIAGNOSIS — G40909 Epilepsy, unspecified, not intractable, without status epilepticus: Secondary | ICD-10-CM | POA: Diagnosis not present

## 2019-02-06 DIAGNOSIS — M25511 Pain in right shoulder: Secondary | ICD-10-CM

## 2019-02-06 DIAGNOSIS — F17210 Nicotine dependence, cigarettes, uncomplicated: Secondary | ICD-10-CM

## 2019-02-06 DIAGNOSIS — Z79899 Other long term (current) drug therapy: Secondary | ICD-10-CM

## 2019-02-06 NOTE — Patient Instructions (Signed)
It was a pleasure to see you today [name]. Please make the following changes:  I have collected some bloodwork to evaluate cause of your multiple joint pains.   I have referred you to urology to work up why your prostate serum antigen level is elevated   Follow up in 3 months  If you have any questions or concerns, please call our clinic at [phone] between 9am-5pm and after hours call [phone] and ask for the internal medicine resident on call. If you feel you are having a medical emergency please call 911.   Thank you, we look forward to help you remain healthy!  Lars Mage, MD Internal Medicine PGY3

## 2019-02-07 ENCOUNTER — Other Ambulatory Visit: Payer: Self-pay | Admitting: Internal Medicine

## 2019-02-07 DIAGNOSIS — N400 Enlarged prostate without lower urinary tract symptoms: Secondary | ICD-10-CM

## 2019-02-07 LAB — SEDIMENTATION RATE: Sed Rate: 93 mm/hr — ABNORMAL HIGH (ref 0–30)

## 2019-02-07 LAB — RHEUMATOID FACTOR: Rheumatoid fact SerPl-aCnc: >650 IU/mL — ABNORMAL HIGH (ref 0.0–13.9)

## 2019-02-07 LAB — ANTINUCLEAR ANTIBODIES, IFA: ANA Titer 1: NEGATIVE

## 2019-02-07 LAB — C-REACTIVE PROTEIN: CRP: 15 mg/L — ABNORMAL HIGH (ref 0–10)

## 2019-02-08 DIAGNOSIS — M2550 Pain in unspecified joint: Secondary | ICD-10-CM | POA: Insufficient documentation

## 2019-02-08 DIAGNOSIS — M069 Rheumatoid arthritis, unspecified: Secondary | ICD-10-CM | POA: Insufficient documentation

## 2019-02-08 NOTE — Addendum Note (Signed)
Addended by: Lars Mage on: 02/08/2019 11:58 AM   Modules accepted: Orders

## 2019-02-08 NOTE — Assessment & Plan Note (Signed)
Over the past 1 year the patient has has arthralgia in numerous joints. Joint involvement is asymmetric consisting of right shoulder, left ankle, and right wrist. Patient states that he has arthralgia intermittently in different joints. Describes the pain as severe and sharp when present and accompanied with approximately 20 minutes of morning stiffness.   The patient states that his mother had some sort of arthritis, but it is unknown what type she had.   He had a right wrist xray that was done on 11/28/18 which showed "severe joint space narrowing of the radiocarpal and ulnocarpal joint space with subchondral reactive changes in the lunate and ulna" . There was concern for inflammatory vs crystalline arthropathy.   Assessment and plan  Due to multiple joint involvement in a patient that is 82 yrs of age and normal bmi it is less concerning for osteoarthritis. Will evaluate for inflammatory conditions with esr, crp, rheumatoid factor, and ana.

## 2019-02-08 NOTE — Assessment & Plan Note (Signed)
Patient continues to smoke 1/2-1ppd. He is motivated to quit but does not want to use chantix and cannot use bupropion due to his seizure disorder. He stated that he will try the nicotine patches that have already been prescribed for him.   -will continue to follow

## 2019-02-08 NOTE — Assessment & Plan Note (Signed)
Patient was evaluated in acc in October for prostate hypertrophy. POCUS showed a 3.4x4.4x3.5 sized bladder and psa was elevated at 5.2. Patient was referred to urology for further follow up with regards to elevated psa.   -Continue tamsulosin 0.4mg  qd  -follow up with urology for further evaluation

## 2019-02-08 NOTE — Progress Notes (Signed)
Internal Medicine Clinic Attending  Case discussed with Dr. Chundi at the time of the visit.  We reviewed the resident's history and exam and pertinent patient test results.  I agree with the assessment, diagnosis, and plan of care documented in the resident's note. 

## 2019-03-14 ENCOUNTER — Other Ambulatory Visit: Payer: Self-pay | Admitting: Internal Medicine

## 2019-03-14 DIAGNOSIS — N400 Enlarged prostate without lower urinary tract symptoms: Secondary | ICD-10-CM

## 2019-04-04 ENCOUNTER — Ambulatory Visit: Payer: BC Managed Care – PPO | Admitting: Neurology

## 2019-04-05 ENCOUNTER — Ambulatory Visit: Payer: Self-pay | Admitting: Neurology

## 2019-04-05 ENCOUNTER — Telehealth: Payer: Self-pay

## 2019-04-05 NOTE — Telephone Encounter (Signed)
Patient no showed 04/05/2019 visit with Dr. Rexene Alberts.

## 2019-04-10 ENCOUNTER — Encounter: Payer: Self-pay | Admitting: Neurology

## 2019-04-18 ENCOUNTER — Other Ambulatory Visit: Payer: Self-pay | Admitting: Internal Medicine

## 2019-04-18 DIAGNOSIS — N400 Enlarged prostate without lower urinary tract symptoms: Secondary | ICD-10-CM

## 2019-05-17 ENCOUNTER — Other Ambulatory Visit: Payer: Self-pay | Admitting: Internal Medicine

## 2019-05-17 DIAGNOSIS — N400 Enlarged prostate without lower urinary tract symptoms: Secondary | ICD-10-CM

## 2019-06-06 ENCOUNTER — Other Ambulatory Visit: Payer: Self-pay

## 2019-06-06 ENCOUNTER — Ambulatory Visit (INDEPENDENT_AMBULATORY_CARE_PROVIDER_SITE_OTHER): Payer: 59 | Admitting: Internal Medicine

## 2019-06-06 DIAGNOSIS — R3911 Hesitancy of micturition: Secondary | ICD-10-CM | POA: Diagnosis not present

## 2019-06-06 DIAGNOSIS — N401 Enlarged prostate with lower urinary tract symptoms: Secondary | ICD-10-CM

## 2019-06-06 DIAGNOSIS — Z79899 Other long term (current) drug therapy: Secondary | ICD-10-CM

## 2019-06-06 DIAGNOSIS — N400 Enlarged prostate without lower urinary tract symptoms: Secondary | ICD-10-CM

## 2019-06-06 MED ORDER — TAMSULOSIN HCL 0.4 MG PO CAPS: ORAL_CAPSULE | ORAL | 5 refills | Status: DC

## 2019-06-06 NOTE — Progress Notes (Signed)
Internal Medicine Clinic Attending  Case discussed with Dr. Harbrecht at the time of the visit.  We reviewed the resident's history and exam and pertinent patient test results.  I agree with the assessment, diagnosis, and plan of care documented in the resident's note.   

## 2019-06-06 NOTE — Assessment & Plan Note (Signed)
  Prostatic hypertrophy: The patient states today that he was contacted by [facility] urology yesterday after scheduling the appointment with [name].  He had undergone a prostate biopsy on March 19.  He reports being informed that he had a "mild form of prostate cancer."  I am unable to find information regarding this in our system or the file room today.  I believe this discussion is best to occur between the urologist and the patient is able be able to determine most appropriate therapy as well.  For now, we will continue tamsulosin as he has significant benefit from this with regard to his urinary hesitancy and pelvic pain.  We discussed the complexity of obtaining a diagnosis of cancer and determining most appropriate therapy.  The patient endorsed hematuria and pelvic pain following the biopsy which resolved by day 3.  He continues to have intermittent pelvic pain with urination which responds well to tamsulosin.  He denied fever, chills or other concerning symptoms following the biopsy.  Plan: Continue tamsulosin with refill provided Follow-up with [facility] on April 7

## 2019-06-06 NOTE — Patient Instructions (Signed)
FOLLOW-UP INSTRUCTIONS When: With your PCP in 2-3 months For: routine appointments What to bring: All of your medications  Today we discussed your prostate enlargement. I am glad to hear that you are following with urology but am sorry that you have prostate cancer. Please keep your appointment with them on April 7th.  Thank you for your visit to the [facility] today. If you have any questions or concerns please call us at [phone].

## 2019-06-06 NOTE — Progress Notes (Signed)
   CC: Prostate hypertrophy  HPI:[name] is a 59 y.o. male who presents for evaluation of prostate hypertrophy. Please see individual problem based A/P for details.  Past Medical History:  Diagnosis Date  . Alcohol abuse    Hx of , stopped in 2000. Had 12 pack for 25 years.   . Arthritis    S/P RIGHT TOTAL HIP ARTHROPLASTY 2007 --PT HAS NOTICED PAIN RIGHT LEG--IS TOLD HE NEEDS RT HIP REVISION  . Avascular necrosis of bone of hip ([place]) 2007   Right  . Chronic chest pain    Normal stress test in 2003  . History of tobacco abuse   . Pleural plaque    MULTIPLE BILATERAL PLEURAL PLAQUES, which had not changed on CT as of 1/04  . Seizure disorder ([place])    Started at age 32. Last EMR record 05/25/09  . Seizures ([place])    PT THINKS GRAND MAL  - LAST TIME PT THINKS WAS 2011   Review of Systems:  ROS negative except as per HPI.  Physical Exam: Vitals:   06/06/19 0851  BP: 106/64  Pulse: 76  Temp: 98.3 F (36.8 C)  TempSrc: Oral  SpO2: 99%  Weight: 130 lb 14.4 oz (59.4 kg)  Height: 5\' 7"  (1.702 m)   Filed Weights   06/06/19 0851  Weight: 130 lb 14.4 oz (59.4 kg)   General: A/O x4, in no acute distress, afebrile, nondiaphoretic HEENT: PEERL, EMO intact Cardio: RRR, no mrg's  Pulmonary: CTA bilaterally, no wheezing or crackles  Psych: Appropriate affect, not depressed in appearance, engages well  Assessment & Plan:   See Encounters Tab for problem based charting.  Patient discussed with Dr. Lynnae January

## 2019-06-10 ENCOUNTER — Ambulatory Visit: Admitting: Emergency Medicine

## 2019-06-10 ENCOUNTER — Emergency Department
Admit: 2019-06-10 | Discharge: 2019-06-10 | Disposition: A | Discharge status: Home / Self Care | Payer: No Typology Code available for payment source
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
## 2019-08-23 ENCOUNTER — Ambulatory Visit (INDEPENDENT_AMBULATORY_CARE_PROVIDER_SITE_OTHER): Payer: 59 | Admitting: Internal Medicine

## 2019-08-23 ENCOUNTER — Encounter: Payer: Self-pay | Admitting: Internal Medicine

## 2019-08-23 ENCOUNTER — Other Ambulatory Visit: Payer: Self-pay

## 2019-08-23 VITALS — BP 110/66 | HR 62 | Temp 98.7°F | Ht 67.0 in | Wt 131.8 lb

## 2019-08-23 DIAGNOSIS — M05731 Rheumatoid arthritis with rheumatoid factor of right wrist without organ or systems involvement: Secondary | ICD-10-CM

## 2019-08-23 DIAGNOSIS — C61 Malignant neoplasm of prostate: Secondary | ICD-10-CM

## 2019-08-23 DIAGNOSIS — R748 Abnormal levels of other serum enzymes: Secondary | ICD-10-CM | POA: Diagnosis not present

## 2019-08-23 NOTE — Patient Instructions (Signed)
It was a pleasure to see you today Mr. Tillery. Please make the following changes:  -pick up your prescriptions from [facility] for methotrexate and prednisone  -follow up with rheumatology  -will get bloodwork to follow up on a liver enzyme elevation   If you have any questions or concerns, please call our clinic at [phone] between 9am-5pm and after hours call [phone] and ask for the internal medicine resident on call. If you feel you are having a medical emergency please call 911.   Thank you, we look forward to help you remain healthy!  Lars Mage, MD Internal Medicine PGY3   To schedule an appointment for a COVID vaccine or be added to the vaccine wait list: Go to [email or web address]   OR Go to [email or web address]                  OR Call [phone]                                     OR Call [phone] and select Option 2  [place] ([place]) [place] [place], [place] [address] Hours: Mon,Thu 8-5, Sat 8-12 Type: [place] (12+)  [place] [facility] [place]  ([place]) [place] [employer] [place], [place] [address] Hours: Thu: 1-5 Type: Pfizer (12+)

## 2019-08-23 NOTE — Assessment & Plan Note (Signed)
Patient with elevated ALP of 164 and mildly elevated GGT of 75. No hyperbilirubinemia. No recent abdominal pain. Patient quit alcohol 30 years ago. Does not drink at all now  No abdominal pain, change in color of urine, pruritus.  Assessment and plan  Due to patient being asymptomatic and with only a slightly elevated ggt, will repeat lab. If still elevated he should get a right upper quadrant ultrasound for evaluation of the bilary system.

## 2019-08-23 NOTE — Progress Notes (Signed)
    CC: Rheumatoid arthritis  HPI:  Bill [name] is a 59 y.o. male with centrilobular emphysema, seizure disorder, rheumatoid arthritis, hx of alcohol use disorder who presents for follow up. Please see problem based charting for evaluation, assessment, and plan.  Past Medical History:  Diagnosis Date  . Alcohol abuse    Hx of , stopped in 2000. Had 12 pack for 25 years.   . Arthritis    S/P RIGHT TOTAL HIP ARTHROPLASTY 2007 --PT HAS NOTICED PAIN RIGHT LEG--IS TOLD HE NEEDS RT HIP REVISION  . Avascular necrosis of bone of hip ([place]) 2007   Right  . Chronic chest pain    Normal stress test in 2003  . History of tobacco abuse   . Pleural plaque    MULTIPLE BILATERAL PLEURAL PLAQUES, which had not changed on CT as of 1/04  . Seizure disorder ([place])    Started at age 64. Last EMR record 05/25/09  . Seizures ([place])    PT THINKS GRAND MAL  - LAST TIME PT THINKS WAS 2011   Review of Systems:    Denies nausea, vomiting  Physical Exam:  Vitals:   08/23/19 1114  BP: 110/66  Pulse: 62  Temp: 98.7 F (37.1 C)  TempSrc: Oral  SpO2: 100%  Weight: 131 lb 12.8 oz (59.8 kg)  Height: 5\' 7"  (1.702 m)   Physical Exam  Constitutional: He is oriented to person, place, and time.  HENT:  Head: Normocephalic and atraumatic.  Cardiovascular: Normal rate, regular rhythm, normal heart sounds and normal pulses.  Respiratory: Effort normal and breath sounds normal.  GI: Soft. Normal appearance and bowel sounds are normal.  Musculoskeletal:        General: Swelling (in bilateral metacarpal joints, pip, right elbow) present. Normal range of motion.  Neurological: He is alert and oriented to person, place, and time.  Psychiatric: His behavior is normal. Mood, judgment and thought content normal.   Assessment & Plan:   See Encounters Tab for problem based charting.  Patient discussed with Dr. Philipp Ovens

## 2019-08-23 NOTE — Assessment & Plan Note (Signed)
Patient was referred to rheumatology for assymetric polyarthralgia, morning stiffness, elevated rf esr and crp for possible rheumatoid arthritis.Rheumatology diagnosed patient with rheumatoid arthritis involving several sites. He has been started on methotrexate 2.97m 6 tablets once weekly along with folic acid 18m and prednisone burst.   Patient states that methotrexate and steroid was never sent to him. Called pharmacy and both methotrexate #24 and prednisone #70 were picked up on 04/17/19.  He has only got folic.  Patient states that the previous pain has decreased somewhat.  Assessment and plan  Reminded patient to make sure he takes methotrexate. He has refills available to him if needed. Patient is to follow up with rheumatology next week.

## 2019-08-23 NOTE — Assessment & Plan Note (Signed)
Patient was seen by [facility] in May 2021 and diagnosed with low volume low grade prostate cancer. Urology discussed options of active surveillane vs surgical intervention. Patient and family chose to pursue active surveillance.   Assessment and plan  The patient is to follow up with urology August 2021 for dre/repeat psa and mri with repeat biopsy in November 2021.

## 2019-08-24 ENCOUNTER — Encounter: Payer: Self-pay | Admitting: *Deleted

## 2019-08-24 LAB — GAMMA GT: GGT: 59 IU/L (ref 0–65)

## 2019-08-24 NOTE — Progress Notes (Signed)
Internal Medicine Clinic Attending  Case discussed with Dr. Chundi at the time of the visit.  We reviewed the resident's history and exam and pertinent patient test results.  I agree with the assessment, diagnosis, and plan of care documented in the resident's note. 

## 2019-09-11 ENCOUNTER — Other Ambulatory Visit: Payer: Self-pay | Admitting: Internal Medicine

## 2019-09-11 DIAGNOSIS — R569 Unspecified convulsions: Secondary | ICD-10-CM

## 2019-09-12 ENCOUNTER — Other Ambulatory Visit: Payer: Self-pay | Admitting: Internal Medicine

## 2019-09-12 DIAGNOSIS — R569 Unspecified convulsions: Secondary | ICD-10-CM

## 2019-10-04 ENCOUNTER — Other Ambulatory Visit: Payer: Self-pay

## 2019-10-04 ENCOUNTER — Ambulatory Visit (INDEPENDENT_AMBULATORY_CARE_PROVIDER_SITE_OTHER): Payer: 59 | Admitting: Student

## 2019-10-04 ENCOUNTER — Encounter: Payer: Self-pay | Admitting: Student

## 2019-10-04 DIAGNOSIS — M05731 Rheumatoid arthritis with rheumatoid factor of right wrist without organ or systems involvement: Secondary | ICD-10-CM | POA: Diagnosis not present

## 2019-10-04 DIAGNOSIS — C61 Malignant neoplasm of prostate: Secondary | ICD-10-CM | POA: Diagnosis not present

## 2019-10-04 DIAGNOSIS — N400 Enlarged prostate without lower urinary tract symptoms: Secondary | ICD-10-CM

## 2019-10-04 MED ORDER — TAMSULOSIN HCL 0.4 MG PO CAPS: 0.4000 mg | ORAL_CAPSULE | Freq: Two times a day (BID) | ORAL | 3 refills | Status: DC

## 2019-10-04 NOTE — Progress Notes (Signed)
   CC: Follow up   HPI:  [name] is a 59 y.o. male with history documented below presents to follow up. Please refer to problem based charting for further details and assessment and plan of current problem and chronic medical conditions.  Past Medical History:  Diagnosis Date  . Alcohol abuse    Hx of , stopped in 2000. Had 12 pack for 25 years.   . Arthritis    S/P RIGHT TOTAL HIP ARTHROPLASTY 2007 --PT HAS NOTICED PAIN RIGHT LEG--IS TOLD HE NEEDS RT HIP REVISION  . Avascular necrosis of bone of hip ([place]) 2007   Right  . Chronic chest pain    Normal stress test in 2003  . History of tobacco abuse   . Pleural plaque    MULTIPLE BILATERAL PLEURAL PLAQUES, which had not changed on CT as of 1/04  . Seizure disorder ([place])    Started at age 58. Last EMR record 05/25/09  . Seizures ([place])    PT THINKS GRAND MAL  - LAST TIME PT THINKS WAS 2011   Review of Systems: Negative as per HPI  Physical Exam:  Vitals:   10/04/19 0952  BP: (!) 105/54  Pulse: 64  Temp: 98.2 F (36.8 C)  TempSrc: Oral  SpO2: 99%  Weight: 131 lb 14.4 oz (59.8 kg)  Height: 5\' 7"  (1.702 m)   Physical Exam  Constitutional: Appears well-developed and well-nourished. No distress.  HENT:  Head: Normocephalic and atraumatic.  Eyes: Conjunctivae are normal.  Cardiovascular: Normal rate, regular rhythm and normal heart sounds.  Respiratory: Effort normal and breath sounds normal. No respiratory distress. No wheezes.  GI: Soft. Bowel sounds are normal. No distension. There is no tenderness.  Musculoskeletal: No sinovitis, good ROM of bilateral hands Neurological: Is alert. 5/5 grip strength bilaterally Skin: Not diaphoretic. No erythema.  Psychiatric: Normal mood and affect. Behavior is normal. Judgment and thought content normal.    Assessment & Plan:   See Encounters Tab for problem based charting.  Patient seen with Dr. Evette Doffing

## 2019-10-04 NOTE — Assessment & Plan Note (Signed)
Patient with urology follow up next week. Notes urinating well in daytime but has significant hesitance at night. He states urologist suggested he could take additional dose of tamsulosin at night it he continues to have difficulty voiding.  Plan  Increase tamsulosin 0.4 mg BID Follow up with urology for repeat PSA

## 2019-10-04 NOTE — Patient Instructions (Addendum)
It was a pleasure seeing you in clinic.   Today I increased your Tamsulosin to twice a day. No labs today please follow up with your rheumatologist and urologist.   If you have any questions or concerns, please call our clinic at [phone] between 9am-5pm and after hours call [phone] and ask for the internal medicine resident on call. If you feel you are having a medical emergency please call 911.   Thank you, we look forward to helping you remain healthy!

## 2019-10-04 NOTE — Assessment & Plan Note (Signed)
Currently taking methotrexate 2.5 mg 6 tablets a week with good improvement of joint pain. Has not been taking folate because he was hesitant to take more medication. Explained the importance of regularly taking folate while on methotrexate.   Plan Continue methotrexate 2.5 6 tabs on Wednesdays Folic acid 1 mg daily Follow up with rheumatology CBC, CMP next visit

## 2019-10-05 NOTE — Progress Notes (Signed)
Internal Medicine Clinic Attending  I saw and evaluated the patient.  I personally confirmed the key portions of the history and exam documented by Dr. Liang and I reviewed pertinent patient test results.  The assessment, diagnosis, and plan were formulated together and I agree with the documentation in the resident's note.  

## 2019-12-03 ENCOUNTER — Encounter: Payer: Self-pay | Admitting: Gastroenterology

## 2020-02-07 ENCOUNTER — Other Ambulatory Visit (HOSPITAL_COMMUNITY): Payer: Self-pay | Admitting: Urology

## 2020-02-07 ENCOUNTER — Other Ambulatory Visit: Payer: Self-pay | Admitting: Urology

## 2020-02-07 DIAGNOSIS — C61 Malignant neoplasm of prostate: Secondary | ICD-10-CM

## 2020-02-14 ENCOUNTER — Other Ambulatory Visit: Payer: Self-pay

## 2020-02-14 ENCOUNTER — Ambulatory Visit (HOSPITAL_COMMUNITY)
Admission: RE | Admit: 2020-02-14 | Discharge: 2020-02-14 | Disposition: A | Discharge status: Home / Self Care | Payer: 59 | Source: Ambulatory Visit | Attending: Urology | Admitting: Urology

## 2020-02-14 DIAGNOSIS — C61 Malignant neoplasm of prostate: Secondary | ICD-10-CM | POA: Diagnosis not present

## 2020-02-14 MED ORDER — GADOBUTROL 1 MMOL/ML IV SOLN
7.5000 mL | Freq: Once | INTRAVENOUS | Status: AC | PRN
  Administered 2020-02-14: 7.5 mL via INTRAVENOUS

## 2020-03-20 ENCOUNTER — Other Ambulatory Visit: Payer: Self-pay | Admitting: Student

## 2020-03-20 DIAGNOSIS — N400 Enlarged prostate without lower urinary tract symptoms: Secondary | ICD-10-CM

## 2020-05-09 ENCOUNTER — Ambulatory Visit (INDEPENDENT_AMBULATORY_CARE_PROVIDER_SITE_OTHER): Payer: 59 | Admitting: Student

## 2020-05-09 ENCOUNTER — Encounter: Payer: Self-pay | Admitting: Student

## 2020-05-09 ENCOUNTER — Encounter: Payer: Self-pay | Admitting: Gastroenterology

## 2020-05-09 VITALS — BP 112/69 | HR 71 | Temp 98.3°F | Wt 142.1 lb

## 2020-05-09 DIAGNOSIS — D126 Benign neoplasm of colon, unspecified: Secondary | ICD-10-CM

## 2020-05-09 DIAGNOSIS — C61 Malignant neoplasm of prostate: Secondary | ICD-10-CM | POA: Diagnosis not present

## 2020-05-09 DIAGNOSIS — M05731 Rheumatoid arthritis with rheumatoid factor of right wrist without organ or systems involvement: Secondary | ICD-10-CM | POA: Diagnosis not present

## 2020-05-09 DIAGNOSIS — Z720 Tobacco use: Secondary | ICD-10-CM

## 2020-05-09 DIAGNOSIS — R569 Unspecified convulsions: Secondary | ICD-10-CM | POA: Diagnosis not present

## 2020-05-09 DIAGNOSIS — Z23 Encounter for immunization: Secondary | ICD-10-CM

## 2020-05-09 MED ORDER — NICOTINE 14 MG/24HR TD PT24: 14.0000 mg | MEDICATED_PATCH | TRANSDERMAL | 0 refills | Status: DC

## 2020-05-09 NOTE — Assessment & Plan Note (Signed)
Flu vaccine today 

## 2020-05-09 NOTE — Patient Instructions (Addendum)
Bill [name],   Thank you for your visit to the [facility] today. It was a pleasure meeting you. Today we discussed the following:  1) Urination issues:  - Please call your urologist to discuss the symptoms you have been having - Please go to the ED if you develop pain and/or are not able to urinate - We will work to get your records from urology  2) Rheumatoid arthritis - Keep your follow-up appointment with your rheumatologist. Be sure to ask if you need to start taking methotrexate and folic acid again.  3) Seizure disorder - Call [facility] at [phone] to schedule a follow-up appointment  4) Smoking - I sent a prescription for nicotine patches to your pharmacy  5) You received your flu shot today  6) You have been scheduled for your colonoscopy. The time and date are on this after visit summary  7) COVID-19 Vaccine: We highly recommend getting vaccinated against COVID-19.  - The vaccines are SAFE.They went through all the required stages of clinical trials, and extensive testing and monitoring have shown that these vaccines are safe and effective. - The vaccines are EFFECTIVE. They lower your chances of getting and spreading the virus. They are proven to reduce your chance of severe illness, hospitalization, and death from COVID-19. - Getting vaccinated protects you, your loved ones, and people at risk for severe illness from COVID-19. - You can get vaccinated at any major pharmacy. If you are interested in setting up an appointment to get vaccinated through [facility], you can call the [place] COVID-19 hotline at [phone].    We would like to see you back in 3 months for follow-up with your PCP. Please bring all of your medications with you.   If you have any questions or concerns, please call our clinic at [phone] between 9am-5pm. Outside of these hours, call [phone] and ask for the internal medicine resident  on call. If you feel you are having a medical emergency please call 911.

## 2020-05-09 NOTE — Assessment & Plan Note (Signed)
On chart review, it appears patient last saw neurology in July 2020.  He continues on Dilantin 300 mg daily.  States he is not sure if he is supposed to be following with neurology.  Plan: - Provided patient with number to [facility] that he can schedule a follow-up appointment

## 2020-05-09 NOTE — Assessment & Plan Note (Addendum)
Patient reports yesterday evening he noticed his urinary stream was weaker than it has been recently.  States he has been taking tamsulosin 0.4 mg once daily as instructed by his urologist.  Reports he saw his urologist last month, and no changes were made.  He denies abdominal pain, dysuria, urinary frequency.  States he has been staying hydrated.  He states that he does not have a history of prostate cancer but rather there was some initial concern for possible cancer and further studies showed no cancer.  A/P: Patient reports a weaker stream over the last day.  Reassuringly, no associated pain, and his urine output is reportedly at baseline.  Unfortunately I cannot see his urology records. - Instructed patient to call his urologist to discuss his symptoms and to present to the emergency department if he is unable to urinate -Had patient sign a release form so that we can have access to his [facility] records   ADDENDUM: on review of patient's 04/30/20 visit with Urology Bill Fanti MD with [facility]), Bill [name] does have a diagnosis of prostate cancer, though it is noted to be low risk prostate cancer. Due to elevated PSA he had a transrectal ultrasound prostate biopsy (TRUSP-Bx) for surveillance biopsy. He was told he would receive a call with the result. Scheduled for 70-month follow-up with PSA.

## 2020-05-09 NOTE — Assessment & Plan Note (Signed)
As per the last note on 08/09/2018, the patient was supposed to have a repeat colonoscopy done by August 2021.  This was scheduled today for nurse visit on 05/19/2020 and procedure on 06/02/2020.

## 2020-05-09 NOTE — Assessment & Plan Note (Signed)
Patient reports he smokes 1/2 to 1 pack/day.  Notes he ran out of his prescription for nicotine patches, and when he was using the patches, he was not smoking.  States he would like a refill of the patches and is hopeful he will be able to quit again.  Plan: - refilled nicotine patches - Discuss progress on smoking cessation at subsequent visits

## 2020-05-09 NOTE — Progress Notes (Signed)
   CC: follow-up, urinary complaint, flu shot, colonoscopy  HPI:  Bill [name] is a 60 y.o. man with history of prostate cancer, rheumatoid arthritis, and as below who presents to clinic for the above. His last clinic visit was on 10/04/19 with Dr. Lisabeth Devoid.   To see the details of this patient's management of their acute and chronic problems, please refer to the Assessment & Plan under the Encounters tab.    Past Medical History:  Diagnosis Date  . Alcohol abuse    Hx of , stopped in 2000. Had 12 pack for 25 years.   . Arthritis    S/P RIGHT TOTAL HIP ARTHROPLASTY 2007 --PT HAS NOTICED PAIN RIGHT LEG--IS TOLD HE NEEDS RT HIP REVISION  . Avascular necrosis of bone of hip ([place]) 2007   Right  . Chronic chest pain    Normal stress test in 2003  . History of tobacco abuse   . Pleural plaque    MULTIPLE BILATERAL PLEURAL PLAQUES, which had not changed on CT as of 1/04  . Seizure disorder ([place])    Started at age 54. Last EMR record 05/25/09  . Seizures ([place])    PT THINKS GRAND MAL  - LAST TIME PT THINKS WAS 2011   Review of Systems:    Review of Systems  Constitutional: Negative for chills and fever.  Respiratory: Negative for shortness of breath.   Cardiovascular: Negative for chest pain.  Genitourinary: Negative for dysuria, frequency, hematuria and urgency.  Musculoskeletal: Negative for joint pain and myalgias.  Skin: Negative for rash.  Neurological: Negative for weakness.    Physical Exam:  Vitals:   05/09/20 0949  BP: 112/69  Pulse: 71  Temp: 98.3 F (36.8 C)  TempSrc: Oral  SpO2: 99%  Weight: 142 lb 1.6 oz (64.5 kg)   Constitutional: well-appearing man sitting in chair, in no acute distress HENT: normocephalic atraumatic, mucous membranes moist Eyes: conjunctiva non-erythematous Neck: supple Cardiovascular: regular rate and rhythm, no m/r/g Pulmonary/Chest: normal work of breathing on room air, lungs clear to auscultation bilaterally Abdominal: soft,  non-tender, non-distended MSK: normal bulk and tone; joints non-tender and without swelling or erythema Neurological: alert & oriented x 3, normal gait Skin: warm and dry Psych: normal mood and affect    Assessment & Plan:   See Encounters Tab for problem based charting.  Patient discussed with Dr. Rebeca Alert

## 2020-05-09 NOTE — Assessment & Plan Note (Addendum)
Patient reports he last saw his rheumatologist about 4 months ago, and is scheduled to see her around 05/14/2020.  States in the meantime his prescription for methotrexate ran out, and when he called the office he was told that his rheumatologist did not want to refill it.  He is unsure of why.  Reports that his rheumatoid arthritis has been well controlled, denies pain in his hands or wrists.  States he has not been taking his folic acid consistently.  A/P: Patient's rheumatoid arthritis appears to be well controlled, however I am not sure if he should or should not be taking the methotrexate.  Per the patient, his rheumatologist did not want to refill his prescription, but he does not know why. - Instructed patient to ensure follow-up with his rheumatologist Leafy Kindle, PA, [facility]) and clarify the need for methotrexate, he reports his next appointment is in a few days - Consider CBC and CMP at next visit if patient taking methotrexate

## 2020-05-13 NOTE — Progress Notes (Signed)
Internal Medicine Clinic Attending  Case discussed with Dr. Watson at the time of the visit.  We reviewed the resident's history and exam and pertinent patient test results.  I agree with the assessment, diagnosis, and plan of care documented in the resident's note.  Alexander Raines, M.D., Ph.D.  

## 2020-05-19 ENCOUNTER — Ambulatory Visit (AMBULATORY_SURGERY_CENTER): Payer: Self-pay | Admitting: *Deleted

## 2020-05-19 ENCOUNTER — Other Ambulatory Visit: Payer: Self-pay

## 2020-05-19 VITALS — Ht 67.0 in | Wt 134.0 lb

## 2020-05-19 DIAGNOSIS — Z86010 Personal history of colonic polyps: Secondary | ICD-10-CM

## 2020-05-19 MED ORDER — NA SULFATE-K SULFATE-MG SULF 17.5-3.13-1.6 GM/177ML PO SOLN: ORAL | 0 refills | Status: DC

## 2020-05-19 NOTE — Progress Notes (Signed)
Patient is here in-person for PV. Patient denies any allergies to eggs or soy. Patient denies any problems with anesthesia/sedation. Patient denies any oxygen use at home. Patient denies taking any diet/weight loss medications or blood thinners. Patient is not being treated for MRSA or C-diff. Patient is aware of our care-partner policy and MMITV-47 safety protocol.

## 2020-06-01 ENCOUNTER — Encounter: Payer: Self-pay | Admitting: Certified Registered Nurse Anesthetist

## 2020-06-02 ENCOUNTER — Ambulatory Visit (AMBULATORY_SURGERY_CENTER): Payer: 59 | Admitting: Gastroenterology

## 2020-06-02 ENCOUNTER — Encounter: Payer: Self-pay | Admitting: Gastroenterology

## 2020-06-02 ENCOUNTER — Other Ambulatory Visit: Payer: Self-pay

## 2020-06-02 VITALS — BP 103/65 | HR 60 | Temp 96.0°F | Resp 16 | Ht 67.0 in | Wt 134.0 lb

## 2020-06-02 DIAGNOSIS — K6389 Other specified diseases of intestine: Secondary | ICD-10-CM

## 2020-06-02 DIAGNOSIS — K639 Disease of intestine, unspecified: Secondary | ICD-10-CM | POA: Diagnosis not present

## 2020-06-02 DIAGNOSIS — Z86010 Personal history of colonic polyps: Secondary | ICD-10-CM

## 2020-06-02 MED ORDER — SODIUM CHLORIDE 0.9 % IV SOLN
500.0000 mL | Freq: Once | INTRAVENOUS | Status: DC
Start: 2020-06-02 — End: 2020-06-02

## 2020-06-02 NOTE — Progress Notes (Signed)
Called to room to assist during endoscopic procedure.  Patient ID and intended procedure confirmed with present staff. Received instructions for my participation in the procedure from the performing physician.  

## 2020-06-02 NOTE — Patient Instructions (Addendum)
Handouts were given to your care partner on diverticulosis and hemorrhoids. You may resume your current medications today. Await biopsy results.  May take 1-3 weeks to receive pathology results. Please call if any questions or concerns.     YOU HAD AN ENDOSCOPIC PROCEDURE TODAY AT [facility]:   Refer to the procedure report that was given to you for any specific questions about what was found during the examination.  If the procedure report does not answer your questions, please call your gastroenterologist to clarify.  If you requested that your care partner not be given the details of your procedure findings, then the procedure report has been included in a sealed envelope for you to review at your convenience later.  YOU SHOULD EXPECT: Some feelings of bloating in the abdomen. Passage of more gas than usual.  Walking can help get rid of the air that was put into your GI tract during the procedure and reduce the bloating. If you had a lower endoscopy (such as a colonoscopy or flexible sigmoidoscopy) you may notice spotting of blood in your stool or on the toilet paper. If you underwent a bowel prep for your procedure, you may not have a normal bowel movement for a few days.  Please Note:  You might notice some irritation and congestion in your nose or some drainage.  This is from the oxygen used during your procedure.  There is no need for concern and it should clear up in a day or so.  SYMPTOMS TO REPORT IMMEDIATELY:   Following lower endoscopy (colonoscopy or flexible sigmoidoscopy):  Excessive amounts of blood in the stool  Significant tenderness or worsening of abdominal pains  Swelling of the abdomen that is new, acute  Fever of 100F or higher   For urgent or emergent issues, a gastroenterologist can be reached at any hour by calling [phone]. Do not use MyChart messaging for urgent concerns.    DIET:  We do recommend a small meal at first, but then you may  proceed to your regular diet.  Drink plenty of fluids but you should avoid alcoholic beverages for 24 hours.  ACTIVITY:  You should plan to take it easy for the rest of today and you should NOT DRIVE or use heavy machinery until tomorrow (because of the sedation medicines used during the test).    FOLLOW UP: Our staff will call the number listed on your records 48-72 hours following your procedure to check on you and address any questions or concerns that you may have regarding the information given to you following your procedure. If we do not reach you, we will leave a message.  We will attempt to reach you two times.  During this call, we will ask if you have developed any symptoms of COVID 19. If you develop any symptoms (ie: fever, flu-like symptoms, shortness of breath, cough etc.) before then, please call [phone].  If you test positive for Covid 19 in the 2 weeks post procedure, please call and report this information to [name].    If any biopsies were taken you will be contacted by phone or by letter within the next 1-3 weeks.  Please call us at [phone] if you have not heard about the biopsies in 3 weeks.    SIGNATURES/CONFIDENTIALITY: You and/or your care partner have signed paperwork which will be entered into your electronic medical record.  These signatures attest to the fact that that the information above on your After Visit  Summary has been reviewed and is understood.  Full responsibility of the confidentiality of this discharge information lies with you and/or your care-partner. 

## 2020-06-02 NOTE — Progress Notes (Signed)
Report given to PACU, vss 

## 2020-06-02 NOTE — Progress Notes (Signed)
Pt is HIPPA.  Finding and discharge went over with only the pt.    No problems noted in the recovery room. maw

## 2020-06-02 NOTE — Progress Notes (Signed)
Pt. Reports no change in his medical or surgical history since his pre-visit 05/19/2020. 

## 2020-06-02 NOTE — Op Note (Signed)
[facility] Patient Name: Bill [name] Procedure Date: 06/02/2020 11:17 AM MRN: [id number] Endoscopist: Milus Banister , MD Age: 60 Referring MD:  Date of Birth: 1960-06-02 Gender: Male Account #: [id number] Procedure:                Colonoscopy Indications:              High risk colon cancer surveillance: Personal                            history of colonic polyps; Colonoscopy 2013 1.5cm                            TA; colonoscopy 2016 no polyps Medicines:                Monitored Anesthesia Care Procedure:                Pre-Anesthesia Assessment:                           - Prior to the procedure, a History and Physical                            was performed, and patient medications and                            allergies were reviewed. The patient's tolerance of                            previous anesthesia was also reviewed. The risks                            and benefits of the procedure and the sedation                            options and risks were discussed with the patient.                            All questions were answered, and informed consent                            was obtained. Prior Anticoagulants: The patient has                            taken no previous anticoagulant or antiplatelet                            agents. ASA Grade Assessment: II - A patient with                            mild systemic disease. After reviewing the risks                            and benefits, the patient was deemed in  satisfactory condition to undergo the procedure.                           After obtaining informed consent, the colonoscope                            was passed under direct vision. Throughout the                            procedure, the patient's blood pressure, pulse, and                            oxygen saturations were monitored continuously. The                            Olympus [id number])  [id number] was introduced                            through the anus and advanced to the the cecum,                            identified by appendiceal orifice and ileocecal                            valve. The colonoscopy was performed without                            difficulty. The patient tolerated the procedure                            well. The quality of the bowel preparation was                            good. The ileocecal valve, appendiceal orifice, and                            rectum were photographed. Scope In: 11:21:04 AM Scope Out: 11:31:03 AM Scope Withdrawal Time: 0 hours 6 minutes 50 seconds  Total Procedure Duration: 0 hours 9 minutes 59 seconds  Findings:                 Multiple small and large-mouthed diverticula were                            found in the entire colon.                           A localized area of mildly congested mucosa was                            found in the descending colon for about 10cm, not                            circumferential (unsual IBD, ischemic, prep  effect?) . Biopsies were taken with a cold forceps                            for histology.                           Medium sized internal hemorrhoids.                           The exam was otherwise without abnormality on                            direct and retroflexion views. Complications:            No immediate complications. Estimated blood loss:                            None. Estimated Blood Loss:     Estimated blood loss: none. Impression:               - Diverticulosis in the entire examined colon.                           - A localized area of mildly congested mucosa was                            found in the descending colon for about 10cm, not                            circumferential (unsual IBD, ischemic, prep                            effect?) . Biopsies were taken with a cold forceps                            for  histology.                           - Medium sized internal hemorrhoids.                           - The examination was otherwise normal on direct                            and retroflexion views. Recommendation:           - Patient has a contact number available for                            emergencies. The signs and symptoms of potential                            delayed complications were discussed with the                            patient. Return to normal activities tomorrow.  Written discharge instructions were provided to the                            patient.                           - Resume previous diet.                           - Continue present medications.                           - Repeat colonoscopy in 10 years for screening.                           - Await final pathology results. Milus Banister, MD 06/02/2020 11:40:15 AM This report has been signed electronically.

## 2020-06-04 ENCOUNTER — Telehealth: Payer: Self-pay

## 2020-06-04 NOTE — Telephone Encounter (Signed)
  Follow up Call-  Call back number 06/02/2020  Post procedure Call Back phone  # [phone]  Permission to leave phone message Yes  Some recent data might be hidden     Patient questions:  Do you have a fever, pain , or abdominal swelling? No. Pain Score  0 *  Have you tolerated food without any problems? Yes.    Have you been able to return to your normal activities? Yes.    Do you have any questions about your discharge instructions: Diet   No. Medications  No. Follow up visit  No.  Do you have questions or concerns about your Care? No.  Actions: * If pain score is 4 or above: No action needed, pain <4. 1. Have you developed a fever since your procedure? no  2.   Have you had an respiratory symptoms (SOB or cough) since your procedure? no  3.   Have you tested positive for COVID 19 since your procedure no  4.   Have you had any family members/close contacts diagnosed with the COVID 19 since your procedure?  no   If yes to any of these questions please route to Joylene John, RN and Joella Prince, RN

## 2020-06-12 ENCOUNTER — Encounter: Payer: Self-pay | Admitting: Gastroenterology

## 2020-09-05 ENCOUNTER — Other Ambulatory Visit: Payer: Self-pay | Admitting: Internal Medicine

## 2020-09-05 DIAGNOSIS — R569 Unspecified convulsions: Secondary | ICD-10-CM

## 2020-09-09 ENCOUNTER — Encounter: Payer: Self-pay | Admitting: *Deleted

## 2020-09-09 NOTE — Telephone Encounter (Signed)
Will refill, patient needs follow up with [name] as well as with [facility]. Per last note, it was requested he call them and schedule a follow up appointment. Thanks!

## 2020-09-14 ENCOUNTER — Other Ambulatory Visit: Payer: Self-pay | Admitting: Internal Medicine

## 2020-09-14 DIAGNOSIS — N400 Enlarged prostate without lower urinary tract symptoms: Secondary | ICD-10-CM

## 2020-09-26 IMAGING — CR DG WRIST COMPLETE 3+V*R*
4 series · 4 of 4 positions shown · non-contrast
Comparison: None.

CLINICAL DATA: One year history of wrist pain

EXAM:
RIGHT WRIST - COMPLETE 3+ VIEW

[wrist pa]
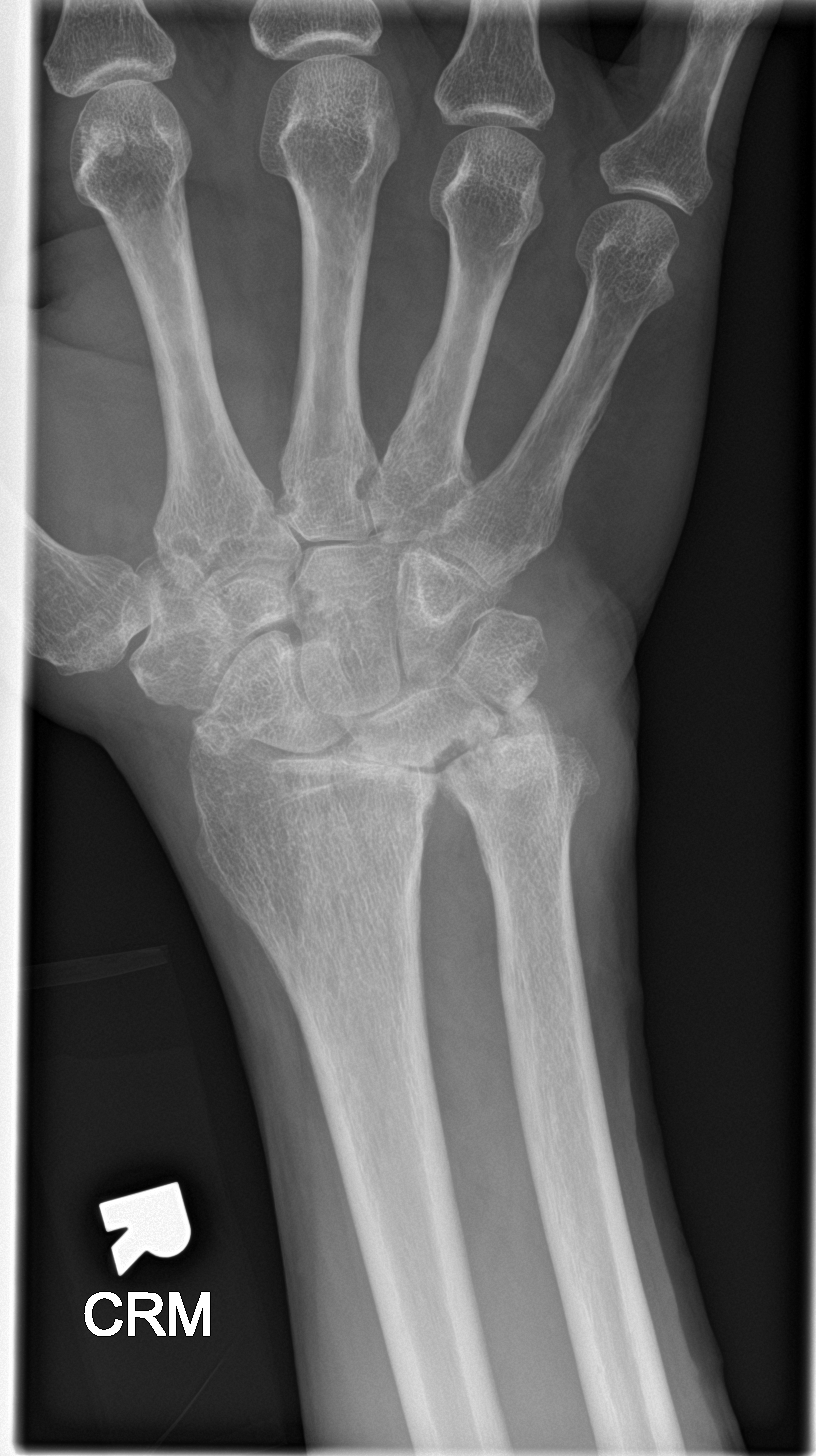

[wrist obl]
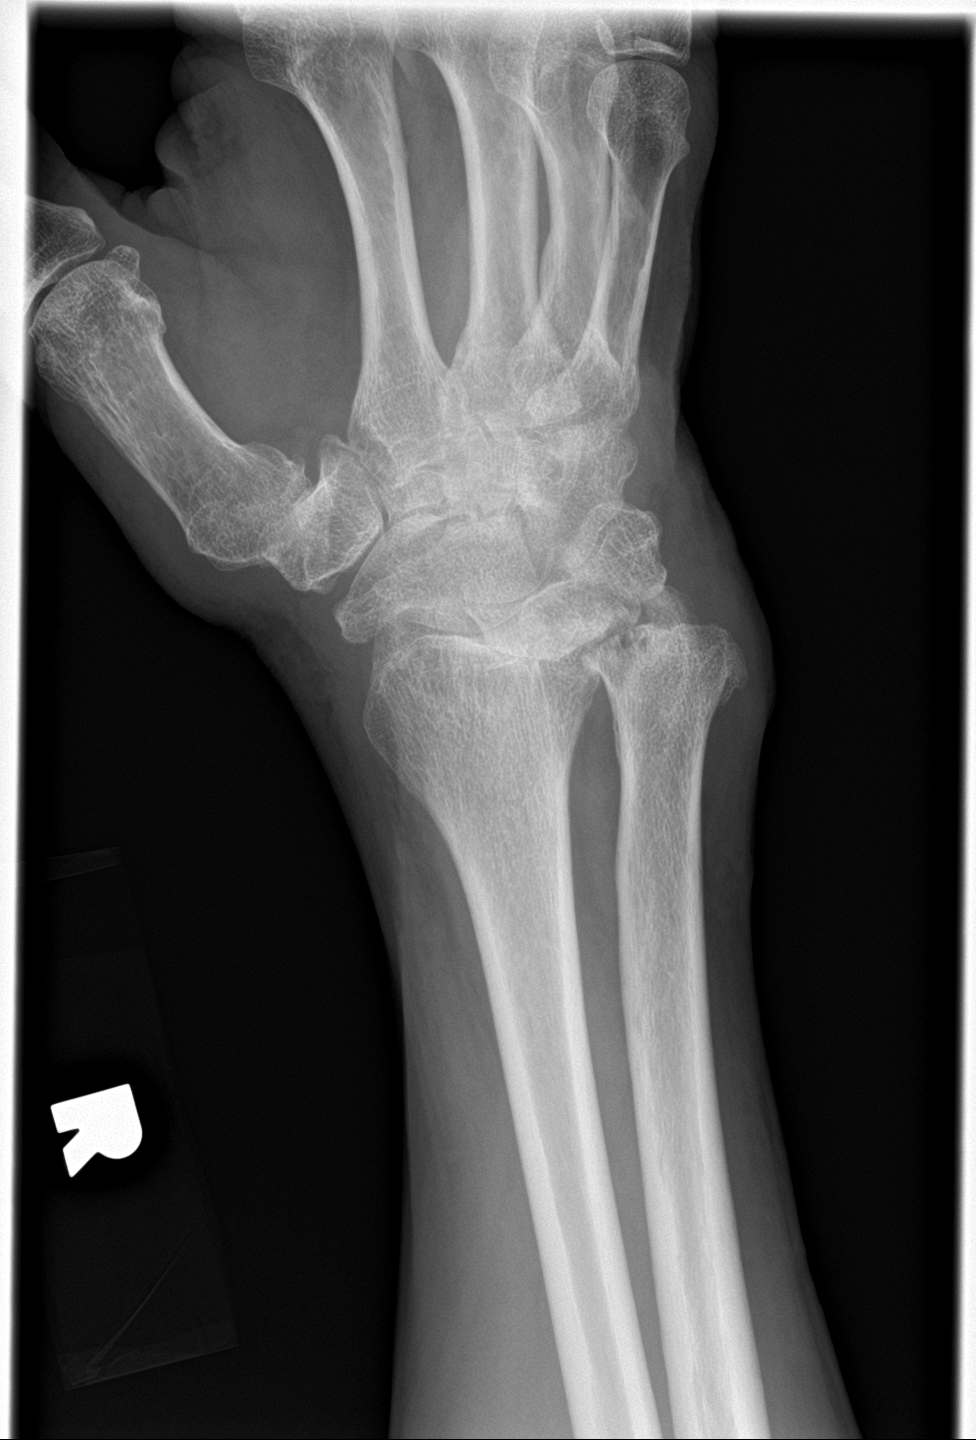

[wrist lat]
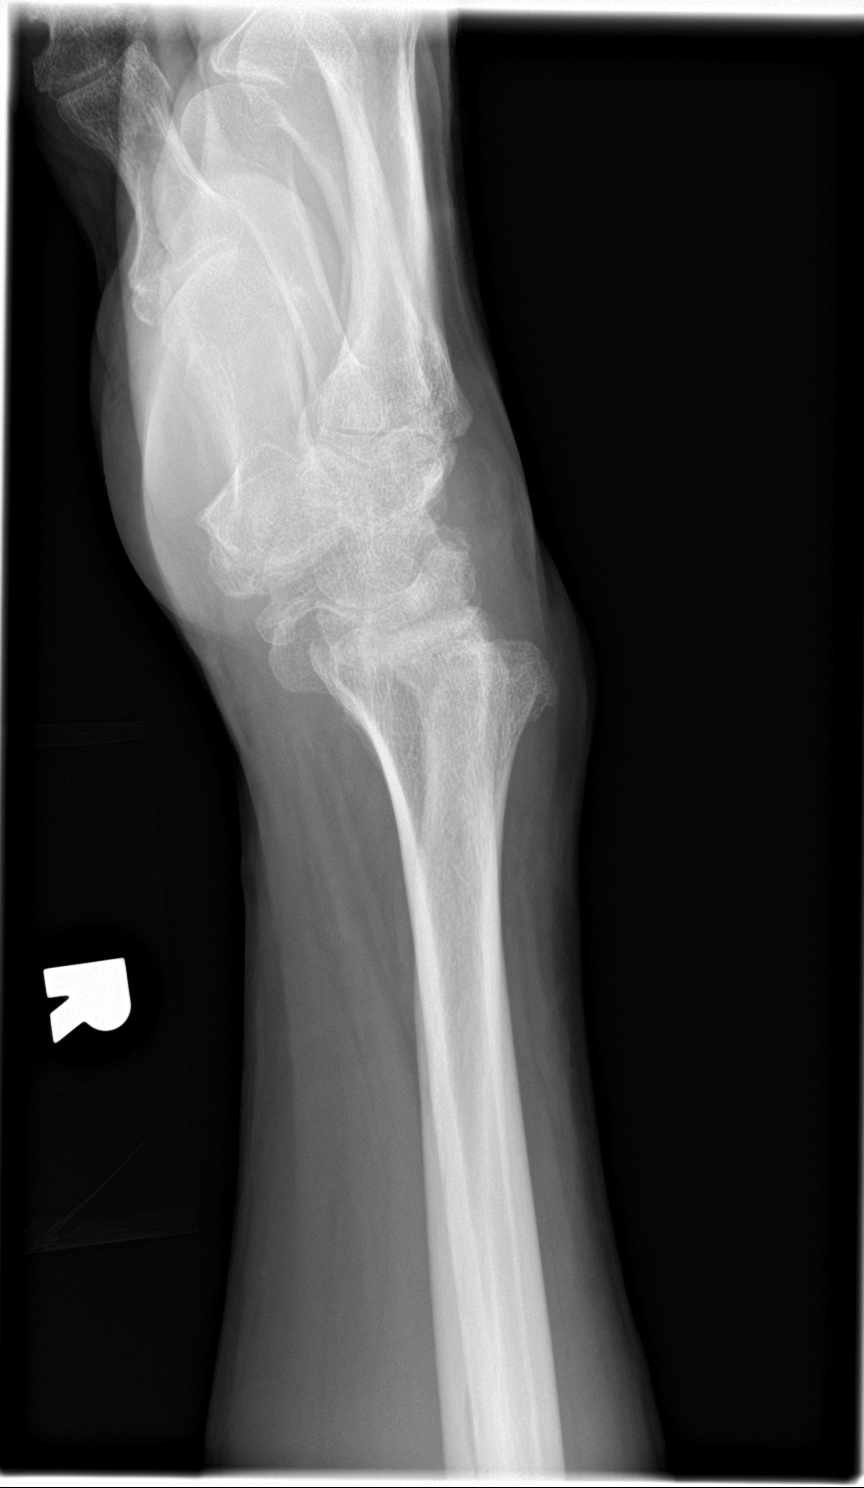

[wrist navicular]
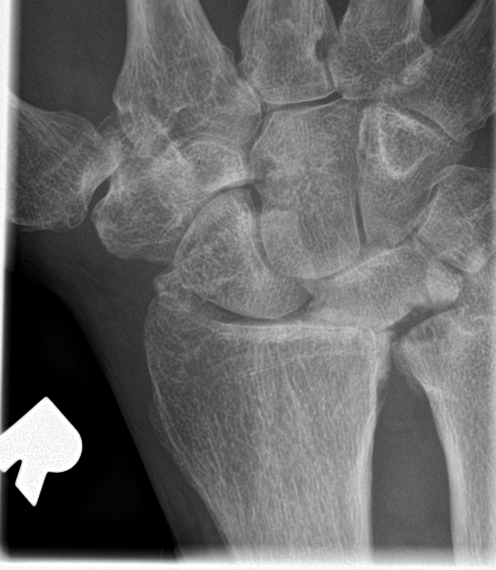

[4 of 4 positions shown; findings below may reference images not displayed]

FINDINGS: Severe joint space narrowing of the radiocarpal joint and ulnocarpal
joint with subchondral cystic changes in the ulna and lunate. Mild
osteoarthritis of the first CMC joint. Small lucencies at the base
of the second and third metacarpals which may reflect subchondral
cysts versus erosions. Soft tissues are unremarkable.
IMPRESSION: 1. Severe joint space narrowing of the radiocarpal and ulnocarpal
joint space with subchondral reactive changes in the lunate and
ulna. Differential considerations include underlying inflammatory or
crystalline arthropathy.

## 2020-10-28 ENCOUNTER — Ambulatory Visit: Payer: 59 | Admitting: Student

## 2020-10-28 VITALS — BP 103/61 | HR 73 | Temp 98.0°F | Ht 67.0 in | Wt 126.9 lb

## 2020-10-28 DIAGNOSIS — D126 Benign neoplasm of colon, unspecified: Secondary | ICD-10-CM

## 2020-10-28 DIAGNOSIS — E7800 Pure hypercholesterolemia, unspecified: Secondary | ICD-10-CM | POA: Diagnosis not present

## 2020-10-28 DIAGNOSIS — Z122 Encounter for screening for malignant neoplasm of respiratory organs: Secondary | ICD-10-CM

## 2020-10-28 DIAGNOSIS — R748 Abnormal levels of other serum enzymes: Secondary | ICD-10-CM

## 2020-10-28 DIAGNOSIS — R634 Abnormal weight loss: Secondary | ICD-10-CM

## 2020-10-28 DIAGNOSIS — M05731 Rheumatoid arthritis with rheumatoid factor of right wrist without organ or systems involvement: Secondary | ICD-10-CM | POA: Diagnosis not present

## 2020-10-28 DIAGNOSIS — Z720 Tobacco use: Secondary | ICD-10-CM | POA: Diagnosis not present

## 2020-10-28 DIAGNOSIS — C61 Malignant neoplasm of prostate: Secondary | ICD-10-CM

## 2020-10-28 MED ORDER — NICOTINE POLACRILEX 2 MG MT GUM: 2.0000 mg | CHEWING_GUM | OROMUCOSAL | 2 refills | Status: AC | PRN

## 2020-10-28 NOTE — Patient Instructions (Addendum)
It was a pleasure seeing you in clinic. Today we discussed:   Weight loss: please eat plenty of fish, chicken, and beans to get enough protein. You can also try protein shakes such as Ensure to help you get enough nutrition.  Follow up with you urologist to discuss diet changes and foods you should eat.  Rheumatoid arthritis: We will check some labs since you are taking methotrexate. Please continue to take folic acid as well. I will call you with results. You can contact your rheumatologist for follow up at [address], [place], [place] [address] Phone: [phone]  Urology: Please follow up with your urologist on 8/29 at [facility], [place], [place] [address] Phone: [phone]  Tobacco use: Please try using nicotine gum in addition to the patches to help with smoking cravings. You can have 1-2 pieces and hour and up to 24 pieces a day of gum. I have also ordered a CT of the chest for lung cancer screening given you smoking history.    Please follow up in 6 months  If you have any questions or concerns, please call our clinic at [phone] between 9am-5pm and after hours call [phone] and ask for the internal medicine resident on call. If you feel you are having a medical emergency please call 911.   Thank you, we look forward to helping you remain healthy!

## 2020-10-29 LAB — CMP14 + ANION GAP
ALT: 19 IU/L (ref 0–44)
AST: 17 IU/L (ref 0–40)
Albumin/Globulin Ratio: 1.3 (ref 1.2–2.2)
Albumin: 4 g/dL (ref 3.8–4.9)
Alkaline Phosphatase: 161 IU/L — ABNORMAL HIGH (ref 44–121)
Anion Gap: 13 mmol/L (ref 10.0–18.0)
BUN/Creatinine Ratio: 20 (ref 10–24)
BUN: 16 mg/dL (ref 8–27)
Bilirubin Total: <0.2 mg/dL (ref 0.0–1.2)
CO2: 23 mmol/L (ref 20–29)
Calcium: 9.4 mg/dL (ref 8.6–10.2)
Chloride: 103 mmol/L (ref 96–106)
Creatinine, Ser: 0.8 mg/dL (ref 0.76–1.27)
Globulin, Total: 3.2 g/dL (ref 1.5–4.5)
Glucose: 86 mg/dL (ref 65–99)
Potassium: 4.2 mmol/L (ref 3.5–5.2)
Sodium: 139 mmol/L (ref 134–144)
Total Protein: 7.2 g/dL (ref 6.0–8.5)
eGFR: 101 mL/min/{1.73_m2} (ref >59)

## 2020-10-29 LAB — LIPID PANEL
Chol/HDL Ratio: 2.8 ratio (ref 0.0–5.0)
Cholesterol, Total: 193 mg/dL (ref 100–199)
HDL: 69 mg/dL (ref >39)
LDL Chol Calc (NIH): 99 mg/dL (ref 0–99)
Triglycerides: 148 mg/dL (ref 0–149)
VLDL Cholesterol Cal: 25 mg/dL (ref 5–40)

## 2020-10-29 LAB — CBC
Hematocrit: 39.8 % (ref 37.5–51.0)
Hemoglobin: 14.4 g/dL (ref 13.0–17.7)
MCH: 29.9 pg (ref 26.6–33.0)
MCHC: 36.2 g/dL — ABNORMAL HIGH (ref 31.5–35.7)
MCV: 83 fL (ref 79–97)
Platelets: 303 10*3/uL (ref 150–450)
RBC: 4.81 x10E6/uL (ref 4.14–5.80)
RDW: 12.9 % (ref 11.6–15.4)
WBC: 7.5 10*3/uL (ref 3.4–10.8)

## 2020-10-31 DIAGNOSIS — R634 Abnormal weight loss: Secondary | ICD-10-CM | POA: Insufficient documentation

## 2020-10-31 NOTE — Assessment & Plan Note (Signed)
Patient with roughly 20-25 pound weight loss in the past 6 months. Patient state he has restricted his diet greatly ever since he was found to have prostate cancer. Feels he does not know what he can eat and is very anxious that foods will worsen his prostate cancer. States he has cut out sugar, most carbs except oatmeal, and red meats from his diet. States he is very tired of eating such a limited diet and had been eating less due lack of variety in his diet. Reports he is often hungry but does not know what is safe for him to eat.   Appears much of weight loss may be explained but restriction in his diet.Discussed he should eat a healthy balanced diet with plenty of protein such as chicken, fish, and legumes. Encouraged him to eat whole grains as well as leafy vegetable and fruit.  Dicussed he could also try protein shakes such as ensure as well.  Discussed age appropriate cancer screening given his weight loss. Last colonoscopy this year without signs of malignancy. Of note patient is a smoker and reports he has bee smoking 1/2-1pack a day for more than 20 years. Discussed lung cancer screening which he is agreeable, see lung cancer screening for more details.  Plan Patient will try to incorporate more food into his diet and eat larger meals  Will discuss diet with urology at upcoming follow up on 8/29 to discuss any specific diet recommendations for his prostate cancer Low dose chest CT Follow up in 6 months

## 2020-10-31 NOTE — Assessment & Plan Note (Signed)
Shared Decision Making: Current smoker (1 ppd for 20 years) with approximately a 20 pack year history. The patient meets USPTF recommendations for lung cancer screening with low dose CT in persons age 60 - 38 yrs with a 30 pack year history who are currently smoking or have quit in the past 15 yrs. We have discussed the risks and benefits of screening, including but not limited to the following. Lung cancer screening might detect about one half of lung cancer at an early, curative stage. For the average screened person, the risk of dying from lung cancer will decrease from 1.7% to 1.4% with screening. About 1 in 4 screened patients will have a false positive result possibly leading to more radiation exposure, cost, anxiety, and / or invasive procedures. 95% of false positives will not lead to a diagnosis of cancer. After discussion, Bill [name] has decided to pursue lung cancer screening.

## 2020-10-31 NOTE — Assessment & Plan Note (Signed)
Patient notes he been smoking about 1 pack per day recently. He is interested in quitting. Has not stared using patches. Requesting gum or lozenges to have something to put in his mouth which worked well for him in the past. States he plans to set a quit date in the next few weeks. Encouraged him to start using patches and set a quit date.  Plan - Continue with nicotine patches - Nicotine gum prescribed - continue to discuss cesation

## 2020-10-31 NOTE — Assessment & Plan Note (Addendum)
Follow with Dr. Diona Fanti with [facility]. Has follow up PSA and appointment on 11/03/2020. Patient repost transrectal [name] prostate biopsy was negative although I am unable to see records. Will request records.   Addendum PSA monitoring with urology on 8/29 with stable PSA of 7.11>6.24, has 6 month follow up for PSA monitoring

## 2020-10-31 NOTE — Assessment & Plan Note (Addendum)
Follow with [facility]. Takes methotrexate and folic acid. Reports symptoms are currently well controlled, Denies joint pain. No synovitis on exam.   Plan - Continue methotrexate and folic acid - Contact information given for [facility] for follow up - CMP, CBC    Addendum CMP with isolated alk phos of 161. On chart review this has been present since 2017. Prior GGT wnl. Patient is otherwise asymptomatic. Elevation likely in the setting of chronic phenytoin use for seizure disorder.

## 2020-10-31 NOTE — Assessment & Plan Note (Signed)
Not on statin therapy. Lipid panel sent.

## 2020-10-31 NOTE — Progress Notes (Signed)
   CC: weight loss  HPI:  [name] is a 60 y.o. male with history listed below presents for follow up and weight loss over the past 6 months. Please refer to problem based charting for further details and assessment and plan of current problem and chronic medical conditions.   Past Medical History:  Diagnosis Date   Alcohol abuse    Hx of , stopped in 2000. Had 12 pack for 25 years.    Arthritis    S/P RIGHT TOTAL HIP ARTHROPLASTY 2007 --PT HAS NOTICED PAIN RIGHT LEG--IS TOLD HE NEEDS RT HIP REVISION   Avascular necrosis of bone of hip ([place]) 2007   Right   Chronic chest pain    Normal stress test in 2003   History of tobacco abuse    Pleural plaque    MULTIPLE BILATERAL PLEURAL PLAQUES, which had not changed on CT as of 1/04   Seizure disorder ([place])    Started at age 54. Last EMR record 05/25/09   Seizures ([place])    PT THINKS GRAND MAL  - LAST TIME PT THINKS WAS 2017   Review of Systems: Negative as per HPI  Physical Exam:  Vitals:   10/28/20 1549  BP: 103/61  Pulse: 73  Temp: 98 F (36.7 C)  TempSrc: Oral  SpO2: 98%  Weight: 126 lb 14.4 oz (57.6 kg)  Height: '5\' 7"'$  (1.702 m)   Constitutional: Pleasant 60 year old man, thin appearing. In no acute distress.  HENT: Normocephalic and atraumatic, EOMI, conjunctiva normal, moist mucous membranes Cardiovascular: Normal rate, regular rhythm, S1 and S2 present, no murmurs, rubs, gallops.  Distal pulses intact Respiratory: No respiratory distress, no accessory muscle use.  Effort is normal.  Lungs are clear to auscultation bilaterally. GI: Nondistended, soft, nontender to palpation, normal active bowel sounds, no organomegaly Musculoskeletal: Normal bulk and tone.  No peripheral edema noted. Neurological: Is alert and oriented x4, no apparent focal deficits noted. Skin: Warm and dry. No rash Psychiatric: Normal mood and affect. Assessment & Plan:   See Encounters Tab for problem based charting.  Patient discussed  with Dr. Jimmye Norman

## 2020-11-04 NOTE — Progress Notes (Signed)
Internal Medicine Clinic Attending ? ?Case discussed with Dr. Liang  At the time of the visit.  We reviewed the resident?s history and exam and pertinent patient test results.  I agree with the assessment, diagnosis, and plan of care documented in the resident?s note. ? ?

## 2020-12-11 ENCOUNTER — Ambulatory Visit (INDEPENDENT_AMBULATORY_CARE_PROVIDER_SITE_OTHER): Payer: 59 | Admitting: Internal Medicine

## 2020-12-11 ENCOUNTER — Encounter: Payer: Self-pay | Admitting: Internal Medicine

## 2020-12-11 VITALS — BP 111/76 | HR 54 | Temp 98.4°F | Wt 128.4 lb

## 2020-12-11 DIAGNOSIS — M25511 Pain in right shoulder: Secondary | ICD-10-CM

## 2020-12-11 DIAGNOSIS — M25452 Effusion, left hip: Secondary | ICD-10-CM

## 2020-12-11 NOTE — Progress Notes (Signed)
Acute Office Visit  Subjective:    Patient ID: Bill [name], male    DOB: 06-Mar-1961, 60 y.o.   MRN: [id number]  Chief Complaint  Patient presents with   Leg Swelling   swelling left inner thigh    C/o are of swelling inner thigh Noticed Tues night     HPI Patient is in today for c/o left thigh swelling that began two days ago. Patient denies trauma or injury to the thigh. Patient states he was lying on the couch and noticed that his left thigh appeared larger than the right thigh. Patient denies associated pain, numbness/tingling, erythema, rash or lesions. Patient paints boats for a living and denies any hazardous environmental contact. Right leg is unaffected and below the knee of the left leg is also unaffected. Patient endorses that this has never happened before in the past.  Past Medical History:  Diagnosis Date   Alcohol abuse    Hx of , stopped in 2000. Had 12 pack for 25 years.    Arthritis    S/P RIGHT TOTAL HIP ARTHROPLASTY 2007 --PT HAS NOTICED PAIN RIGHT LEG--IS TOLD HE NEEDS RT HIP REVISION   Avascular necrosis of bone of hip ([place]) 2007   Right   Chronic chest pain    Normal stress test in 2003   History of tobacco abuse    Pleural plaque    MULTIPLE BILATERAL PLEURAL PLAQUES, which had not changed on CT as of 1/04   Seizure disorder ([place])    Started at age 85. Last EMR record 05/25/09   Seizures ([place])    PT THINKS GRAND MAL  - LAST TIME PT THINKS WAS 2017    Past Surgical History:  Procedure Laterality Date   COLONOSCOPY  10/29/2014   Ardis Hughs   POLYPECTOMY     Right Hip Replacement  8/07 / 2013   Dr. Alvan Dame    Family History  Problem Relation Age of Onset   Heart attack Father        51s   Arthritis Mother    Seizures Brother    Colon cancer Neg Hx    Colon polyps Neg Hx    Esophageal cancer Neg Hx    Rectal cancer Neg Hx    Stomach cancer Neg Hx     Social History   Socioeconomic History   Marital status: Single    Spouse name: Not on  file   Number of children: Not on file   Years of education: Not on file   Highest education level: Not on file  Occupational History   Not on file  Tobacco Use   Smoking status: Every Day    Packs/day: 0.50    Years: 20.00    Pack years: 10.00    Types: Cigarettes    Last attempt to quit: 03/08/2016    Years since quitting: 4.7   Smokeless tobacco: Never   Tobacco comments:    .5 PPD  Vaping Use   Vaping Use: Never used  Substance and Sexual Activity   Alcohol use: No    Alcohol/week: 0.0 standard drinks    Comment: Former   Drug use: No   Sexual activity: Yes    Birth control/protection: Condom    Comment: 1 Partner,   Other Topics Concern   Not on file  Social History Narrative   Custodian, single.    Social Determinants of Health   Financial Resource Strain: Not on file  Food Insecurity: Not on file  Transportation Needs: Not on file  Physical Activity: Not on file  Stress: Not on file  Social Connections: Not on file  Intimate Partner Violence: Not on file    Outpatient Medications Prior to Visit  Medication Sig Dispense Refill   methotrexate (RHEUMATREX) 2.5 MG tablet Take 2.5 mg by mouth once a week. 6 tablets at one time     nicotine (NICODERM CQ - DOSED IN MG/24 HOURS) 14 mg/24hr patch Place 1 patch (14 mg total) onto the skin daily. 30 patch 0   nicotine polacrilex (NICORETTE) 2 MG gum Take 1 each (2 mg total) by mouth as needed for smoking cessation. 20 each 2   phenytoin (DILANTIN) 300 MG ER capsule TAKE 1 CAPSULE BY MOUTH EVERY DAY. DO NOT COMBINE THIS MEDICATION WITH ANOTHER MEDICATION** 90 capsule 1   tamsulosin (FLOMAX) 0.4 MG CAPS capsule TAKE 1 CAPSULE(0.4 MG) BY MOUTH TWICE DAILY 60 capsule 3   VITAMIN D PO Take by mouth.     No facility-administered medications prior to visit.    No Known Allergies  Review of Systems  Constitutional:  Negative for chills and fever.  Respiratory:  Negative for cough and shortness of breath.    Cardiovascular:  Positive for leg swelling. Negative for chest pain and palpitations.  Gastrointestinal:  Negative for abdominal pain, constipation, diarrhea, nausea and vomiting.  Musculoskeletal:  Negative for myalgias.      Objective:    Physical Exam Constitutional:      General: He is awake. He is not in acute distress. HENT:     Head: Normocephalic and atraumatic.  Eyes:     General: Lids are normal.  Neck:     Comments: Dry skin noted around the neck Cardiovascular:     Rate and Rhythm: Normal rate.     Heart sounds: Normal heart sounds.  Pulmonary:     Effort: Pulmonary effort is normal.     Breath sounds: Normal breath sounds.  Abdominal:     General: Abdomen is flat.     Palpations: Abdomen is soft.     Tenderness: There is no abdominal tenderness.  Musculoskeletal:     Right upper leg: Normal.     Left upper leg: Swelling present. No lacerations or tenderness.       Legs:  Skin:    General: Skin is warm and dry.  Neurological:     General: No focal deficit present.     Mental Status: He is alert.  Psychiatric:        Mood and Affect: Mood normal.        Behavior: Behavior is cooperative.   BP 111/76 (BP Location: Left Arm, Patient Position: Sitting, Cuff Size: Normal)   Pulse (!) 54   Temp 98.4 F (36.9 C) (Oral)   Wt 128 lb 6.4 oz (58.2 kg)   SpO2 100%   BMI 20.11 kg/m  Wt Readings from Last 3 Encounters:  12/11/20 128 lb 6.4 oz (58.2 kg)  10/28/20 126 lb 14.4 oz (57.6 kg)  06/02/20 134 lb (60.8 kg)    Health Maintenance Due  Topic Date Due   COVID-19 Vaccine (1) Never done   Zoster Vaccines- Shingrix (1 of 2) Never done   INFLUENZA VACCINE  10/06/2020    There are no preventive care reminders to display for this patient.   Lab Results  Component Value Date   TSH 1.070 06/15/2011   Lab Results  Component Value Date   WBC 7.5 10/28/2020   HGB  14.4 10/28/2020   HCT 39.8 10/28/2020   MCV 83 10/28/2020   PLT 303 10/28/2020   Lab  Results  Component Value Date   NA 139 10/28/2020   K 4.2 10/28/2020   CO2 23 10/28/2020   GLUCOSE 86 10/28/2020   BUN 16 10/28/2020   CREATININE 0.80 10/28/2020   BILITOT <0.2 10/28/2020   ALKPHOS 161 (H) 10/28/2020   AST 17 10/28/2020   ALT 19 10/28/2020   PROT 7.2 10/28/2020   ALBUMIN 4.0 10/28/2020   CALCIUM 9.4 10/28/2020   EGFR 101 10/28/2020   Lab Results  Component Value Date   CHOL 193 10/28/2020   Lab Results  Component Value Date   HDL 69 10/28/2020   Lab Results  Component Value Date   LDLCALC 99 10/28/2020   Lab Results  Component Value Date   TRIG 148 10/28/2020   Lab Results  Component Value Date   CHOLHDL 2.8 10/28/2020   No results found for: HGBA1C     Assessment & Plan:   Swelling of left thigh Patient reports left thigh swelling for the past two days with no associated symptoms. No trauma or injury to the thigh. Isolated swelling of the left thigh noted. Patient denies recent travels. Patient has no history of heart disease. Patient not any anticoagulation. No history of clots.    PLAN: Venous doppler of lower extremities to r/o DVT Recommended to use warm compress Follow up with results      Problem List Items Addressed This Visit   None Visit Diagnoses     Swelling of joint of pelvic region or thigh, left    -  Primary   Relevant Orders   VAS [name] LOWER EXTREMITY VENOUS (DVT)        No orders of the defined types were placed in this encounter.    Timothy Lasso, MD

## 2020-12-11 NOTE — Patient Instructions (Signed)
Imaging of thigh and blood vessels in the thigh to see what could be going on  2. Use warm compress  3. If symptoms worsen, please do not hesitate to call the office

## 2020-12-12 DIAGNOSIS — M25511 Pain in right shoulder: Secondary | ICD-10-CM | POA: Insufficient documentation

## 2020-12-12 NOTE — Progress Notes (Signed)
ADDENDUM created in error

## 2020-12-15 NOTE — Progress Notes (Signed)
Internal Medicine Clinic Attending  I saw and evaluated the patient.  I personally confirmed the key portions of the history and exam documented by Dr. Ariwodo and I reviewed pertinent patient test results.  The assessment, diagnosis, and plan were formulated together and I agree with the documentation in the resident's note.   

## 2020-12-19 ENCOUNTER — Ambulatory Visit (HOSPITAL_COMMUNITY): Payer: 59 | Attending: Internal Medicine

## 2020-12-25 ENCOUNTER — Other Ambulatory Visit: Payer: Self-pay

## 2020-12-25 ENCOUNTER — Ambulatory Visit (HOSPITAL_COMMUNITY)
Admission: RE | Admit: 2020-12-25 | Discharge: 2020-12-25 | Disposition: A | Discharge status: Home / Self Care | Payer: 59 | Source: Ambulatory Visit | Attending: Internal Medicine | Admitting: Internal Medicine

## 2020-12-25 DIAGNOSIS — M25452 Effusion, left hip: Secondary | ICD-10-CM | POA: Insufficient documentation

## 2021-01-06 ENCOUNTER — Ambulatory Visit (HOSPITAL_COMMUNITY): Payer: 59

## 2021-03-04 ENCOUNTER — Other Ambulatory Visit: Payer: Self-pay | Admitting: Student

## 2021-03-04 DIAGNOSIS — R569 Unspecified convulsions: Secondary | ICD-10-CM

## 2021-05-05 ENCOUNTER — Encounter: Payer: Self-pay | Admitting: Internal Medicine

## 2021-05-05 ENCOUNTER — Ambulatory Visit (INDEPENDENT_AMBULATORY_CARE_PROVIDER_SITE_OTHER): Payer: PRIVATE HEALTH INSURANCE | Admitting: Internal Medicine

## 2021-05-05 VITALS — BP 124/73 | HR 56 | Temp 98.5°F | Wt 128.2 lb

## 2021-05-05 DIAGNOSIS — C61 Malignant neoplasm of prostate: Secondary | ICD-10-CM | POA: Diagnosis not present

## 2021-05-05 LAB — POCT URINALYSIS DIPSTICK
Blood, UA: NEGATIVE
Glucose, UA: NEGATIVE
Leukocytes, UA: NEGATIVE
Nitrite, UA: NEGATIVE
Protein, UA: POSITIVE — AB
Spec Grav, UA: >=1.03 — AB (ref 1.010–1.025)
Urobilinogen, UA: 0.2 E.U./dL
pH, UA: 5 (ref 5.0–8.0)

## 2021-05-05 NOTE — Assessment & Plan Note (Addendum)
Patient with history of low-risk prostate cancer, follows with Dr Diona Fanti at [facility]. He is requesting PSA today. Reports improvement in nocturia but does endorse some dysuria. Denies any recent sexual activity or penile discharge.   Plan: PSA and urinalysis today Patient to follow up with urologist    ADDENDUM: PSA elevated 5.2> 7.6 at this visit. POC UA with trace proteinuria noted. Discussed results with patient. Patient is following up with urologist in April. Will repeat UA at next visit

## 2021-05-05 NOTE — Patient Instructions (Addendum)
[name],  It was a pleasure seeing you in clinic. Today we discussed:   Prostate cancer follow up: We will check PSA today. Follow up with your urologist  Urinary symptoms: We will check urinalysis today. I will call you with any abnormal results   If you have any questions or concerns, please call our clinic at [phone] between 9am-5pm and after hours call [phone] and ask for the internal medicine resident on call. If you feel you are having a medical emergency please call 911.   Thank you, we look forward to helping you remain healthy!

## 2021-05-05 NOTE — Addendum Note (Signed)
Addended by: Truddie Crumble on: 05/05/2021 05:01 PM   Modules accepted: Orders

## 2021-05-05 NOTE — Progress Notes (Signed)
° °  CC: follow up prostate cancer, dysuria   HPI:  [name] is a 62 y.o. male with PMHx as stated below presenting for follow up of his prostate cancer. He reports improved nocturia but is having symptoms of dysuria. Please see problem based charting for complete assessment and plan.   Past Medical History:  Diagnosis Date   Alcohol abuse    Hx of , stopped in 2000. Had 12 pack for 25 years.    Arthritis    S/P RIGHT TOTAL HIP ARTHROPLASTY 2007 --PT HAS NOTICED PAIN RIGHT LEG--IS TOLD HE NEEDS RT HIP REVISION   Avascular necrosis of bone of hip ([place]) 2007   Right   Chronic chest pain    Normal stress test in 2003   History of tobacco abuse    Pleural plaque    MULTIPLE BILATERAL PLEURAL PLAQUES, which had not changed on CT as of 1/04   Seizure disorder ([place])    Started at age 13. Last EMR record 05/25/09   Seizures ([place])    PT THINKS GRAND MAL  - LAST TIME PT THINKS WAS 2017   Review of Systems:  Negative except as stated in HPI  Physical Exam:  Vitals:   05/05/21 1003  BP: 124/73  Pulse: (!) 56  Temp: 98.5 F (36.9 C)  TempSrc: Oral  SpO2: 99%  Weight: 128 lb 3.2 oz (58.2 kg)   Physical Exam  Constitutional: Thin elderly male, No distress.  Cardiovascular: Normal rate, regular rhythm, S1 and S2 present, no murmurs, rubs, gallops.  Distal pulses intact Respiratory: No respiratory distress, Lungs are clear to auscultation bilaterally. GI: Nondistended, soft, nontender to palpation, normal bowel sounds Musculoskeletal: Normal bulk and tone. Skin: Warm and dry.  No rash, erythema, lesions noted. Psychiatric: Normal mood and affect.   Assessment & Plan:   See Encounters Tab for problem based charting.  Patient discussed with Dr.  Cain Sieve

## 2021-05-06 LAB — PSA: Prostate Specific Ag, Serum: 7.6 ng/mL — ABNORMAL HIGH (ref 0.0–4.0)

## 2021-05-11 NOTE — Progress Notes (Signed)
Internal Medicine Clinic Attending  Case discussed with Dr. Marva Panda  At the time of the visit.  We reviewed the residents history and exam and pertinent patient test results.  I agree with the assessment, diagnosis, and plan of care documented in the residents note.   PSA increased from 5 to 7.6. Resident communicated results with patient. He has follow up with Urology in April

## 2021-05-15 ENCOUNTER — Other Ambulatory Visit: Payer: Self-pay

## 2021-05-15 ENCOUNTER — Ambulatory Visit (INDEPENDENT_AMBULATORY_CARE_PROVIDER_SITE_OTHER): Payer: PRIVATE HEALTH INSURANCE | Admitting: Urology

## 2021-05-15 ENCOUNTER — Encounter: Payer: Self-pay | Admitting: Urology

## 2021-05-15 VITALS — BP 136/69 | HR 69 | Ht 67.0 in | Wt 128.0 lb

## 2021-05-15 DIAGNOSIS — C61 Malignant neoplasm of prostate: Secondary | ICD-10-CM | POA: Diagnosis not present

## 2021-05-15 DIAGNOSIS — R350 Frequency of micturition: Secondary | ICD-10-CM | POA: Insufficient documentation

## 2021-05-15 DIAGNOSIS — N401 Enlarged prostate with lower urinary tract symptoms: Secondary | ICD-10-CM | POA: Diagnosis not present

## 2021-05-15 LAB — URINALYSIS, ROUTINE W REFLEX MICROSCOPIC
Bilirubin, UA: NEGATIVE
Glucose, UA: NEGATIVE
Ketones, UA: NEGATIVE
Leukocytes,UA: NEGATIVE
Nitrite, UA: NEGATIVE
Protein,UA: NEGATIVE
RBC, UA: NEGATIVE
Specific Gravity, UA: 1.025 (ref 1.005–1.030)
Urobilinogen, Ur: 0.2 mg/dL (ref 0.2–1.0)
pH, UA: 5.5 (ref 5.0–7.5)

## 2021-05-15 LAB — BLADDER SCAN AMB NON-IMAGING: Scan Result: 7

## 2021-05-15 NOTE — Progress Notes (Signed)
? ?Assessment: ?1. Prostate cancer ([place]); low risk; diagnosed 3/21; on active surveillance   ?2. Benign localized prostatic hyperplasia with lower urinary tract symptoms (LUTS)   ?3. Urinary frequency   ? ? ?Plan: ?I reviewed his records from Bill [name] including office notes, lab results, pathology results, and imaging results. ?I discussed management of low risk prostate cancer with active surveillance with Bill [name] today.  I think it is reasonable to continue with active surveillance at this time. ?We will schedule for prostate MRI for reevaluation of his low risk prostate cancer. ?I will contact him with results when available. ?Continue tamsulosin. ?Return to office in 6 months with PSA. ? ?Chief Complaint:  ?Chief Complaint  ?Patient presents with  ? Prostate Cancer  ? ? ?History of Present Illness: ? ?Bill [name] is a 61 y.o. year old male who is seen in consultation from Bill Beard, MD for evaluation of prostate cancer.   ?He was initially diagnosed with low risk prostate cancer in March 2021 by Bill [name].  He has been managed with active surveillance. ?PSA results: ?10/20 5.2 ?1/21 6.28 ?7/21 5.91 ?2/22 7.11 ?8/22 6.24 ?2/23 7.6 ? ?Prostate cancer history: ?3/21   TRUS/BX: volume 62 ml; 2/12 cores (right base lateral, right mid lateral) revealed Gleason 3+3 adenocarcinoma.  ?12/21 Prostate MRI: volume of 64.7 mL, no evidence of trans capsular/SV/neurovascular involvement, no bony involvement, prominent external iliac and inguinal nodes, no suspicious lesions. ?2/22   TRUS/BX:  volume 69.7 ml; no malignancy, atypia in 1 core (Right base)  ? ?He has a history of lower urinary tract symptoms.  He has been managed with tamsulosin. ? ?He presents today to establish care in Bill [name].  He continues on tamsulosin.  His lower urinary tract symptoms remain stable.  He reports some decreased force of stream and nocturia.  He also has occasional dysuria and takes AZO for this.  No gross  hematuria or flank pain. ?IPSS = 9 today. ? ?Past Medical History:  ?Past Medical History:  ?Diagnosis Date  ? Alcohol abuse   ? Hx of , stopped in 2000. Had 12 pack for 25 years.   ? Arthritis   ? S/P RIGHT TOTAL HIP ARTHROPLASTY 2007 --PT HAS NOTICED PAIN RIGHT LEG--IS TOLD HE NEEDS RT HIP REVISION  ? Avascular necrosis of bone of hip ([place]) 2007  ? Right  ? Chronic chest pain   ? Normal stress test in 2003  ? History of tobacco abuse   ? Pleural plaque   ? MULTIPLE BILATERAL PLEURAL PLAQUES, which had not changed on CT as of 1/04  ? Seizure disorder ([place])   ? Started at age 61. Last EMR record 05/25/09  ? Seizures ([place])   ? PT THINKS GRAND MAL  - LAST TIME PT THINKS WAS 2017  ? ? ?Past Surgical History:  ?Past Surgical History:  ?Procedure Laterality Date  ? COLONOSCOPY  10/29/2014  ? Ardis Hughs  ? POLYPECTOMY    ? Right Hip Replacement  8/07 / 2013  ? Dr. Alvan Dame  ? ? ?Allergies:  ?No Known Allergies ? ?Family History:  ?Family History  ?Problem Relation Age of Onset  ? Heart attack Father   ?     46s  ? Arthritis Mother   ? Seizures Brother   ? Colon cancer Neg Hx   ? Colon polyps Neg Hx   ? Esophageal cancer Neg Hx   ? Rectal cancer Neg Hx   ? Stomach cancer Neg Hx   ? ? ?  Social History:  ?Social History  ? ?Tobacco Use  ? Smoking status: Every Day  ?  Packs/day: 0.50  ?  Years: 20.00  ?  Pack years: 10.00  ?  Types: Cigarettes  ?  Last attempt to quit: 03/08/2016  ?  Years since quitting: 5.1  ? Smokeless tobacco: Never  ? Tobacco comments:  ?  .5 PPD  ?Vaping Use  ? Vaping Use: Never used  ?Substance Use Topics  ? Alcohol use: No  ?  Alcohol/week: 0.0 standard drinks  ?  Comment: Former  ? Drug use: No  ? ? ?Review of symptoms:  ?Constitutional:  Negative for unexplained weight loss, night sweats, fever, chills ?ENT:  Negative for nose bleeds, sinus pain, painful swallowing ?CV:  Negative for chest pain, shortness of breath, exercise intolerance, palpitations, loss of consciousness ?Resp:  Negative for cough, wheezing,  shortness of breath ?GI:  Negative for nausea, vomiting, diarrhea, bloody stools ?GU:  Positives noted in HPI; otherwise negative for gross hematuria, urinary incontinence ?Neuro:  Negative for seizures, poor balance, limb weakness, slurred speech ?Psych:  Negative for lack of energy, depression, anxiety ?Endocrine:  Negative for polydipsia, polyuria, symptoms of hypoglycemia (dizziness, hunger, sweating) ?Hematologic:  Negative for anemia, purpura, petechia, prolonged or excessive bleeding, use of anticoagulants  ?Allergic:  Negative for difficulty breathing or choking as a result of exposure to anything; no shellfish allergy; no allergic response (rash/itch) to materials, foods ? ?Physical exam: ?BP 136/69   Pulse 69   Ht '5\' 7"'$  (1.702 m)   Wt 128 lb (58.1 kg)   BMI 20.05 kg/m?  ?GENERAL APPEARANCE:  Well appearing, well developed, well nourished, NAD ?HEENT: Atraumatic, Normocephalic, oropharynx clear. ?NECK: Supple without lymphadenopathy or thyromegaly. ?LUNGS: Clear to auscultation bilaterally. ?HEART: Regular Rate and Rhythm without murmurs, gallops, or rubs. ?ABDOMEN: Soft, non-tender, No Masses. ?EXTREMITIES: Moves all extremities well.  Without clubbing, cyanosis, or edema. ?NEUROLOGIC:  Alert and oriented x 3, normal gait, CN II-XII grossly intact.  ?MENTAL STATUS:  Appropriate. ?BACK:  Non-tender to palpation.  No CVAT ?SKIN:  Warm, dry and intact.   ?GU: ?Prostate: 60 gm, NT, no nodules ?Rectum: Normal tone,  no masses or tenderness ? ? ?Results: ?U/A: Dipstick negative ? ?Results for orders placed or performed in visit on 05/15/21 (from the past 24 hour(s))  ?BLADDER SCAN AMB NON-IMAGING  ? Collection Time: 05/15/21  9:57 AM  ?Result Value Ref Range  ? Scan Result 7   ? ? ?

## 2021-05-15 NOTE — Progress Notes (Signed)
post void residual=7 mL ?

## 2021-06-09 ENCOUNTER — Ambulatory Visit: Payer: PRIVATE HEALTH INSURANCE | Admitting: Urology

## 2021-06-29 ENCOUNTER — Other Ambulatory Visit: Payer: PRIVATE HEALTH INSURANCE

## 2021-07-17 ENCOUNTER — Ambulatory Visit (HOSPITAL_COMMUNITY)
Admission: RE | Admit: 2021-07-17 | Discharge: 2021-07-17 | Disposition: A | Discharge status: Home / Self Care | Payer: 59 | Source: Ambulatory Visit | Attending: Urology | Admitting: Urology

## 2021-07-17 DIAGNOSIS — C61 Malignant neoplasm of prostate: Secondary | ICD-10-CM | POA: Insufficient documentation

## 2021-07-17 MED ORDER — GADOBUTROL 1 MMOL/ML IV SOLN
5.5000 mL | Freq: Once | INTRAVENOUS | Status: AC | PRN
  Administered 2021-07-17: 5.5 mL via INTRAVENOUS

## 2021-07-24 ENCOUNTER — Encounter: Payer: Self-pay | Admitting: Urology

## 2021-07-30 ENCOUNTER — Telehealth: Payer: Self-pay

## 2021-07-30 NOTE — Telephone Encounter (Signed)
Patient left a voice message 07-30-21  Wanting to discuss MRI results.  Please advise.  Call back:  [phone] (H)   Thanks, Helene Kelp

## 2021-07-30 NOTE — Telephone Encounter (Signed)
Patient left a voice message 07-30-21  Wanting to discuss MRI results.  Please advise.  Call back:  [phone] (H)   Thanks, Helene Kelp

## 2021-07-31 NOTE — Telephone Encounter (Signed)
Please see below, do you want to schedule for an apt to discuss results?

## 2021-08-24 ENCOUNTER — Other Ambulatory Visit: Payer: Self-pay | Admitting: Student

## 2021-08-24 DIAGNOSIS — N400 Enlarged prostate without lower urinary tract symptoms: Secondary | ICD-10-CM

## 2021-09-02 ENCOUNTER — Encounter: Payer: Self-pay | Admitting: Urology

## 2021-09-02 ENCOUNTER — Ambulatory Visit (INDEPENDENT_AMBULATORY_CARE_PROVIDER_SITE_OTHER): Payer: PRIVATE HEALTH INSURANCE | Admitting: Urology

## 2021-09-02 VITALS — BP 116/53 | HR 58

## 2021-09-02 DIAGNOSIS — N401 Enlarged prostate with lower urinary tract symptoms: Secondary | ICD-10-CM

## 2021-09-02 DIAGNOSIS — R300 Dysuria: Secondary | ICD-10-CM

## 2021-09-02 DIAGNOSIS — Z8546 Personal history of malignant neoplasm of prostate: Secondary | ICD-10-CM

## 2021-09-02 DIAGNOSIS — C61 Malignant neoplasm of prostate: Secondary | ICD-10-CM

## 2021-09-02 LAB — URINALYSIS, ROUTINE W REFLEX MICROSCOPIC
Bilirubin, UA: NEGATIVE
Glucose, UA: NEGATIVE
Ketones, UA: NEGATIVE
Leukocytes,UA: NEGATIVE
Nitrite, UA: NEGATIVE
Protein,UA: NEGATIVE
RBC, UA: NEGATIVE
Specific Gravity, UA: <1.005 — ABNORMAL LOW (ref 1.005–1.030)
Urobilinogen, Ur: 0.2 mg/dL (ref 0.2–1.0)
pH, UA: 6 (ref 5.0–7.5)

## 2021-09-02 NOTE — Progress Notes (Signed)
Assessment: 1. Dysuria   2. Prostate cancer ([place]); low risk; diagnosed 3/21; on active surveillance   3. Benign localized prostatic hyperplasia with lower urinary tract symptoms (LUTS)      Plan: No evidence of UTI or prostatitis.  Advised him that his dysuria may be related to increased urine concentration and recommended that he increase his fluid intake. Continue tamsulosin. Keep follow-up for September 2023 with PSA.  Chief Complaint:  Chief Complaint  Patient presents with   Dysuria    History of Present Illness:  Bill [name] is a 61 y.o. year old male who is seen for evaluation of dysuria.  He is followed for prostate cancer. He was initially diagnosed with low risk prostate cancer in March 2021 by Dr. Diona Fanti.  He has been managed with active surveillance. PSA results: 10/20 5.2 1/21 6.28 7/21 5.91 2/22 7.11 8/22 6.24 2/23 7.6  Prostate cancer history: 3/21   TRUS/BX: volume 62 ml; 2/12 cores (right base lateral, right mid lateral) revealed Gleason 3+3 adenocarcinoma.  12/21 Prostate MRI: volume of 64.7 mL, no evidence of trans capsular/SV/neurovascular involvement, no bony involvement, prominent external iliac and inguinal nodes, no suspicious lesions. 2/22   TRUS/BX:  volume 69.7 ml; no malignancy, atypia in 1 core (Right base)   He has a history of lower urinary tract symptoms.  He has been managed with tamsulosin.  At his visit in 3/23, he continued on tamsulosin.  His lower urinary tract symptoms were stable.  He reported some decreased force of stream and nocturia.  He also reported occasional dysuria and takes AZO for this.  No gross hematuria or flank pain. IPSS = 9.  Prostate MRI from 07/23/2021 showed no evidence of high-grade prostate cancer, prostate volume measured 73 mL.  He presents today for evaluation of dysuria.  He reports intermittent burning with urination, typically worse in the late afternoon.  No gross hematuria.  No change in any  other urinary symptoms.  He continues on tamsulosin. IPSS = 11 today. He is also reporting some swelling in the left hip and upper thigh area.  No pain with movement.  No history of trauma.  Portions of the above documentation were copied from a prior visit for review purposes only.   Past Medical History:  Past Medical History:  Diagnosis Date   Alcohol abuse    Hx of , stopped in 2000. Had 12 pack for 25 years.    Arthritis    S/P RIGHT TOTAL HIP ARTHROPLASTY 2007 --PT HAS NOTICED PAIN RIGHT LEG--IS TOLD HE NEEDS RT HIP REVISION   Avascular necrosis of bone of hip ([place]) 2007   Right   Chronic chest pain    Normal stress test in 2003   History of tobacco abuse    Pleural plaque    MULTIPLE BILATERAL PLEURAL PLAQUES, which had not changed on CT as of 1/04   Seizure disorder ([place])    Started at age 69. Last EMR record 05/25/09   Seizures ([place])    PT THINKS GRAND MAL  - LAST TIME PT THINKS WAS 2017    Past Surgical History:  Past Surgical History:  Procedure Laterality Date   COLONOSCOPY  10/29/2014   Ardis Hughs   POLYPECTOMY     Right Hip Replacement  8/07 / 2013   Dr. Alvan Dame    Allergies:  No Known Allergies  Family History:  Family History  Problem Relation Age of Onset   Heart attack Father  23s   Arthritis Mother    Seizures Brother    Colon cancer Neg Hx    Colon polyps Neg Hx    Esophageal cancer Neg Hx    Rectal cancer Neg Hx    Stomach cancer Neg Hx     Social History:  Social History   Tobacco Use   Smoking status: Every Day    Packs/day: 0.50    Years: 20.00    Total pack years: 10.00    Types: Cigarettes    Last attempt to quit: 03/08/2016    Years since quitting: 5.4   Smokeless tobacco: Never   Tobacco comments:    .5 PPD  Vaping Use   Vaping Use: Never used  Substance Use Topics   Alcohol use: No    Alcohol/week: 0.0 standard drinks of alcohol    Comment: Former   Drug use: No    ROS: Constitutional:  Negative for fever, chills,  weight loss CV: Negative for chest pain, previous MI, hypertension Respiratory:  Negative for shortness of breath, wheezing, sleep apnea, frequent cough GI:  Negative for nausea, vomiting, bloody stool, GERD  Physical exam: BP (!) 116/53   Pulse (!) 58  GENERAL APPEARANCE:  Well appearing, well developed, well nourished, NAD HEENT:  Atraumatic, normocephalic, oropharynx clear NECK:  Supple without lymphadenopathy or thyromegaly ABDOMEN:  Soft, non-tender, no masses EXTREMITIES:  Moves all extremities well, without clubbing, cyanosis, or edema NEUROLOGIC:  Alert and oriented x 3, normal gait, CN II-XII grossly intact MENTAL STATUS:  appropriate BACK:  Non-tender to palpation, No CVAT SKIN:  Warm, dry, and intact GU: Prostate: 60 g, NT, no nodules Rectum: Normal tone,  no masses or tenderness   Results: U/A: dipstick negative

## 2021-11-11 ENCOUNTER — Other Ambulatory Visit: Payer: PRIVATE HEALTH INSURANCE

## 2021-11-18 ENCOUNTER — Ambulatory Visit: Payer: PRIVATE HEALTH INSURANCE | Admitting: Urology

## 2022-01-08 ENCOUNTER — Ambulatory Visit (HOSPITAL_COMMUNITY)
Admission: EM | Admit: 2022-01-08 | Discharge: 2022-01-08 | Disposition: A | Discharge status: Home / Self Care | Payer: Commercial Managed Care - HMO

## 2022-01-08 ENCOUNTER — Encounter (HOSPITAL_COMMUNITY): Payer: Self-pay | Admitting: Emergency Medicine

## 2022-01-08 DIAGNOSIS — K219 Gastro-esophageal reflux disease without esophagitis: Secondary | ICD-10-CM | POA: Diagnosis not present

## 2022-01-08 MED ORDER — ALUM & MAG HYDROXIDE-SIMETH 200-200-20 MG/5ML PO SUSP
30.0000 mL | Freq: Once | ORAL | Status: AC
  Administered 2022-01-08: 30 mL via ORAL

## 2022-01-08 MED ORDER — ALUM & MAG HYDROXIDE-SIMETH 200-200-20 MG/5ML PO SUSP
ORAL | Status: AC
  Filled 2022-01-08: qty 30

## 2022-01-08 MED ORDER — FAMOTIDINE 40 MG PO TABS: 40.0000 mg | ORAL_TABLET | Freq: Every day | ORAL | 1 refills | Status: AC

## 2022-01-08 NOTE — ED Provider Notes (Signed)
[place]    CSN: [id number] Arrival date & time: 01/08/22  1526      History   Chief Complaint Chief Complaint  Patient presents with   Abdominal Pain    HPI Bill [name] is a 61 y.o. male.   Patient presents today with a several week history of intermittent upper abdominal pain.  He has not identified any specific triggers and denies any association with oral intake.  He reports that initially pain was much more extreme but has become more mild recently.  He has not identified any significant triggers.  He has tried Pepto-Bismol with temporary improvement of symptoms.  Denies any changes in bowel habits or vomiting.  Does report occasional nausea.  He does not drink alcohol.  He does occasionally use NSAIDs including Goody powders.  Denies history of gastrointestinal disorder.  Currently pain is rated 5 on a 0-10 pain scale, localized to center/upper abdomen, described as aching, no aggravating or alleviating factors identified.    Past Medical History:  Diagnosis Date   Alcohol abuse    Hx of , stopped in 2000. Had 12 pack for 25 years.    Arthritis    S/P RIGHT TOTAL HIP ARTHROPLASTY 2007 --PT HAS NOTICED PAIN RIGHT LEG--IS TOLD HE NEEDS RT HIP REVISION   Avascular necrosis of bone of hip ([place]) 2007   Right   Chronic chest pain    Normal stress test in 2003   History of tobacco abuse    Pleural plaque    MULTIPLE BILATERAL PLEURAL PLAQUES, which had not changed on CT as of 1/04   Seizure disorder ([place])    Started at age 73. Last EMR record 05/25/09   Seizures ([place])    PT THINKS GRAND MAL  - LAST TIME PT THINKS WAS 2017    Patient Active Problem List   Diagnosis Date Noted   Benign localized prostatic hyperplasia with lower urinary tract symptoms (LUTS) 05/15/2021   Urinary frequency 05/15/2021   Weight loss 10/31/2020   Elevated alkaline phosphatase level secondary to phenytoin  08/23/2019   Rheumatoid arthritis ([place]) 02/08/2019   Left foot pain  10/18/2018   Prostate cancer ([place]) 08/09/2018   Encounter for screening for lung cancer 08/08/2018   Centrilobular emphysema ([place]) 08/07/2018   Right wrist pain 01/24/2018   Allergic rhinitis 06/15/2017   Hyperlipidemia 07/23/2015   Left ankle pain 07/23/2015   Tubular adenoma of colon 08/28/2014   Need for immunization against influenza 02/21/2012   Tobacco abuse 02/21/2012   Avascular necrosis of bone of right hip ([place]) 02/25/2006   Seizures ([place]) 02/25/2006   Multiple lung nodules on CT 02/25/2006    Past Surgical History:  Procedure Laterality Date   COLONOSCOPY  10/29/2014   Ardis Hughs   POLYPECTOMY     Right Hip Replacement  8/07 / 2013   Dr. Alvan Dame       Home Medications    Prior to Admission medications   Medication Sig Start Date End Date Taking? Authorizing Provider  famotidine (PEPCID) 40 MG tablet Take 1 tablet (40 mg total) by mouth at bedtime. 01/08/22  Yes Jovaughn Wojtaszek, Derry Skill, PA-C  folic acid (FOLVITE) 1 MG tablet Take 1 mg by mouth daily.    [provider]  methotrexate (RHEUMATREX) 2.5 MG tablet Take 2.5 mg by mouth once a week. 6 tablets at one time    [provider]  nicotine (NICODERM CQ - DOSED IN MG/24 HOURS) 14 mg/24hr patch Place 1 patch (  14 mg total) onto the skin daily. Patient not taking: Reported on 09/02/2021 05/09/20   Alexandria Lodge, MD  phenytoin (DILANTIN) 300 MG ER capsule TAKE 1 CAPSULE BY MOUTH EVERY DAY. DO NOT COMBINE THIS MEDICATION WITH ANOTHER MEDICATION** 03/07/21   Iona Beard, MD  tamsulosin (FLOMAX) 0.4 MG CAPS capsule TAKE 1 CAPSULE(0.4 MG) BY MOUTH TWICE DAILY 08/25/21   Iona Beard, MD  VITAMIN D PO Take by mouth.    [provider]    Family History Family History  Problem Relation Age of Onset   Heart attack Father        28s   Arthritis Mother    Seizures Brother    Colon cancer Neg Hx    Colon polyps Neg Hx    Esophageal cancer Neg Hx    Rectal cancer Neg Hx    Stomach cancer Neg Hx      Social History Social History   Tobacco Use   Smoking status: Every Day    Packs/day: 0.50    Years: 20.00    Total pack years: 10.00    Types: Cigarettes    Last attempt to quit: 03/08/2016    Years since quitting: 5.8   Smokeless tobacco: Never   Tobacco comments:    .5 PPD  Vaping Use   Vaping Use: Never used  Substance Use Topics   Alcohol use: No    Alcohol/week: 0.0 standard drinks of alcohol    Comment: Former   Drug use: No     Allergies   Patient has no known allergies.   Review of Systems Review of Systems  Constitutional:  Positive for activity change. Negative for appetite change, fatigue and fever.  Respiratory:  Negative for cough and shortness of breath.   Cardiovascular:  Negative for chest pain.  Gastrointestinal:  Positive for abdominal pain and nausea. Negative for blood in stool, constipation, diarrhea and vomiting.     Physical Exam Triage Vital Signs ED Triage Vitals  Enc Vitals Group     BP 01/08/22 1626 103/67     Pulse Rate 01/08/22 1626 75     Resp 01/08/22 1626 14     Temp 01/08/22 1626 98.4 F (36.9 C)     Temp Source 01/08/22 1626 Oral     SpO2 01/08/22 1626 98 %     Weight --      Height --      Head Circumference --      Peak Flow --      Pain Score 01/08/22 1625 5     Pain Loc --      Pain Edu? --      Excl. in [place]? --    No data found.  Updated Vital Signs BP 103/67 (BP Location: Left Arm)   Pulse 75   Temp 98.4 F (36.9 C) (Oral)   Resp 14   SpO2 98%   Visual Acuity Right Eye Distance:   Left Eye Distance:   Bilateral Distance:    Right Eye Near:   Left Eye Near:    Bilateral Near:     Physical Exam Vitals reviewed.  Constitutional:      General: He is awake.     Appearance: Normal appearance. He is well-developed. He is not ill-appearing.     Comments: Very pleasant male appears stated age in no acute distress sitting comfortably in exam room  HENT:     Head: Normocephalic and atraumatic.      Mouth/Throat:  Pharynx: Uvula midline. No oropharyngeal exudate or posterior oropharyngeal erythema.  Cardiovascular:     Rate and Rhythm: Normal rate and regular rhythm.     Heart sounds: Normal heart sounds, S1 normal and S2 normal. No murmur heard. Pulmonary:     Effort: Pulmonary effort is normal.     Breath sounds: Normal breath sounds. No stridor. No wheezing, rhonchi or rales.     Comments: Clear to auscultation bilaterally Abdominal:     General: Bowel sounds are normal.     Palpations: Abdomen is soft.     Tenderness: There is no abdominal tenderness. There is no right CVA tenderness, left CVA tenderness, guarding or rebound.     Comments: Mild tenderness palpation in central abdomen.  No evidence of acute on physical exam.  Neurological:     Mental Status: He is alert.  Psychiatric:        Behavior: Behavior is cooperative.      UC Treatments / Results  Labs (all labs ordered are listed, but only abnormal results are displayed) Labs Reviewed - No data to display  EKG   Radiology No results found.  Procedures Procedures (including critical care time)  Medications Ordered in UC Medications  alum & mag hydroxide-simeth (MAALOX/MYLANTA) 200-200-20 MG/5ML suspension 30 mL (30 mLs Oral Given 01/08/22 1654)    Initial Impression / Assessment and Plan / UC Course  I have reviewed the triage vital signs and the nursing notes.  Pertinent labs & imaging results that were available during my care of the patient were reviewed by me and considered in my medical decision making (see chart for details).     Patient is well-appearing, afebrile, nontoxic, nontachycardic.  No indication for emergent evaluation or imaging.  Patient was given Maalox in clinic with improvement of symptoms.  Discussed concern for GERD versus gastritis.  Patient was instructed to avoid NSAIDs and continue abstaining from alcohol.  We will start famotidine at night.  Recommended dietary  modifications for additional symptom relief.  Discussed that if he develops any additional symptoms including severe abdominal pain, melena, medic easier, vomiting, lethargy, weakness he needs to go to the emergency room.  If his symptoms are improving quickly he is to follow-up with his primary care first thing next week for further evaluation and management.  Strict return precautions given.  Work excuse note provided.  Final Clinical Impressions(s) / UC Diagnoses   Final diagnoses:  Gastroesophageal reflux disease, unspecified whether esophagitis present     Discharge Instructions      I am glad you are feeling better after the medication.  I am concerned that you have irritation of your stomach causing your symptoms.  Please avoid NSAIDs including aspirin, ibuprofen/Advil, naproxen/Aleve.  Start famotidine at night.  Avoid spicy/acidic foods.  Do not drink alcohol.  If your symptoms are not improving or if anything worsens including severe abdominal pain, blood in your stool, vomiting, weakness you need to go to the emergency room.     ED Prescriptions     Medication Sig Dispense Auth. Provider   famotidine (PEPCID) 40 MG tablet Take 1 tablet (40 mg total) by mouth at bedtime. 30 tablet Valdez Brannan, Derry Skill, PA-C      PDMP not reviewed this encounter.   Terrilee Croak, PA-C 01/08/22 1711

## 2022-01-08 NOTE — ED Triage Notes (Signed)
Pt c/o mid abd pains that is intermittent over past couple weeks. Denies vomiting but has little nausea, denies urainry or bowels.

## 2022-01-08 NOTE — Discharge Instructions (Signed)
I am glad you are feeling better after the medication.  I am concerned that you have irritation of your stomach causing your symptoms.  Please avoid NSAIDs including aspirin, ibuprofen/Advil, naproxen/Aleve.  Start famotidine at night.  Avoid spicy/acidic foods.  Do not drink alcohol.  If your symptoms are not improving or if anything worsens including severe abdominal pain, blood in your stool, vomiting, weakness you need to go to the emergency room.

## 2022-02-03 ENCOUNTER — Ambulatory Visit (INDEPENDENT_AMBULATORY_CARE_PROVIDER_SITE_OTHER): Payer: Commercial Managed Care - HMO | Admitting: Student

## 2022-02-03 ENCOUNTER — Encounter: Payer: Self-pay | Admitting: Student

## 2022-02-03 VITALS — BP 106/71 | HR 66 | Ht 67.0 in | Wt 127.8 lb

## 2022-02-03 DIAGNOSIS — F17210 Nicotine dependence, cigarettes, uncomplicated: Secondary | ICD-10-CM | POA: Diagnosis not present

## 2022-02-03 DIAGNOSIS — Z23 Encounter for immunization: Secondary | ICD-10-CM | POA: Diagnosis not present

## 2022-02-03 DIAGNOSIS — R569 Unspecified convulsions: Secondary | ICD-10-CM | POA: Diagnosis not present

## 2022-02-03 DIAGNOSIS — N400 Enlarged prostate without lower urinary tract symptoms: Secondary | ICD-10-CM

## 2022-02-03 DIAGNOSIS — Z720 Tobacco use: Secondary | ICD-10-CM

## 2022-02-03 MED ORDER — PHENYTOIN SODIUM EXTENDED 300 MG PO CAPS: 300.0000 mg | ORAL_CAPSULE | Freq: Every day | ORAL | 3 refills | Status: DC

## 2022-02-03 MED ORDER — NICOTINE POLACRILEX 2 MG MT LOZG: 2.0000 mg | LOZENGE | OROMUCOSAL | 0 refills | Status: DC | PRN

## 2022-02-03 MED ORDER — NICOTINE 14 MG/24HR TD PT24: 14.0000 mg | MEDICATED_PATCH | TRANSDERMAL | 0 refills | Status: DC

## 2022-02-03 MED ORDER — TAMSULOSIN HCL 0.4 MG PO CAPS: 0.4000 mg | ORAL_CAPSULE | Freq: Every day | ORAL | 3 refills | Status: DC

## 2022-02-03 NOTE — Patient Instructions (Addendum)
It was a pleasure seeing you today   Please get your covid vaccine and shingle vaccine at a local pharmacy  Please follow up with neurology I will make a referral

## 2022-02-08 ENCOUNTER — Telehealth: Payer: Self-pay

## 2022-02-08 NOTE — Progress Notes (Signed)
Established Patient Office Visit  Subjective   Patient ID: Bill [name], male    DOB: October 19, 1960  Age: 61 y.o. MRN: [id number]  Chief Complaint  Patient presents with   Follow-up    Bill [name] is a 61 y.o. person living with a history listed below who presents to clinic for follow up. Please refer to problem based charting for further details and assessment and plan of current problem and chronic medical conditions.     Patient Active Problem List   Diagnosis Date Noted   Benign localized prostatic hyperplasia with lower urinary tract symptoms (LUTS) 05/15/2021   Weight loss 10/31/2020   Elevated alkaline phosphatase level secondary to phenytoin  08/23/2019   Rheumatoid arthritis ([place]) 02/08/2019   Prostate cancer ([place]) 08/09/2018   Encounter for screening for lung cancer 08/08/2018   Centrilobular emphysema ([place]) 08/07/2018   Allergic rhinitis 06/15/2017   Hyperlipidemia 07/23/2015   Tubular adenoma of colon 08/28/2014   Need for immunization against influenza 02/21/2012   Tobacco abuse 02/21/2012   Avascular necrosis of bone of right hip ([place]) 02/25/2006   Seizures ([place]) 02/25/2006   Multiple lung nodules on CT 02/25/2006       ROS: negative as per HPI    Objective:     BP 106/71 (BP Location: Left Arm, Patient Position: Sitting, Cuff Size: Normal)   Pulse 66   Ht '5\' 7"'$  (1.702 m)   Wt 127 lb 12.8 oz (58 kg)   SpO2 100%   BMI 20.02 kg/m  BP Readings from Last 3 Encounters:  02/03/22 106/71  01/08/22 103/67  09/02/21 (!) 116/53      Physical Exam Constitutional:      General: He is not in acute distress.    Appearance: Normal appearance. He is normal weight.  HENT:     Mouth/Throat:     Mouth: Mucous membranes are moist.     Pharynx: Oropharynx is clear.  Eyes:     Extraocular Movements: Extraocular movements intact.     Conjunctiva/sclera: Conjunctivae normal.     Pupils: Pupils are equal, round, and reactive to light.  Cardiovascular:      Rate and Rhythm: Normal rate and regular rhythm.     Heart sounds: No murmur heard. Pulmonary:     Effort: Pulmonary effort is normal.     Breath sounds: No rhonchi or rales.  Abdominal:     General: Abdomen is flat. Bowel sounds are normal. There is no distension.     Palpations: Abdomen is soft.     Tenderness: There is no abdominal tenderness.  Musculoskeletal:        General: Normal range of motion.     Right lower leg: No edema.     Left lower leg: No edema.  Skin:    General: Skin is warm and dry.     Capillary Refill: Capillary refill takes less than 2 seconds.  Neurological:     General: No focal deficit present.     Mental Status: He is alert and oriented to person, place, and time.  Psychiatric:        Mood and Affect: Mood normal.        Behavior: Behavior normal.     No results found for any visits on 02/03/22.   The 10-year ASCVD risk score (Arnett DK, et al., 2019) is: 9.4%    Assessment & Plan:   Problem List Items Addressed This Visit       Other  Seizures (HCC) (Chronic)    No seizures for the past 8-9 years. Still taking dilantin but has not seen neurology in years. Stressed importance of follow up. Previously seen at [facility]. Referral placed for follow up today.       Relevant Medications   phenytoin (DILANTIN) 300 MG ER capsule   Other Relevant Orders   Ambulatory referral to Neurology   Tobacco abuse (Chronic)    Smoking about 1/2 a pack a days. Is not currently using nicotine patches or lozenges. Would like to restart this.  -nicotine 14 mg patches and '2mg'$  lozenges ordered  -continue to encourage cessation      Relevant Medications   nicotine (NICODERM CQ - DOSED IN MG/24 HOURS) 14 mg/24hr patch   Need for immunization against influenza - Primary    Influenza vaccine administered today.      Relevant Orders   Flu Vaccine QUAD 15moIM (Fluarix, Fluzone & Alfiuria Quad PF) (Completed)   Other Visit Diagnoses     Prostatic hypertrophy        Relevant Medications   tamsulosin (FLOMAX) 0.4 MG CAPS capsule       Return in about 3 months (around 05/06/2022).    JIona Beard MD

## 2022-02-08 NOTE — Progress Notes (Signed)
Internal Medicine Clinic Attending ? ?Case discussed with Dr. Liang  At the time of the visit.  We reviewed the resident?s history and exam and pertinent patient test results.  I agree with the assessment, diagnosis, and plan of care documented in the resident?s note. ? ?

## 2022-02-08 NOTE — Assessment & Plan Note (Signed)
Smoking about 1/2 a pack a days. Is not currently using nicotine patches or lozenges. Would like to restart this.  -nicotine 14 mg patches and '2mg'$  lozenges ordered  -continue to encourage cessation

## 2022-02-08 NOTE — Assessment & Plan Note (Signed)
No seizures for the past 8-9 years. Still taking dilantin but has not seen neurology in years. Stressed importance of follow up. Previously seen at [facility]. Referral placed for follow up today.

## 2022-02-08 NOTE — Assessment & Plan Note (Signed)
Influenza vaccine administered today.

## 2022-02-08 NOTE — Telephone Encounter (Signed)
Incoming fax from pharmacy: Phenytoin  Medication is on back order

## 2022-02-10 ENCOUNTER — Telehealth: Payer: Self-pay

## 2022-02-10 NOTE — Telephone Encounter (Signed)
Pt left a voice message 02-10-22.  Pt said it took 15 or more mins last night to urinate. It was painful.    Please advise.  Call back:  [phone]  Thanks, Helene Kelp

## 2022-02-11 NOTE — Telephone Encounter (Signed)
Spoke with patient. He was doing better today.   He wanted to have PSA labs done before visit. Will discuss concerns at visit on Dec. 15th.  Thanks, Helene Kelp

## 2022-02-12 ENCOUNTER — Other Ambulatory Visit: Payer: Commercial Managed Care - HMO

## 2022-02-12 DIAGNOSIS — C61 Malignant neoplasm of prostate: Secondary | ICD-10-CM

## 2022-02-13 LAB — PSA: Prostate Specific Ag, Serum: 5.8 ng/mL — ABNORMAL HIGH (ref 0.0–4.0)

## 2022-02-17 ENCOUNTER — Ambulatory Visit (INDEPENDENT_AMBULATORY_CARE_PROVIDER_SITE_OTHER): Payer: Commercial Managed Care - HMO

## 2022-02-17 DIAGNOSIS — R569 Unspecified convulsions: Secondary | ICD-10-CM

## 2022-02-17 NOTE — Progress Notes (Signed)
Phone call: Discussed switching to another pharmacy where dilantin is available. Patient will call with new pharmacy and I will send prescription.

## 2022-02-17 NOTE — Telephone Encounter (Signed)
Pt states he need to speak with a nurse about phenytoin (DILANTIN) 300 MG ER capsule. States this medication for back order. Please call pt back.

## 2022-02-17 NOTE — Telephone Encounter (Signed)
Patient need a telehealth appointment this week or next week to discuss seizure history and seizure medications as his dilantin is on backorder.

## 2022-02-18 NOTE — Progress Notes (Signed)
Sent via mychart

## 2022-02-19 ENCOUNTER — Ambulatory Visit: Payer: Commercial Managed Care - HMO | Admitting: Urology

## 2022-02-19 ENCOUNTER — Encounter: Payer: Self-pay | Admitting: Urology

## 2022-02-19 VITALS — BP 110/66 | HR 55

## 2022-02-19 DIAGNOSIS — C61 Malignant neoplasm of prostate: Secondary | ICD-10-CM

## 2022-02-19 DIAGNOSIS — R350 Frequency of micturition: Secondary | ICD-10-CM

## 2022-02-19 DIAGNOSIS — N401 Enlarged prostate with lower urinary tract symptoms: Secondary | ICD-10-CM | POA: Diagnosis not present

## 2022-02-19 LAB — URINALYSIS, ROUTINE W REFLEX MICROSCOPIC
Bilirubin, UA: NEGATIVE
Glucose, UA: NEGATIVE
Ketones, UA: NEGATIVE
Leukocytes,UA: NEGATIVE
Nitrite, UA: NEGATIVE
Protein,UA: NEGATIVE
RBC, UA: NEGATIVE
Specific Gravity, UA: 1.02 (ref 1.005–1.030)
Urobilinogen, Ur: 0.2 mg/dL (ref 0.2–1.0)
pH, UA: 5 (ref 5.0–7.5)

## 2022-02-19 MED ORDER — ALFUZOSIN HCL ER 10 MG PO TB24: 10.0000 mg | ORAL_TABLET | Freq: Every day | ORAL | 11 refills | Status: DC

## 2022-02-19 NOTE — Patient Instructions (Signed)

## 2022-02-19 NOTE — Progress Notes (Unsigned)
02/19/2022 10:57 AM   Joanette Gula 12/09/1960 [id number]  Referring provider: Iona Beard, MD [place],  [place] [id number]  Followup BPh and prostate cancer   HPI: [name] is a 61yo here for followup for BPh and prostate cancer. PSA decreased to 5.8 from 7.2. IPSS 17 QOL 3. MRI from 3 months ago showed no evidence of high grade prostate cancer. His urine stream is fair. He has nocturia 2-3x. Urinary frequency 2-3 hours. No straining to urinate. No urinayr hesitancy.    PMH: Past Medical History:  Diagnosis Date   Alcohol abuse    Hx of , stopped in 2000. Had 12 pack for 25 years.    Arthritis    S/P RIGHT TOTAL HIP ARTHROPLASTY 2007 --PT HAS NOTICED PAIN RIGHT LEG--IS TOLD HE NEEDS RT HIP REVISION   Avascular necrosis of bone of hip ([place]) 2007   Right   Chronic chest pain    Normal stress test in 2003   History of tobacco abuse    Pleural plaque    MULTIPLE BILATERAL PLEURAL PLAQUES, which had not changed on CT as of 1/04   Seizure disorder ([place])    Started at age 56. Last EMR record 05/25/09   Seizures ([place])    PT THINKS GRAND MAL  - LAST TIME PT THINKS WAS 2017    Surgical History: Past Surgical History:  Procedure Laterality Date   COLONOSCOPY  10/29/2014   Ardis Hughs   POLYPECTOMY     Right Hip Replacement  8/07 / 2013   Dr. Alvan Dame    Home Medications:  Allergies as of 02/19/2022   No Known Allergies      Medication List        Accurate as of February 19, 2022 10:57 AM. If you have any questions, ask your nurse or doctor.          famotidine 40 MG tablet Commonly known as: PEPCID Take 1 tablet (40 mg total) by mouth at bedtime.   folic acid 1 MG tablet Commonly known as: FOLVITE Take 1 mg by mouth daily.   methotrexate 2.5 MG tablet Commonly known as: RHEUMATREX Take 2.5 mg by mouth once a week. 6 tablets at one time   nicotine 14 mg/24hr patch Commonly known as: NICODERM CQ - dosed in mg/24 hours Place 1 patch (14 mg total)  onto the skin daily.   nicotine polacrilex 2 MG lozenge Commonly known as: Nicotine Mini Take 1 lozenge (2 mg total) by mouth as needed for smoking cessation.   phenytoin 300 MG ER capsule Commonly known as: DILANTIN Take 1 capsule (300 mg total) by mouth daily.   tamsulosin 0.4 MG Caps capsule Commonly known as: FLOMAX Take 1 capsule (0.4 mg total) by mouth daily after breakfast.   VITAMIN D PO Take by mouth.        Allergies: No Known Allergies  Family History: Family History  Problem Relation Age of Onset   Heart attack Father        58s   Arthritis Mother    Seizures Brother    Colon cancer Neg Hx    Colon polyps Neg Hx    Esophageal cancer Neg Hx    Rectal cancer Neg Hx    Stomach cancer Neg Hx     Social History:  reports that he has been smoking cigarettes. He has a 10.00 pack-year smoking history. He has never used smokeless tobacco. He reports that he does not drink alcohol  and does not use drugs.  ROS: All other review of systems were reviewed and are negative except what is noted above in HPI  Physical Exam: BP 110/66   Pulse (!) 55   Constitutional:  Alert and oriented, No acute distress. HEENT: [place] AT, moist mucus membranes.  Trachea midline, no masses. Cardiovascular: No clubbing, cyanosis, or edema. Respiratory: Normal respiratory effort, no increased work of breathing. GI: Abdomen is soft, nontender, nondistended, no abdominal masses GU: No CVA tenderness.  Lymph: No cervical or inguinal lymphadenopathy. Skin: No rashes, bruises or suspicious lesions. Neurologic: Grossly intact, no focal deficits, moving all 4 extremities. Psychiatric: Normal mood and affect.  Laboratory Data: Lab Results  Component Value Date   WBC 7.5 10/28/2020   HGB 14.4 10/28/2020   HCT 39.8 10/28/2020   MCV 83 10/28/2020   PLT 303 10/28/2020    Lab Results  Component Value Date   CREATININE 0.80 10/28/2020    No results found for: "PSA"  No results found  for: "TESTOSTERONE"  No results found for: "HGBA1C"  Urinalysis    Component Value Date/Time   COLORURINE YELLOW 10/26/2011 1025   APPEARANCEUR Clear 09/02/2021 1406   LABSPEC 1.028 10/26/2011 1025   PHURINE 6.5 10/26/2011 1025   GLUCOSEU Negative 09/02/2021 1406   HGBUR NEGATIVE 10/26/2011 1025   BILIRUBINUR Negative 09/02/2021 1406   KETONESUR NEGATIVE 10/26/2011 1025   PROTEINUR Negative 09/02/2021 1406   PROTEINUR NEGATIVE 10/26/2011 1025   UROBILINOGEN 0.2 05/05/2021 1030   UROBILINOGEN 0.2 10/26/2011 1025   NITRITE Negative 09/02/2021 1406   NITRITE NEGATIVE 10/26/2011 1025   LEUKOCYTESUR Negative 09/02/2021 1406    Lab Results  Component Value Date   LABMICR Comment 09/02/2021   WBCUA 0-5 08/08/2018   LABEPIT None seen 08/08/2018   MUCUS Present 08/08/2018   BACTERIA None seen 08/08/2018    Pertinent Imaging:  No results found for this or any previous visit.  No results found for this or any previous visit.  No results found for this or any previous visit.  No results found for this or any previous visit.  No results found for this or any previous visit.  No valid procedures specified. No results found for this or any previous visit.  No results found for this or any previous visit.   Assessment & Plan:    1. Prostate cancer ([place]) Followup 6 months with PSA - Urinalysis, Routine w reflex microscopic  2. Benign localized prostatic hyperplasia with lower urinary tract symptoms (LUTS) -uroxatral '10mg'$  qhs  3. Urinary frequency Uroxatral '10mg'$  qhs   No follow-ups on file.  Nicolette Bang, MD  [facility] [place]

## 2022-02-23 ENCOUNTER — Telehealth: Payer: Self-pay

## 2022-02-23 NOTE — Telephone Encounter (Signed)
Patient called for clarification on rx, is he supposed to d/c tamsulosin rx.  Please advise.

## 2022-02-23 NOTE — Telephone Encounter (Signed)
Verbal from Dr. Alyson Ingles for patient to d/c tamsulosin and take the uroxatral.  Patient voiced understanding.

## 2022-02-24 ENCOUNTER — Other Ambulatory Visit: Payer: 59

## 2022-03-03 ENCOUNTER — Ambulatory Visit: Payer: PRIVATE HEALTH INSURANCE | Admitting: Urology

## 2022-03-09 ENCOUNTER — Ambulatory Visit: Payer: PRIVATE HEALTH INSURANCE | Admitting: Urology

## 2022-03-16 ENCOUNTER — Other Ambulatory Visit: Payer: Self-pay | Admitting: Student

## 2022-03-16 DIAGNOSIS — R569 Unspecified convulsions: Secondary | ICD-10-CM

## 2022-04-01 ENCOUNTER — Other Ambulatory Visit: Payer: Self-pay

## 2022-04-01 DIAGNOSIS — R569 Unspecified convulsions: Secondary | ICD-10-CM

## 2022-04-01 MED ORDER — PHENYTOIN SODIUM EXTENDED 300 MG PO CAPS: 300.0000 mg | ORAL_CAPSULE | Freq: Every day | ORAL | 3 refills | Status: DC

## 2022-05-06 ENCOUNTER — Ambulatory Visit: Payer: 59

## 2022-05-06 VITALS — BP 110/64 | HR 57 | Temp 98.3°F | Wt 129.2 lb

## 2022-05-06 DIAGNOSIS — E7800 Pure hypercholesterolemia, unspecified: Secondary | ICD-10-CM | POA: Diagnosis not present

## 2022-05-06 DIAGNOSIS — E785 Hyperlipidemia, unspecified: Secondary | ICD-10-CM | POA: Diagnosis not present

## 2022-05-06 DIAGNOSIS — F17210 Nicotine dependence, cigarettes, uncomplicated: Secondary | ICD-10-CM | POA: Diagnosis not present

## 2022-05-06 DIAGNOSIS — M05731 Rheumatoid arthritis with rheumatoid factor of right wrist without organ or systems involvement: Secondary | ICD-10-CM | POA: Diagnosis not present

## 2022-05-06 DIAGNOSIS — Z23 Encounter for immunization: Secondary | ICD-10-CM | POA: Diagnosis not present

## 2022-05-06 DIAGNOSIS — Z79631 Long term (current) use of antimetabolite agent: Secondary | ICD-10-CM | POA: Diagnosis not present

## 2022-05-06 DIAGNOSIS — Z720 Tobacco use: Secondary | ICD-10-CM

## 2022-05-06 DIAGNOSIS — Z5181 Encounter for therapeutic drug level monitoring: Secondary | ICD-10-CM

## 2022-05-06 MED ORDER — NICOTINE POLACRILEX 2 MG MT LOZG: 2.0000 mg | LOZENGE | OROMUCOSAL | 0 refills | Status: AC | PRN

## 2022-05-06 MED ORDER — NICOTINE 14 MG/24HR TD PT24: 14.0000 mg | MEDICATED_PATCH | TRANSDERMAL | 0 refills | Status: AC

## 2022-05-06 NOTE — Progress Notes (Signed)
Established Patient Office Visit  Subjective   Patient ID: Bill [name], male    DOB: 1961-01-06  Age: 62 y.o. MRN: [id number]  Chief Complaint  Patient presents with   Medication Refill   Immunizations    TDAP    Bill [name] is a 62 y/o male with a pmh outlined below. Please see A&P for HPI information.  Medication Refill      Review of Systems  All other systems reviewed and are negative.     Objective:     BP 110/64 (BP Location: Right Arm, Patient Position: Sitting, Cuff Size: Normal)   Pulse (!) 57   Temp 98.3 F (36.8 C) (Oral)   Wt 129 lb 3.2 oz (58.6 kg)   SpO2 99%   BMI 20.24 kg/m    Physical Exam Constitutional:      General: He is not in acute distress.    Appearance: Normal appearance. He is normal weight.  Eyes:     General: No scleral icterus.    Extraocular Movements: Extraocular movements intact.     Conjunctiva/sclera: Conjunctivae normal.  Cardiovascular:     Rate and Rhythm: Normal rate and regular rhythm.     Pulses: Normal pulses.     Heart sounds: Normal heart sounds. No murmur heard.    No gallop.  Pulmonary:     Effort: Pulmonary effort is normal. No respiratory distress.     Breath sounds: Normal breath sounds. No wheezing or rales.  Musculoskeletal:     Right lower leg: No edema.     Left lower leg: No edema.     Comments: Swan neck deformity of R wrist. R 5th digit bouchard nodule. R 1st MCP joint swelling and R 1st digit boutonniere deformity. No effusions. No ptp.  Skin:    General: Skin is warm and dry.     Capillary Refill: Capillary refill takes less than 2 seconds.  Neurological:     Mental Status: He is alert.      No results found for any visits on 05/06/22.    The 10-year ASCVD risk score (Arnett DK, et al., 2019) is: 10.4%    Assessment & Plan:   Problem List Items Addressed This Visit       Musculoskeletal and Integument   Rheumatoid arthritis ([name]) - Primary    Long term use of methotrexate,  last CBC and CMP 2022. Will repeat today. Has not seen rheumatologist in years. Encouraged him to follow up. Denies joint pain at this time. Swan neck deformity of R wrist. R 5th digit bouchard nodule. R 1st MCP joint swelling and R 1st digit boutonniere deformity. No effusions. No ptp. -F/u CBC,CMP -patient to call rheumatologist for f/u      Relevant Orders   CMP14 + Anion Gap   CBC with Diff   Lipid Profile     Other   Tobacco abuse (Chronic)    Patient previously seen in [facility] 01/2022 for tobacco cessation. Says he used patches for about a month and stopped smoking for a week. Stress of his job caused him to resume. Now smoking 1/2 PPD similar to prior. Will trila patches and lozenges for 6 weeks and have him follow up. Can try chantix if not successful. No wellbutrin given seizure disorder. Gave [id number] number to get patches and lozenges if insurance doesn't cover. -Begin with step 2 (14 mg/day) for 6 weeks, followed by step 3 (7 mg/day) for 2 weeks.  -Weeks 1 to 6: 1 lozenge  every 1 to 2 hours (maximum: 5 lozenges every 6 hours; 20 lozenges/day); to increase chances of quitting, use at least 9 lozenges/day during the first 6 weeks. Weeks 7 to 9: 1 lozenge every 2 to 4 hours (maximum: 5 lozenges every 6 hours; 20 lozenges/day). Weeks 10 to 12: 1 lozenge every 4 to 8 hours (maximum: 5 lozenges every 6 hours; 20 lozenges/day).       Relevant Medications   nicotine (NICODERM CQ - DOSED IN MG/24 HOURS) 14 mg/24hr patch   nicotine polacrilex (NICOTINE MINI) 2 MG lozenge   Hyperlipidemia    Most recent LDL 99 8/22. ASCVD risk at that time 10.4%. No HTN or diabetes but does smoke and has inflammatory risk factor of RA. Will repeat lipid panel and can do shared decision making for starting statin. -F/u LDL, recalculate ASCVD      Other Visit Diagnoses     Methotrexate, long term, current use       Relevant Orders   CMP14 + Anion Gap   CBC with Diff   Encounter for monitoring of  methotrexate therapy       Relevant Orders   CMP14 + Anion Gap   CBC with Diff   Hyperlipidemia LDL goal <100       Relevant Orders   Lipid Profile       No follow-ups on file.    Iona Coach, MD

## 2022-05-06 NOTE — Assessment & Plan Note (Signed)
Most recent LDL 99 8/22. ASCVD risk at that time 10.4%. No HTN or diabetes but does smoke and has inflammatory risk factor of RA. Will repeat lipid panel and can do shared decision making for starting statin. -F/u LDL, recalculate ASCVD

## 2022-05-06 NOTE — Addendum Note (Signed)
Addended by: Marcelino Duster on: 05/06/2022 11:33 AM   Modules accepted: Orders

## 2022-05-06 NOTE — Patient Instructions (Addendum)
Thank you, [name] for allowing [name] to provide your care today. Today we discussed :  Tobacco use:  -Use one '14mg'$  nicotine patch daily for 6 weeks -1 lozenge every 1 to 2 hours (maximum: 5 lozenges every 6 hours; 20 lozenges/day); to increase chances of quitting, use at least 9 lozenges/day during the first 6 weeks.   RA: Please call and make an appointment with your rheumatologist. We are checking your kidney/ liver function and cbc on this medication today.  Cholesterol: We will check your cholesterol.  I have ordered the following labs for you:   Lab Orders         CMP14 + Anion Gap         CBC with Diff         Lipid Profile          I have ordered the following medication/changed the following medications:   Stop the following medications: Medications Discontinued During This Encounter  Medication Reason   nicotine (NICODERM CQ - DOSED IN MG/24 HOURS) 14 mg/24hr patch Reorder   nicotine polacrilex (NICOTINE MINI) 2 MG lozenge Reorder     Start the following medications: Meds ordered this encounter  Medications   nicotine (NICODERM CQ - DOSED IN MG/24 HOURS) 14 mg/24hr patch    Sig: Place 1 patch (14 mg total) onto the skin daily.    Dispense:  42 patch    Refill:  0   nicotine polacrilex (NICOTINE MINI) 2 MG lozenge    Sig: Take 1 lozenge (2 mg total) by mouth as needed for smoking cessation.    Dispense:  100 tablet    Refill:  0     Follow up:  6 weeks      We look forward to seeing you next time. Please call our clinic at [phone] if you have any questions or concerns. The best time to call is Monday-Friday from 9am-4pm, but there is someone available 24/7. If after hours or the weekend, call the main hospital number and ask for the Internal Medicine Resident On-Call. If you need medication refills, please notify your pharmacy one week in advance and they will send [name] a request.   Thank you for trusting me with your care. Wishing you the  best!   Iona Coach, MD [place]

## 2022-05-06 NOTE — Assessment & Plan Note (Addendum)
Patient previously seen in [facility] 01/2022 for tobacco cessation. Says he used patches for about a month and stopped smoking for a week. Stress of his job caused him to resume. Now smoking 1/2 PPD similar to prior. Will trila patches and lozenges for 6 weeks and have him follow up. Can try chantix if not successful. No wellbutrin given seizure disorder. Gave [id number] number to get patches and lozenges if insurance doesn't cover. -Begin with step 2 (14 mg/day) for 6 weeks, followed by step 3 (7 mg/day) for 2 weeks.  -Weeks 1 to 6: 1 lozenge every 1 to 2 hours (maximum: 5 lozenges every 6 hours; 20 lozenges/day); to increase chances of quitting, use at least 9 lozenges/day during the first 6 weeks. Weeks 7 to 9: 1 lozenge every 2 to 4 hours (maximum: 5 lozenges every 6 hours; 20 lozenges/day). Weeks 10 to 12: 1 lozenge every 4 to 8 hours (maximum: 5 lozenges every 6 hours; 20 lozenges/day).

## 2022-05-06 NOTE — Progress Notes (Deleted)
Tubular adenomsa Repeat colonoscopy 05/2020 normal=> due in 10 years  RA Follows w [place] rheum Plan - Continue methotrexate and folic acid - Contact information given for [facility] for follow up - CMP, CBC   Prostate cancer Follows [facility] PSA increasing 05/2021 D/c Flomax 0.4 ,begin uroxatral(alfuzosin)'10mg'$  qhs  CT lung cancer screening-not done  Seizures 02/2022:No seizures for the past 8-9 years. Still taking dilantin but has not seen neurology in years. Stressed importance of follow up. Previously seen at [facility]. Referral placed for follow up today. phenytoin  TUD 01/2022:Smoking about 1/2 a pack a days. Is not currently using nicotine patches or lozenges. Would like to restart this.  -nicotine 14 mg patches and '2mg'$  lozenges ordered  -continue to encourage cessation    Weeks 1 to 6: 1 lozenge every 1 to 2 hours (maximum: 5 lozenges every 6 hours; 20 lozenges/day); to increase chances of quitting, use at least 9 lozenges/day during the first 6 weeks. Weeks 7 to 9: 1 lozenge every 2 to 4 hours (maximum: 5 lozenges every 6 hours; 20 lozenges/day). Weeks 10 to 12: 1 lozenge every 4 to 8 hours (maximum: 5 lozenges every 6 hours; 20 lozenges/day).   ?10 cigarettes/day: Begin with step 2 (14 mg/day) for 6 weeks, followed by step 3 (7 mg/day) for 2 weeks.   HLD LDL 99 8/22=> 10.4% ASCVD risk with RA

## 2022-05-06 NOTE — Assessment & Plan Note (Signed)
Long term use of methotrexate, last CBC and CMP 2022. Will repeat today. Has not seen rheumatologist in years. Encouraged him to follow up. Denies joint pain at this time. Swan neck deformity of R wrist. R 5th digit bouchard nodule. R 1st MCP joint swelling and R 1st digit boutonniere deformity. No effusions. No ptp. -F/u CBC,CMP -patient to call rheumatologist for f/u

## 2022-05-08 LAB — CMP14 + ANION GAP
ALT: 19 IU/L (ref 0–44)
AST: 19 IU/L (ref 0–40)
Albumin/Globulin Ratio: 1 — ABNORMAL LOW (ref 1.2–2.2)
Albumin: 3.4 g/dL — ABNORMAL LOW (ref 3.9–4.9)
Alkaline Phosphatase: 150 IU/L — ABNORMAL HIGH (ref 44–121)
Anion Gap: 17 mmol/L (ref 10.0–18.0)
BUN/Creatinine Ratio: 14 (ref 10–24)
BUN: 12 mg/dL (ref 8–27)
Bilirubin Total: <0.2 mg/dL (ref 0.0–1.2)
CO2: 21 mmol/L (ref 20–29)
Calcium: 8.5 mg/dL — ABNORMAL LOW (ref 8.6–10.2)
Chloride: 103 mmol/L (ref 96–106)
Creatinine, Ser: 0.86 mg/dL (ref 0.76–1.27)
Globulin, Total: 3.5 g/dL (ref 1.5–4.5)
Glucose: 85 mg/dL (ref 70–99)
Potassium: 3.9 mmol/L (ref 3.5–5.2)
Sodium: 141 mmol/L (ref 134–144)
Total Protein: 6.9 g/dL (ref 6.0–8.5)
eGFR: 98 mL/min/{1.73_m2} (ref >59)

## 2022-05-08 LAB — CBC WITH DIFFERENTIAL/PLATELET
Basophils Absolute: 0.1 10*3/uL (ref 0.0–0.2)
Basos: 2 %
EOS (ABSOLUTE): 1.3 10*3/uL — ABNORMAL HIGH (ref 0.0–0.4)
Eos: 20 %
Hematocrit: 38.1 % (ref 37.5–51.0)
Hemoglobin: 13.3 g/dL (ref 13.0–17.7)
Immature Grans (Abs): 0 10*3/uL (ref 0.0–0.1)
Immature Granulocytes: 0 %
Lymphocytes Absolute: 1.3 10*3/uL (ref 0.7–3.1)
Lymphs: 21 %
MCH: 29.6 pg (ref 26.6–33.0)
MCHC: 34.9 g/dL (ref 31.5–35.7)
MCV: 85 fL (ref 79–97)
Monocytes Absolute: 0.4 10*3/uL (ref 0.1–0.9)
Monocytes: 6 %
Neutrophils Absolute: 3.3 10*3/uL (ref 1.4–7.0)
Neutrophils: 51 %
Platelets: 264 10*3/uL (ref 150–450)
RBC: 4.5 x10E6/uL (ref 4.14–5.80)
RDW: 12.3 % (ref 11.6–15.4)
WBC: 6.4 10*3/uL (ref 3.4–10.8)

## 2022-05-08 LAB — LIPID PANEL
Chol/HDL Ratio: 2.9 ratio (ref 0.0–5.0)
Cholesterol, Total: 180 mg/dL (ref 100–199)
HDL: 63 mg/dL (ref >39)
LDL Chol Calc (NIH): 100 mg/dL — ABNORMAL HIGH (ref 0–99)
Triglycerides: 91 mg/dL (ref 0–149)
VLDL Cholesterol Cal: 17 mg/dL (ref 5–40)

## 2022-05-10 NOTE — Progress Notes (Signed)
Called and discussed results with patient. Performed CBC and CMP as medication monitoring on methotrexate and phenytoin. Liver, Kidneys, electrolytes all stable and no toxicity concerns. Blood counts only significant for asymptomatic eosinophilia to 1.3, below threshold of hypereosinophilia which would be 1.5. Unsure of the etiology at this time but can repeat CBC at next visit.Got lipid panel given prior high-normal LDL and inflammatory effects of RA. ASCVD currently 10.6% and LDL minimally elevated to 100. Can have shared decision making with patient about starting statin at follow up visit.

## 2022-05-10 NOTE — Progress Notes (Signed)
Internal Medicine Clinic Attending  Case discussed with Dr. Stann Mainland  At the time of the visit.  We reviewed the resident's history and exam and pertinent patient test results.  I agree with the assessment, diagnosis, and plan of care documented in the resident's note.

## 2022-05-14 ENCOUNTER — Telehealth: Payer: Self-pay | Admitting: Internal Medicine

## 2022-05-14 ENCOUNTER — Telehealth: Payer: Self-pay

## 2022-05-14 NOTE — Telephone Encounter (Signed)
Pt called for his  ( phenytoin (DILANTIN) 300 MG ER capsule ) refill when I checked  he still had refills at the pharmacy I asked have he called the pharmacy .Marland Kitchen Pt stated  that the tech keeps saying they will call him back and he is  completely out of his med  as of today .Marland Kitchen So I called the [facility]  and spoke with a rep who stated that reason  he has not gotten his meds was it was out of stock ... She transferred me to another tech who then said they jus received the truck today and it says on the list the med was in the truck and ask for pt to call back in 1 hour  to have his med  filled ... Pt was on the phone and agreed to call the [facility] back

## 2022-05-14 NOTE — Telephone Encounter (Signed)
Patient called back stating [facility] only has 100 mg capsules. He is advised to call [facility], or [facility] to see if they have 300 mg caps in stock. He will call back so Rx can be resent to different pharmacy. He is completely out of dilantin.

## 2022-05-14 NOTE — Telephone Encounter (Signed)
Call placed to patient. He states no pharmacy has 300 mg caps of dilantin in stock. States [facility] on [address] can give him 100 mg caps. He is advised to take 3 caps of 100 mg (total 300 mg) till they are able to receive 300 mg caps. He is advised to call on call Provider if he has any difficulties obtaining 100 mg caps.

## 2022-05-14 NOTE — Telephone Encounter (Signed)
Received call from patient asking about Dilantin refill. He was asking about picking up '100mg'$  Rx tomorrow and if the '100mg'$  X3 capsules would be okay. Patient will pick up his Dilantin Rx tomorrow from [facility] on [address].

## 2022-05-17 ENCOUNTER — Other Ambulatory Visit: Payer: Self-pay

## 2022-05-17 DIAGNOSIS — R569 Unspecified convulsions: Secondary | ICD-10-CM

## 2022-05-17 MED ORDER — PHENYTOIN SODIUM EXTENDED 100 MG PO CAPS: 300.0000 mg | ORAL_CAPSULE | Freq: Every day | ORAL | 3 refills | Status: DC

## 2022-05-17 NOTE — Telephone Encounter (Signed)
Incoming fax from pharmacy Message to prescriber: '300mg'$  phenytoin is on back order/ no longer being made. Please send alternative rx for 100 mg cs if possible. Thank you.

## 2022-06-10 ENCOUNTER — Encounter: Payer: Self-pay | Admitting: Student

## 2022-06-10 ENCOUNTER — Ambulatory Visit (INDEPENDENT_AMBULATORY_CARE_PROVIDER_SITE_OTHER): Payer: 59 | Admitting: Urology

## 2022-06-10 ENCOUNTER — Other Ambulatory Visit: Payer: Self-pay | Admitting: Internal Medicine

## 2022-06-10 ENCOUNTER — Telehealth: Payer: Self-pay

## 2022-06-10 ENCOUNTER — Ambulatory Visit: Payer: 59 | Admitting: Student

## 2022-06-10 VITALS — BP 118/61 | HR 65 | Wt 129.5 lb

## 2022-06-10 DIAGNOSIS — F17210 Nicotine dependence, cigarettes, uncomplicated: Secondary | ICD-10-CM

## 2022-06-10 DIAGNOSIS — R39198 Other difficulties with micturition: Secondary | ICD-10-CM | POA: Diagnosis not present

## 2022-06-10 DIAGNOSIS — E7800 Pure hypercholesterolemia, unspecified: Secondary | ICD-10-CM

## 2022-06-10 DIAGNOSIS — R569 Unspecified convulsions: Secondary | ICD-10-CM

## 2022-06-10 DIAGNOSIS — M069 Rheumatoid arthritis, unspecified: Secondary | ICD-10-CM | POA: Diagnosis not present

## 2022-06-10 DIAGNOSIS — R300 Dysuria: Secondary | ICD-10-CM | POA: Diagnosis not present

## 2022-06-10 DIAGNOSIS — E785 Hyperlipidemia, unspecified: Secondary | ICD-10-CM

## 2022-06-10 DIAGNOSIS — M05731 Rheumatoid arthritis with rheumatoid factor of right wrist without organ or systems involvement: Secondary | ICD-10-CM

## 2022-06-10 DIAGNOSIS — Z720 Tobacco use: Secondary | ICD-10-CM

## 2022-06-10 MED ORDER — PHENYTOIN SODIUM EXTENDED 100 MG PO CAPS: 300.0000 mg | ORAL_CAPSULE | Freq: Every day | ORAL | 3 refills | Status: DC

## 2022-06-10 MED ORDER — ATORVASTATIN CALCIUM 20 MG PO TABS: 20.0000 mg | ORAL_TABLET | Freq: Every day | ORAL | 3 refills | Status: DC

## 2022-06-10 NOTE — Patient Instructions (Addendum)
It was a pleasure seeing you in clinic today  Please do no miss your rheumatology appointment   Please start atorvastatin 20 mg daily for cholesterol  I have ordered a low dose CT for lung cancer screening, you should be called about this  Please contact [facility] at Address: [address], [place], [place] [address] Phone: [phone] I have made a referral to neurology to see if a different practice will see you as well.  Follow up in 3 months

## 2022-06-10 NOTE — Progress Notes (Signed)
Established Patient Office Visit  Subjective   Patient ID: Bill [name], male    DOB: 1960-08-06  Age: 62 y.o. MRN: [id number]  No chief complaint on file.   Bill [name] is a 62 y.o. person living with a history listed below who presents to clinic for follow up of hyperlipidemia. Please refer to problem based charting for further details and assessment and plan of current problem and chronic medical conditions.     Patient Active Problem List   Diagnosis Date Noted   Benign localized prostatic hyperplasia with lower urinary tract symptoms (LUTS) 05/15/2021   Weight loss 10/31/2020   Elevated alkaline phosphatase level secondary to phenytoin  08/23/2019   Rheumatoid arthritis 02/08/2019   Prostate cancer 08/09/2018   Encounter for screening for lung cancer 08/08/2018   Centrilobular emphysema 08/07/2018   Allergic rhinitis 06/15/2017   Hyperlipidemia 07/23/2015   Tubular adenoma of colon 08/28/2014   Need for immunization against influenza 02/21/2012   Tobacco abuse 02/21/2012   Avascular necrosis of bone of right hip 02/25/2006   Seizures 02/25/2006   Multiple lung nodules on CT 02/25/2006   ROS: negative as per HPI     Objective:     BP 118/61 (BP Location: Right Arm, Patient Position: Sitting, Cuff Size: Normal)   Pulse 65   Wt 129 lb 8 oz (58.7 kg)   BMI 20.28 kg/m  BP Readings from Last 3 Encounters:  06/10/22 118/61  05/06/22 110/64  02/19/22 110/66      Physical Exam Constitutional:      Appearance: Normal appearance. He is normal weight.  HENT:     Head: Normocephalic and atraumatic.     Mouth/Throat:     Mouth: Mucous membranes are moist.     Pharynx: Oropharynx is clear.  Eyes:     Extraocular Movements: Extraocular movements intact.     Conjunctiva/sclera: Conjunctivae normal.     Pupils: Pupils are equal, round, and reactive to light.  Cardiovascular:     Rate and Rhythm: Normal rate and regular rhythm.     Pulses: Normal pulses.      Heart sounds: No murmur heard. Pulmonary:     Effort: Pulmonary effort is normal.     Breath sounds: No rhonchi or rales.  Abdominal:     General: Abdomen is flat. Bowel sounds are normal. There is no distension.     Palpations: Abdomen is soft.     Tenderness: There is no abdominal tenderness.  Musculoskeletal:     Right lower leg: No edema.     Left lower leg: No edema.     Comments: boutonniere deformity of the R 4th and 5th fingers. Swan neck deformity of the right hand. Bony enlargement of the right wrist. No swelling, erythema or warmth   Skin:    General: Skin is warm and dry.     Capillary Refill: Capillary refill takes less than 2 seconds.  Neurological:     General: No focal deficit present.     Mental Status: He is alert and oriented to person, place, and time.  Psychiatric:        Mood and Affect: Mood normal.        Behavior: Behavior normal.      Results for orders placed or performed in visit on 06/10/22  Urine Culture   Specimen: Urine   UR  Result Value Ref Range   Urine Culture, Routine Final report    Organism ID, Bacteria Comment  Urinalysis, Routine w reflex microscopic  Result Value Ref Range   Specific Gravity, UA 1.025 1.005 - 1.030   pH, UA 5.5 5.0 - 7.5   Color, UA Yellow Yellow   Appearance Ur Clear Clear   Leukocytes,UA Negative Negative   Protein,UA Trace (A) Negative/Trace   Glucose, UA Negative Negative   Ketones, UA Negative Negative   RBC, UA Negative Negative   Bilirubin, UA Negative Negative   Urobilinogen, Ur 0.2 0.2 - 1.0 mg/dL   Nitrite, UA Negative Negative   Microscopic Examination Comment     Last lipids Lab Results  Component Value Date   CHOL 180 05/06/2022   HDL 63 05/06/2022   LDLCALC 100 (H) 05/06/2022   TRIG 91 05/06/2022   CHOLHDL 2.9 05/06/2022      The 10-year ASCVD risk score (Arnett DK, et al., 2019) is: 11.8%    Assessment & Plan:   Problem List Items Addressed This Visit       Musculoskeletal  and Integument   Rheumatoid arthritis    Has follow up with rheumatology next week. Remains on methotrexate. Reports some right hand and wrist stiffness. No signs of active synovitis. Encouraged him to follow up with rheumatology.         Other   Seizures (Chronic)    Unable to follow up with neurology, dismissed from [facility]. No recent seizures. Will place new referral to neurology.       Relevant Medications   phenytoin (DILANTIN) 100 MG ER capsule   Other Relevant Orders   Ambulatory referral to Neurology   Tobacco abuse - Primary (Chronic)    Shared Decision Making: Current smoker (1 ppd for 25 years) with approximately a 20 pack year history. The patient meets USPTF recommendations for lung cancer screening with low dose CT in persons age 62 - 62 yrs with a 30 pack year history who are currently smoking or have quit in the past 15 yrs. We have discussed the risks and benefits of screening, including but not limited to the following. Lung cancer screening might detect about one half of lung cancer at an early, curative stage. For the average screened person, the risk of dying from lung cancer will decrease from 1.7% to 1.4% with screening. About 1 in 4 screened patients will have a false positive result possibly leading to more radiation exposure, cost, anxiety, and / or invasive procedures. 95% of false positives will not lead to a diagnosis of cancer. After discussion, Bill [name] has decided to pursue lung cancer screening.    Did pick up nicotine patches but has not used them yet. Encouraged him to start this.       Relevant Orders   CT CHEST LUNG CA SCREEN LOW DOSE W/O CM   Hyperlipidemia    ASVCD 10.6% on recent lipid panel. Discussed risks and benefits of statin therapy and he would like to try this. Will start atorvastatin 20 mg daily.       Relevant Medications   atorvastatin (LIPITOR) 20 MG tablet    Return in about 3 months (around 09/09/2022).    Quincy Simmonds,  MD

## 2022-06-10 NOTE — Progress Notes (Signed)
Patient presents today with complaints of  Difficult urination.  UA and Culture done today.  Dr. Jeffie Pollock reviewed results and no treatment started .  Patient aware of MD recommendations and that we will reach out with culture results.    [id number], [place]

## 2022-06-10 NOTE — Telephone Encounter (Addendum)
Patient call and voiced that sometime he can not void. Patient states that he is taking his Alfuzosin at night daily. Patient is aware to come drop off urine and PVR to to evaulated if the patient is in urine retention today while provider in office. Patient voiced understanding. Patient urine was clear and reviewed with Dr. Jeffie Pollock. PVR 21 Per Dr. Jeffie Pollock patient need a sooner appt with Dr. Alyson Ingles to evaluated patient sxs further, patient is aware of MD recommendation. Patient is aware that someone will reach out to him once MD review and advised on the next steps. Patient voiced understanding

## 2022-06-11 LAB — URINALYSIS, ROUTINE W REFLEX MICROSCOPIC
Bilirubin, UA: NEGATIVE
Glucose, UA: NEGATIVE
Ketones, UA: NEGATIVE
Leukocytes,UA: NEGATIVE
Nitrite, UA: NEGATIVE
RBC, UA: NEGATIVE
Specific Gravity, UA: 1.025 (ref 1.005–1.030)
Urobilinogen, Ur: 0.2 mg/dL (ref 0.2–1.0)
pH, UA: 5.5 (ref 5.0–7.5)

## 2022-06-12 LAB — URINE CULTURE

## 2022-06-15 ENCOUNTER — Encounter: Payer: Self-pay | Admitting: Student

## 2022-06-15 NOTE — Assessment & Plan Note (Signed)
ASVCD 10.6% on recent lipid panel. Discussed risks and benefits of statin therapy and he would like to try this. Will start atorvastatin 20 mg daily.

## 2022-06-15 NOTE — Assessment & Plan Note (Signed)
Has follow up with rheumatology next week. Remains on methotrexate. Reports some right hand and wrist stiffness. No signs of active synovitis. Encouraged him to follow up with rheumatology.

## 2022-06-15 NOTE — Assessment & Plan Note (Signed)
Shared Decision Making: Current smoker (1 ppd for 25 years) with approximately a 20 pack year history. The patient meets USPTF recommendations for lung cancer screening with low dose CT in persons age 62 - 33 yrs with a 30 pack year history who are currently smoking or have quit in the past 15 yrs. We have discussed the risks and benefits of screening, including but not limited to the following. Lung cancer screening might detect about one half of lung cancer at an early, curative stage. For the average screened person, the risk of dying from lung cancer will decrease from 1.7% to 1.4% with screening. About 1 in 4 screened patients will have a false positive result possibly leading to more radiation exposure, cost, anxiety, and / or invasive procedures. 95% of false positives will not lead to a diagnosis of cancer. After discussion, Bill [name] has decided to pursue lung cancer screening.    Did pick up nicotine patches but has not used them yet. Encouraged him to start this.

## 2022-06-15 NOTE — Assessment & Plan Note (Signed)
Unable to follow up with neurology, dismissed from [facility]. No recent seizures. Will place new referral to neurology.

## 2022-06-16 DIAGNOSIS — Z6820 Body mass index (BMI) 20.0-20.9, adult: Secondary | ICD-10-CM | POA: Diagnosis not present

## 2022-06-16 DIAGNOSIS — M25531 Pain in right wrist: Secondary | ICD-10-CM | POA: Diagnosis not present

## 2022-06-16 DIAGNOSIS — Z79899 Other long term (current) drug therapy: Secondary | ICD-10-CM | POA: Diagnosis not present

## 2022-06-16 DIAGNOSIS — M0579 Rheumatoid arthritis with rheumatoid factor of multiple sites without organ or systems involvement: Secondary | ICD-10-CM | POA: Diagnosis not present

## 2022-06-16 NOTE — Progress Notes (Signed)
Internal Medicine Clinic Attending  Case discussed with Dr. Liang  at the time of the visit.  We reviewed the resident's history and exam and pertinent patient test results.  I agree with the assessment, diagnosis, and plan of care documented in the resident's note.  

## 2022-07-05 DIAGNOSIS — F1021 Alcohol dependence, in remission: Secondary | ICD-10-CM | POA: Diagnosis not present

## 2022-07-05 DIAGNOSIS — N400 Enlarged prostate without lower urinary tract symptoms: Secondary | ICD-10-CM | POA: Diagnosis not present

## 2022-07-05 DIAGNOSIS — E785 Hyperlipidemia, unspecified: Secondary | ICD-10-CM | POA: Diagnosis not present

## 2022-07-05 DIAGNOSIS — F17210 Nicotine dependence, cigarettes, uncomplicated: Secondary | ICD-10-CM | POA: Diagnosis not present

## 2022-07-05 DIAGNOSIS — N181 Chronic kidney disease, stage 1: Secondary | ICD-10-CM | POA: Diagnosis not present

## 2022-07-05 DIAGNOSIS — G40909 Epilepsy, unspecified, not intractable, without status epilepticus: Secondary | ICD-10-CM | POA: Diagnosis not present

## 2022-07-05 DIAGNOSIS — M069 Rheumatoid arthritis, unspecified: Secondary | ICD-10-CM | POA: Diagnosis not present

## 2022-07-05 DIAGNOSIS — Z79631 Long term (current) use of antimetabolite agent: Secondary | ICD-10-CM | POA: Diagnosis not present

## 2022-07-05 DIAGNOSIS — Z96649 Presence of unspecified artificial hip joint: Secondary | ICD-10-CM | POA: Diagnosis not present

## 2022-07-05 DIAGNOSIS — R32 Unspecified urinary incontinence: Secondary | ICD-10-CM | POA: Diagnosis not present

## 2022-07-21 ENCOUNTER — Ambulatory Visit: Payer: 59 | Admitting: Urology

## 2022-07-21 VITALS — BP 117/66 | HR 83

## 2022-07-21 DIAGNOSIS — R39198 Other difficulties with micturition: Secondary | ICD-10-CM | POA: Diagnosis not present

## 2022-07-21 LAB — URINALYSIS, ROUTINE W REFLEX MICROSCOPIC
Bilirubin, UA: NEGATIVE
Glucose, UA: NEGATIVE
Ketones, UA: NEGATIVE
Leukocytes,UA: NEGATIVE
Nitrite, UA: NEGATIVE
Protein,UA: NEGATIVE
RBC, UA: NEGATIVE
Specific Gravity, UA: 1.025 (ref 1.005–1.030)
Urobilinogen, Ur: 0.2 mg/dL (ref 0.2–1.0)
pH, UA: 6 (ref 5.0–7.5)

## 2022-07-21 MED ORDER — FINASTERIDE 5 MG PO TABS
5.0000 mg | ORAL_TABLET | Freq: Every day | ORAL | 3 refills | Status: DC
Start: 2022-07-21 — End: 2022-10-29

## 2022-07-21 MED ORDER — ALFUZOSIN HCL ER 10 MG PO TB24: 10.0000 mg | ORAL_TABLET | Freq: Every day | ORAL | 11 refills | Status: DC

## 2022-07-21 MED ORDER — DOXYCYCLINE HYCLATE 100 MG PO CAPS: 100.0000 mg | ORAL_CAPSULE | Freq: Two times a day (BID) | ORAL | 0 refills | Status: AC

## 2022-07-21 MED ORDER — CIPROFLOXACIN HCL 500 MG PO TABS
500.0000 mg | ORAL_TABLET | Freq: Once | ORAL | Status: AC
Start: 2022-07-21 — End: 2022-07-21
  Administered 2022-07-21: 500 mg via ORAL

## 2022-07-21 NOTE — Progress Notes (Unsigned)
   07/21/22  CC:  Chief Complaint  Patient presents with   Cysto    HPI: Bill [name] is a 62yo here for cystoscopy for difficulty urinating Blood pressure 117/66, pulse 83. NED. A&Ox3.   No respiratory distress   Abd soft, NT, ND Normal phallus with bilateral descended testicles  Cystoscopy Procedure Note  Patient identification was confirmed, informed consent was obtained, and patient was prepped using Betadine solution.  Lidocaine jelly was administered per urethral meatus.     Pre-Procedure: - Inspection reveals a normal caliber ureteral meatus.  Procedure: The flexible cystoscope was introduced without difficulty - No urethral strictures/lesions are present. - Enlarged prostate erythematous prostatic urethra - Normal bladder neck - Bilateral ureteral orifices identified - Bladder mucosa  reveals no ulcers, tumors, or lesions - No bladder stones - No trabeculation    Post-Procedure: - Patient tolerated the procedure well  Assessment/ Plan: Doxycyclcine 100mg  BID for 28 days Finasteride 5mg  daily  No follow-ups on file.  Wilkie Aye, MD

## 2022-07-22 ENCOUNTER — Encounter: Payer: Self-pay | Admitting: Urology

## 2022-07-22 NOTE — Patient Instructions (Signed)

## 2022-07-29 ENCOUNTER — Telehealth: Payer: Self-pay

## 2022-07-29 NOTE — Telephone Encounter (Signed)
Tried returning call to patient with no answer. Left voiced message for return call.

## 2022-07-30 NOTE — Telephone Encounter (Signed)
Return call to patient. Patient states he was having a constant pain above his hip in his side. Patient states that he took a BC powder and the pain went away. Patient is made away that if the pain persist to give [name] a call back and if we are close to go to his nearest emergency department. Patient voiced understanding

## 2022-08-02 ENCOUNTER — Encounter: Payer: Self-pay | Admitting: *Deleted

## 2022-08-04 ENCOUNTER — Telehealth: Payer: Self-pay

## 2022-08-04 NOTE — Telephone Encounter (Signed)
Patient states he was having trouble urinating, his symptoms have since resolved and is having no issues at the moment.  Patient aware to call back if symptoms resume and we can have him come in for a sooner apt.

## 2022-08-09 ENCOUNTER — Other Ambulatory Visit: Payer: Commercial Managed Care - HMO

## 2022-08-16 ENCOUNTER — Ambulatory Visit: Payer: Commercial Managed Care - HMO | Admitting: Urology

## 2022-08-19 ENCOUNTER — Telehealth: Payer: Self-pay

## 2022-08-19 NOTE — Telephone Encounter (Signed)
Return call to patient. Patient states that he need a refilled on medication Doxycycline 100 mg. Per Dr. Ronne Binning note from 05/15 the Doxycycline 100 mg BID X 28 days. Patient made aware per Dr. Dimas Millin note on 05/15. Patient voiced understanding.

## 2022-08-24 DIAGNOSIS — M0579 Rheumatoid arthritis with rheumatoid factor of multiple sites without organ or systems involvement: Secondary | ICD-10-CM | POA: Diagnosis not present

## 2022-10-19 ENCOUNTER — Other Ambulatory Visit: Payer: 59

## 2022-10-19 DIAGNOSIS — C61 Malignant neoplasm of prostate: Secondary | ICD-10-CM | POA: Diagnosis not present

## 2022-10-29 ENCOUNTER — Ambulatory Visit: Payer: 59 | Admitting: Urology

## 2022-10-29 ENCOUNTER — Encounter: Payer: Self-pay | Admitting: Urology

## 2022-10-29 VITALS — BP 98/71 | HR 82

## 2022-10-29 DIAGNOSIS — R300 Dysuria: Secondary | ICD-10-CM | POA: Diagnosis not present

## 2022-10-29 DIAGNOSIS — N401 Enlarged prostate with lower urinary tract symptoms: Secondary | ICD-10-CM | POA: Diagnosis not present

## 2022-10-29 DIAGNOSIS — R350 Frequency of micturition: Secondary | ICD-10-CM

## 2022-10-29 DIAGNOSIS — R39198 Other difficulties with micturition: Secondary | ICD-10-CM

## 2022-10-29 DIAGNOSIS — C61 Malignant neoplasm of prostate: Secondary | ICD-10-CM

## 2022-10-29 LAB — URINALYSIS, ROUTINE W REFLEX MICROSCOPIC
Bilirubin, UA: NEGATIVE
Glucose, UA: NEGATIVE
Leukocytes,UA: NEGATIVE
Nitrite, UA: NEGATIVE
Protein,UA: NEGATIVE
RBC, UA: NEGATIVE
Specific Gravity, UA: 1.03 (ref 1.005–1.030)
Urobilinogen, Ur: 0.2 mg/dL (ref 0.2–1.0)
pH, UA: 5.5 (ref 5.0–7.5)

## 2022-10-29 MED ORDER — FINASTERIDE 5 MG PO TABS: 5.0000 mg | ORAL_TABLET | Freq: Every day | ORAL | 3 refills | Status: DC

## 2022-10-29 MED ORDER — ALFUZOSIN HCL ER 10 MG PO TB24: 10.0000 mg | ORAL_TABLET | Freq: Every day | ORAL | 11 refills | Status: DC

## 2022-10-29 NOTE — Progress Notes (Unsigned)
10/29/2022 12:24 PM   Richardson [name] 08-27-1960 [id number]  Referring provider: Quincy Simmonds, MD [address] [place],  [place] [address]  Followup prostate cancer and BPH   HPI: [name] is a 62yo here for followup for prostate cancer and BPH. Dysuria imporved after doxycycline. PSA decreased to 3.7 from 5.8. Patient is on finasteride and uroxatral. IPSS 22 QOl 5. He drinks several cups of coffee after 5pm.He has intermittent dysuria. His urine stream is strong when he takes the uroxatral.    PMH: Past Medical History:  Diagnosis Date   Alcohol abuse    Hx of , stopped in 2000. Had 12 pack for 25 years.    Arthritis    S/P RIGHT TOTAL HIP ARTHROPLASTY 2007 --PT HAS NOTICED PAIN RIGHT LEG--IS TOLD HE NEEDS RT HIP REVISION   Avascular necrosis of bone of hip (HCC) 2007   Right   Chronic chest pain    Normal stress test in 2003   History of tobacco abuse    Pleural plaque    MULTIPLE BILATERAL PLEURAL PLAQUES, which had not changed on CT as of 1/04   Seizure disorder (HCC)    Started at age 2. Last EMR record 05/25/09   Seizures (HCC)    PT THINKS [facility]  - LAST TIME PT THINKS WAS 2017    Surgical History: Past Surgical History:  Procedure Laterality Date   COLONOSCOPY  10/29/2014   Christella Hartigan   POLYPECTOMY     Right Hip Replacement  8/07 / 2013   Dr. Charlann Boxer    Home Medications:  Allergies as of 10/29/2022   No Known Allergies      Medication List        Accurate as of October 29, 2022 12:24 PM. If you have any questions, ask your nurse or doctor.          alfuzosin 10 MG 24 hr tablet Commonly known as: UROXATRAL Take 1 tablet (10 mg total) by mouth at bedtime.   atorvastatin 20 MG tablet Commonly known as: LIPITOR Take 1 tablet (20 mg total) by mouth daily.   doxycycline 100 MG capsule Commonly known as: VIBRAMYCIN Take 1 capsule (100 mg total) by mouth every 12 (twelve) hours.   famotidine 40 MG tablet Commonly known as: PEPCID Take 1 tablet  (40 mg total) by mouth at bedtime.   finasteride 5 MG tablet Commonly known as: PROSCAR Take 1 tablet (5 mg total) by mouth daily.   folic acid 1 MG tablet Commonly known as: FOLVITE Take 1 mg by mouth daily.   methotrexate 2.5 MG tablet Commonly known as: RHEUMATREX Take 2.5 mg by mouth once a week. 6 tablets at one time   nicotine polacrilex 2 MG lozenge Commonly known as: Nicotine Mini Take 1 lozenge (2 mg total) by mouth as needed for smoking cessation.   phenytoin 100 MG ER capsule Commonly known as: DILANTIN Take 3 capsules (300 mg total) by mouth daily.   VITAMIN D PO Take by mouth.        Allergies: No Known Allergies  Family History: Family History  Problem Relation Age of Onset   Heart attack Father        60s   Arthritis Mother    Seizures Brother    Colon cancer Neg Hx    Colon polyps Neg Hx    Esophageal cancer Neg Hx    Rectal cancer Neg Hx    Stomach cancer Neg Hx  Social History:  reports that he has been smoking cigarettes. He started smoking about 26 years ago. He has a 10 pack-year smoking history. He has never used smokeless tobacco. He reports that he does not drink alcohol and does not use drugs.  ROS: All other review of systems were reviewed and are negative except what is noted above in HPI  Physical Exam: BP 98/71   Pulse 82   Constitutional:  Alert and oriented, No acute distress. HEENT: [place] AT, moist mucus membranes.  Trachea midline, no masses. Cardiovascular: No clubbing, cyanosis, or edema. Respiratory: Normal respiratory effort, no increased work of breathing. GI: Abdomen is soft, nontender, nondistended, no abdominal masses GU: No CVA tenderness.  Lymph: No cervical or inguinal lymphadenopathy. Skin: No rashes, bruises or suspicious lesions. Neurologic: Grossly intact, no focal deficits, moving all 4 extremities. Psychiatric: Normal mood and affect.  Laboratory Data: Lab Results  Component Value Date   WBC 6.4  05/06/2022   HGB 13.3 05/06/2022   HCT 38.1 05/06/2022   MCV 85 05/06/2022   PLT 264 05/06/2022    Lab Results  Component Value Date   CREATININE 0.86 05/06/2022    No results found for: "PSA"  No results found for: "TESTOSTERONE"  No results found for: "HGBA1C"  Urinalysis    Component Value Date/Time   COLORURINE YELLOW 10/26/2011 1025   APPEARANCEUR Clear 07/21/2022 0842   LABSPEC 1.028 10/26/2011 1025   PHURINE 6.5 10/26/2011 1025   GLUCOSEU Negative 07/21/2022 0842   HGBUR NEGATIVE 10/26/2011 1025   BILIRUBINUR Negative 07/21/2022 0842   KETONESUR NEGATIVE 10/26/2011 1025   PROTEINUR Negative 07/21/2022 0842   PROTEINUR NEGATIVE 10/26/2011 1025   UROBILINOGEN 0.2 05/05/2021 1030   UROBILINOGEN 0.2 10/26/2011 1025   NITRITE Negative 07/21/2022 0842   NITRITE NEGATIVE 10/26/2011 1025   LEUKOCYTESUR Negative 07/21/2022 0842    Lab Results  Component Value Date   LABMICR Comment 07/21/2022   WBCUA 0-5 08/08/2018   LABEPIT None seen 08/08/2018   MUCUS Present 08/08/2018   BACTERIA None seen 08/08/2018    Pertinent Imaging: *** No results found for this or any previous visit.  No results found for this or any previous visit.  No results found for this or any previous visit.  No results found for this or any previous visit.  No results found for this or any previous visit.  No valid procedures specified. No results found for this or any previous visit.  No results found for this or any previous visit.   Assessment & Plan:    1. Difficulty in urination *** - Urinalysis, Routine w reflex microscopic  2. Dysuria *** - Urinalysis, Routine w reflex microscopic  3. Prostate cancer (HCC) *** - Urinalysis, Routine w reflex microscopic  4. Benign localized prostatic hyperplasia with lower urinary tract symptoms (LUTS) *** - Urinalysis, Routine w reflex microscopic  5. Urinary frequency *** - Urinalysis, Routine w reflex microscopic   No  follow-ups on file.  Wilkie Aye, MD  [facility] [place]

## 2022-11-01 ENCOUNTER — Encounter (INDEPENDENT_AMBULATORY_CARE_PROVIDER_SITE_OTHER): Payer: Self-pay

## 2022-11-03 ENCOUNTER — Encounter: Payer: Self-pay | Admitting: Urology

## 2022-11-03 NOTE — Patient Instructions (Signed)

## 2023-01-06 DIAGNOSIS — Z79899 Other long term (current) drug therapy: Secondary | ICD-10-CM | POA: Diagnosis not present

## 2023-01-06 DIAGNOSIS — M0579 Rheumatoid arthritis with rheumatoid factor of multiple sites without organ or systems involvement: Secondary | ICD-10-CM | POA: Diagnosis not present

## 2023-01-06 DIAGNOSIS — Z681 Body mass index (BMI) 19 or less, adult: Secondary | ICD-10-CM | POA: Diagnosis not present

## 2023-01-06 DIAGNOSIS — M25531 Pain in right wrist: Secondary | ICD-10-CM | POA: Diagnosis not present

## 2023-03-15 ENCOUNTER — Ambulatory Visit: Payer: 59 | Admitting: Internal Medicine

## 2023-03-15 VITALS — BP 104/60 | HR 70 | Temp 97.9°F | Ht 67.0 in | Wt 125.8 lb

## 2023-03-15 DIAGNOSIS — E785 Hyperlipidemia, unspecified: Secondary | ICD-10-CM | POA: Diagnosis not present

## 2023-03-15 DIAGNOSIS — R569 Unspecified convulsions: Secondary | ICD-10-CM

## 2023-03-15 DIAGNOSIS — E7800 Pure hypercholesterolemia, unspecified: Secondary | ICD-10-CM | POA: Diagnosis not present

## 2023-03-15 DIAGNOSIS — M05731 Rheumatoid arthritis with rheumatoid factor of right wrist without organ or systems involvement: Secondary | ICD-10-CM

## 2023-03-15 DIAGNOSIS — F17210 Nicotine dependence, cigarettes, uncomplicated: Secondary | ICD-10-CM

## 2023-03-15 DIAGNOSIS — M069 Rheumatoid arthritis, unspecified: Secondary | ICD-10-CM | POA: Diagnosis not present

## 2023-03-15 DIAGNOSIS — Z720 Tobacco use: Secondary | ICD-10-CM

## 2023-03-15 DIAGNOSIS — Z122 Encounter for screening for malignant neoplasm of respiratory organs: Secondary | ICD-10-CM

## 2023-03-15 MED ORDER — DICLOFENAC SODIUM 1 % EX GEL: 2.0000 g | Freq: Four times a day (QID) | CUTANEOUS | 2 refills | Status: AC | PRN

## 2023-03-15 NOTE — Assessment & Plan Note (Signed)
 Longstanding treatment with methotrexate, followed by rherumatology who he saw in November. Last CBC and BMP on record is from 04/2022, so rechecking labs today.   Reports a 1 mo history of R great toe and R knee pain that feels unlike his previous arthritis pain. When either of these joints hurt, he has difficulty walking due to pain. The great toe pain resolves in up to 2 hours with Goody powder, but he does not think NSAIDs help his knee. He describes the joints as very painful, but not red or swollen during these episodes. He had a uric acid wnl in 2021 and MRI from 2013 showed severe degenerative changes to the knee.  - Follow up with rheum in a few months - OTC NSAIDs for pain managements - Consider knee injections during flare - Follow up CBC, CMP - Continue methotrexate

## 2023-03-15 NOTE — Progress Notes (Deleted)
 HLD OP regimen is atorvastatin  20 mg daily which he is*** compliant with. Lipid panel last checked 04/2022 with LDL just above goal at 100.  Plan:Lipid panel today. Continue current regimen.  COPD Multiple lung nodules smoking  Tubular adenoma colon  RA OP regimen is methotrexate 2.5 mg weekly. He follows with rheumatology, last seen by their office ***.   BPPH Prostate cancer OP regimen includes finasteride  5 mg daily, alfuzosin  10 mg daily. He follows with urology, last seen by their office in August, plan for follow up around February to follow his PSA.   Seizures OP regimen includes phenytoin  300 mg daily. He has not had recent seizures. Last seen by neurology ***. Plan:    -low dose CT never done  -'forgot'--schedule before he leaves -never followed with neuro  -place referral    Toe pain? R great toe R knee

## 2023-03-15 NOTE — Assessment & Plan Note (Signed)
 Did not schedule low dose CT after last visit. Scheduling complicated by identifying a facility for this scan.

## 2023-03-15 NOTE — Assessment & Plan Note (Signed)
 Still has not followed up with neurology. Is adherent to 300 mg phenytoin daily and has not had seizures in a long time. New referral to neurology placed and counseled on the importance of this appointment.

## 2023-03-15 NOTE — Assessment & Plan Note (Signed)
 Currently prescribed atorvastatin 20 mg daily, which he reports he takes every other day. Last lipid panel on 05/06/2022 showed LDL just above goal at 100. Rechecking lipids today and reinforced importance of daily adherence.

## 2023-03-15 NOTE — Progress Notes (Signed)
 The care of the patient was discussed with Dr. Guillond and the assessment and plan was formulated with their assistance.  Please see their note for official documentation of the patient encounter.   Subjective:   Patient ID: Bill [name] male   DOB: [birth year] 63 y.o.   MRN: [id number]  HPI: [name] is a 63 y.o. male presenting for HLD, RA, and tobacco use disorder management.   Past Medical History:  Diagnosis Date   Alcohol abuse    Hx of , stopped in 2000. Had 12 pack for 25 years.    Arthritis    S/P RIGHT TOTAL HIP ARTHROPLASTY 2007 --PT HAS NOTICED PAIN RIGHT LEG--IS TOLD HE NEEDS RT HIP REVISION   Avascular necrosis of bone of hip (HCC) 2007   Right   Chronic chest pain    Normal stress test in 2003   History of tobacco abuse    Pleural plaque    MULTIPLE BILATERAL PLEURAL PLAQUES, which had not changed on CT as of 1/04   Seizure disorder (HCC)    Started at age 28. Last EMR record 05/25/09   Seizures (HCC)    PT THINKS [facility]  - LAST TIME PT THINKS WAS 2017   Current Outpatient Medications  Medication Sig Dispense Refill   diclofenac  Sodium (VOLTAREN ) 1 % GEL Apply 2 g topically 4 (four) times daily as needed. 150 g 2   alfuzosin  (UROXATRAL ) 10 MG 24 hr tablet Take 1 tablet (10 mg total) by mouth at bedtime. 30 tablet 11   atorvastatin  (LIPITOR) 20 MG tablet Take 1 tablet (20 mg total) by mouth daily. 90 tablet 3   doxycycline  (VIBRAMYCIN ) 100 MG capsule Take 1 capsule (100 mg total) by mouth every 12 (twelve) hours. 56 capsule 0   famotidine  (PEPCID ) 40 MG tablet Take 1 tablet (40 mg total) by mouth at bedtime. 30 tablet 1   finasteride  (PROSCAR ) 5 MG tablet Take 1 tablet (5 mg total) by mouth daily. 90 tablet 3   folic acid (FOLVITE) 1 MG tablet Take 1 mg by mouth daily.     methotrexate (RHEUMATREX) 2.5 MG tablet Take 2.5 mg by mouth once a week. 6 tablets at one time     nicotine  polacrilex (NICOTINE  MINI) 2 MG lozenge Take 1 lozenge (2 mg total)  by mouth as needed for smoking cessation. 100 tablet 0   phenytoin  (DILANTIN ) 100 MG ER capsule Take 3 capsules (300 mg total) by mouth daily. 270 capsule 3   VITAMIN D  PO Take by mouth. (Patient not taking: Reported on 10/29/2022)     No current facility-administered medications for this visit.   Family History  Problem Relation Age of Onset   Heart attack Father        30s   Arthritis Mother    Seizures Brother    Colon cancer Neg Hx    Colon polyps Neg Hx    Esophageal cancer Neg Hx    Rectal cancer Neg Hx    Stomach cancer Neg Hx    Social History   Socioeconomic History   Marital status: Single    Spouse name: Not on file   Number of children: Not on file   Years of education: Not on file   Highest education level: Not on file  Occupational History   Not on file  Tobacco Use   Smoking status: Every Day    Current packs/day: 0.00    Average packs/day: 0.5 packs/day for  20.0 years (10.0 ttl pk-yrs)    Types: Cigarettes    Start date: 03/08/1996    Last attempt to quit: 03/08/2016    Years since quitting: 7.0   Smokeless tobacco: Never  Vaping Use   Vaping status: Never Used  Substance and Sexual Activity   Alcohol use: No    Alcohol/week: 0.0 standard drinks of alcohol    Comment: Former   Drug use: No   Sexual activity: Yes    Birth control/protection: Condom    Comment: 1 Partner,   Other Topics Concern   Not on file  Social History Narrative   Custodian, single.    Social Drivers of Corporate Investment Banker Strain: Not on file  Food Insecurity: Food Insecurity Present (05/06/2022)   Hunger Vital Sign    Worried About Running Out of Food in the Last Year: Often true    Ran Out of Food in the Last Year: Often true  Transportation Needs: No Transportation Needs (05/06/2022)   PRAPARE - Administrator, Civil Service (Medical): No    Lack of Transportation (Non-Medical): No  Physical Activity: Not on file  Stress: Not on file  Social  Connections: Not on file   Review of Systems: Pertinent items noted in HPI and remainder of comprehensive ROS otherwise negative.  Objective:  Physical Exam: Vitals:   03/15/23 0903  BP: 104/60  Pulse: 70  Temp: 97.9 F (36.6 C)  TempSrc: Oral  SpO2: 98%  Weight: 125 lb 12.8 oz (57.1 kg)  Height: 5' 7 (1.702 m)   BP 104/60 (BP Location: Left Arm, Patient Position: Sitting, Cuff Size: Normal)   Pulse 70   Temp 97.9 F (36.6 C) (Oral)   Ht 5' 7 (1.702 m)   Wt 125 lb 12.8 oz (57.1 kg)   SpO2 98%   BMI 19.70 kg/m   General Appearance:    Alert, cooperative, no distress, appears stated age  Head:    Normocephalic, without obvious abnormality, atraumatic  Lungs:     Clear to auscultation bilaterally, respirations unlabored  Heart:    Regular rate and rhythm, S1 and S2 normal, no murmur, rub   or gallop  Extremities:   Extremities normal, atraumatic, no cyanosis or edema. R knee and foot not swollen, erythematous, or tender.   Skin:   Skin color, texture, turgor normal, no rashes or lesions  Neurologic:   Alert and oriented. Grossly normal strength, sensation throughout. Normal gait.    Assessment & Plan:  Seizures (HCC) Still has not followed up with neurology. Is adherent to 300 mg phenytoin  daily and has not had seizures in a long time. New referral to neurology placed and counseled on the importance of this appointment.   Hyperlipidemia Currently prescribed atorvastatin  20 mg daily, which he reports he takes every other day. Last lipid panel on 05/06/2022 showed LDL just above goal at 100. Rechecking lipids today and reinforced importance of daily adherence.   Rheumatoid arthritis (HCC) Longstanding treatment with methotrexate, followed by rherumatology who he saw in November. Last CBC and BMP on record is from 04/2022, so rechecking labs today.   Reports a 1 mo history of R great toe and R knee pain that feels unlike his previous arthritis pain. When either of these joints  hurt, he has difficulty walking due to pain. The great toe pain resolves in up to 2 hours with Goody powder, but he does not think NSAIDs help his knee. He describes the joints as  very painful, but not red or swollen during these episodes. He had a uric acid wnl in 2021 and MRI from 2013 showed severe degenerative changes to the knee.  - Follow up with rheum in a few months - OTC NSAIDs for pain managements - Consider knee injections during flare - Follow up CBC, CMP - Continue methotrexate   Encounter for screening for lung cancer Did not schedule low dose CT after last visit. Scheduling complicated by identifying a facility for this scan.  Tobacco abuse Is committed to quitting this year. Is down to about 7 cigarettes per day. Finds patches helpful but uses them intermittently. Is interested in potentially trying Chantix in the future.   Therisa Cramp, MS3

## 2023-03-15 NOTE — Assessment & Plan Note (Signed)
 Is committed to quitting this year. Is down to about 7 cigarettes per day. Finds patches helpful but uses them intermittently. Is interested in potentially trying Chantix in the future.

## 2023-03-15 NOTE — Patient Instructions (Signed)
 Thank you, Mr. HIROSHI KRUMMEL for allowing us  to provide your care today. Today we discussed:  Seizures: Please follow up with Neurology, as they are experts in seizures and seizure medications like your Dilantin .     Knee and toe pain: Try the Voltaren  gel when the pain flares up and make sure to talk to your rheumatologist about these episodes. You can also try wearing more supportive shoes or inserts for days when you walk and stand more often. Let us  know next time your knee pain flares if you would like a steroid injection and we can try to get you an appointment.   Cholesterol: Take your atorvastatin  (Lipitor) 20 mg every day!   Lungs: Please get the low-dose CT scan, which screens for lung cancer. We will let you know which facility you can schedule at.   Smoking: Keep working on cutting back on those cigarettes! I agree with you that that this can be the year you quit. It sounds like the patches are helping, so please keep using them regularly. Please also let us  know if you would like to try medication to reduce cravings.   Shingles vaccine: Unfortunately, we are not able to offer the shingles vaccine here. If you remember which office you got the first one, you can go back there or get it at the pharmacy.   We will call you if your blood work shows that your medications need adjustment.   I have ordered the following labs for you: Lab Orders         CBC no Diff         CMP14 + Anion Gap     Referrals ordered today:   Referral Orders         Ambulatory referral to Neurology        Follow up: 6 months or sooner if needed  We look forward to seeing you next time. Please call our clinic at [phone] if you have any questions or concerns. The best time to call is Monday-Friday from 9am-4pm, but there is someone available 24/7. If after hours or the weekend, call the main hospital number and ask for the Internal Medicine Resident On-Call. If you need medication refills, please  notify your pharmacy one week in advance and they will send us  a request.   Thank you for trusting me with your care. Wishing you the best!   Therisa Cramp, MS3

## 2023-03-16 LAB — CMP14 + ANION GAP
ALT: 15 [IU]/L (ref 0–44)
AST: 15 [IU]/L (ref 0–40)
Albumin: 3.5 g/dL — ABNORMAL LOW (ref 3.9–4.9)
Alkaline Phosphatase: 162 [IU]/L — ABNORMAL HIGH (ref 44–121)
Anion Gap: 10 mmol/L (ref 10.0–18.0)
BUN/Creatinine Ratio: 16 (ref 10–24)
BUN: 14 mg/dL (ref 8–27)
Bilirubin Total: <0.2 mg/dL (ref 0.0–1.2)
CO2: 26 mmol/L (ref 20–29)
Calcium: 9 mg/dL (ref 8.6–10.2)
Chloride: 103 mmol/L (ref 96–106)
Creatinine, Ser: 0.89 mg/dL (ref 0.76–1.27)
Globulin, Total: 3.7 g/dL (ref 1.5–4.5)
Glucose: 81 mg/dL (ref 70–99)
Potassium: 4.7 mmol/L (ref 3.5–5.2)
Sodium: 139 mmol/L (ref 134–144)
Total Protein: 7.2 g/dL (ref 6.0–8.5)
eGFR: 97 mL/min/{1.73_m2} (ref >59)

## 2023-03-16 LAB — CBC
Hematocrit: 39.3 % (ref 37.5–51.0)
Hemoglobin: 13.6 g/dL (ref 13.0–17.7)
MCH: 29.6 pg (ref 26.6–33.0)
MCHC: 34.6 g/dL (ref 31.5–35.7)
MCV: 86 fL (ref 79–97)
Platelets: 289 10*3/uL (ref 150–450)
RBC: 4.59 x10E6/uL (ref 4.14–5.80)
RDW: 13 % (ref 11.6–15.4)
WBC: 5.2 10*3/uL (ref 3.4–10.8)

## 2023-03-16 LAB — LIPID PANEL
Chol/HDL Ratio: 2.4 {ratio} (ref 0.0–5.0)
Cholesterol, Total: 155 mg/dL (ref 100–199)
HDL: 64 mg/dL (ref >39)
LDL Chol Calc (NIH): 77 mg/dL (ref 0–99)
Triglycerides: 71 mg/dL (ref 0–149)
VLDL Cholesterol Cal: 14 mg/dL (ref 5–40)

## 2023-03-17 NOTE — Progress Notes (Signed)
 Internal Medicine Clinic Attending  Case discussed with the resident at the time of the visit.  We reviewed the resident's history and exam and pertinent patient test results.  I agree with the assessment, diagnosis, and plan of care documented in the resident's note.

## 2023-04-07 DIAGNOSIS — M0579 Rheumatoid arthritis with rheumatoid factor of multiple sites without organ or systems involvement: Secondary | ICD-10-CM | POA: Diagnosis not present

## 2023-04-26 ENCOUNTER — Other Ambulatory Visit (INDEPENDENT_AMBULATORY_CARE_PROVIDER_SITE_OTHER): Payer: 59

## 2023-04-26 DIAGNOSIS — R300 Dysuria: Secondary | ICD-10-CM | POA: Diagnosis not present

## 2023-04-26 DIAGNOSIS — C61 Malignant neoplasm of prostate: Secondary | ICD-10-CM

## 2023-04-26 LAB — URINALYSIS, ROUTINE W REFLEX MICROSCOPIC
Bilirubin, UA: NEGATIVE
Glucose, UA: NEGATIVE
Leukocytes,UA: NEGATIVE
Nitrite, UA: NEGATIVE
Protein,UA: NEGATIVE
RBC, UA: NEGATIVE
Specific Gravity, UA: 1.03 (ref 1.005–1.030)
Urobilinogen, Ur: 1 mg/dL (ref 0.2–1.0)
pH, UA: 6 (ref 5.0–7.5)

## 2023-04-26 NOTE — Progress Notes (Unsigned)
Patient present in office today for lab psa and complained of pain and burning while voiding.  Dysuria  Patient c/o of dysuria x 1 week.  Pain: burning  Severity:5/10  Associated Signs and Symptoms:  Fever: noTemp. Chills: no Hematuria: no Urgency: no Frequency: no Hesitancy:no Incontinence: no Nausea: no Vomiting: no  Urologic History:  Any Recent Urologic Surgeries or Procedures:no Recurrent UTI's:no Cystitis: no  Prostatitis:no Kidney or Bladder Stones: no Plan: Walk-in Clinic: patient will do a urine drop off Appointment w/Physician: patient was here for a lab appointment today Lab visit scheduled for urine drop off: Yes Urine order added psa order today.   Do you take on daily medications for UTI suppression  Patient states he takes azo on and off. He took it last Sunday and it helped with the pain.     Urinalysis noted to be negative. Patient called and advised to continue azo as needed. Increase fluid intake and to keep follow up appointment with provider. Patient voiced understanding.

## 2023-04-27 LAB — PSA: Prostate Specific Ag, Serum: 2.7 ng/mL (ref 0.0–4.0)

## 2023-05-02 ENCOUNTER — Ambulatory Visit: Payer: 59 | Admitting: Urology

## 2023-05-02 VITALS — BP 110/57 | HR 65

## 2023-05-02 DIAGNOSIS — R350 Frequency of micturition: Secondary | ICD-10-CM

## 2023-05-02 DIAGNOSIS — N401 Enlarged prostate with lower urinary tract symptoms: Secondary | ICD-10-CM | POA: Diagnosis not present

## 2023-05-02 DIAGNOSIS — C61 Malignant neoplasm of prostate: Secondary | ICD-10-CM

## 2023-05-02 MED ORDER — ALFUZOSIN HCL ER 10 MG PO TB24
10.0000 mg | ORAL_TABLET | Freq: Every day | ORAL | 11 refills | Status: DC
Start: 2023-05-02 — End: 2023-12-14

## 2023-05-02 MED ORDER — FINASTERIDE 5 MG PO TABS: 5.0000 mg | ORAL_TABLET | Freq: Every day | ORAL | 3 refills | Status: DC

## 2023-05-02 MED ORDER — LEVOFLOXACIN 750 MG PO TABS: 750.0000 mg | ORAL_TABLET | Freq: Once | ORAL | 0 refills | Status: AC

## 2023-05-02 NOTE — Patient Instructions (Signed)
 Prostate Biopsy Instructions:  Appointment Time: 12:45  Appointment Date: 05/25/2023 Location: [facility]   Please note this procedure is completed at [facility] Department. Please arrive 15 minutes prior to your scheduled visit time to allow registration time. If you are late for your scheduled visit time, you may be asked to reschedule due to time restraints.  You will need to stop all aspirin or blood thinners (aspirin, plavix, coumadin, warfarin, motrin, ibuprofen, advil, aleve, naproxen, naprosyn) for 7 days prior to the procedure.  If you have any questions about stopping these medications, please contact your prescribing doctor.  Our office will obtain clearance for your blood thinners to be held- please do not stop these medications until you have been given instruction to do so.  Having a light meal prior to the procedure is recommended.  If you are diabetic or have low blood sugar please bring a small snack or glucose tablet.  A Fleets enema is needed to be purchased over the counter at a local pharmacy and used 2 hours before you scheduled appointment.  This can be purchased over the counter at any pharmacy.  One antibiotic tablet has been sent to your pharmacy. Please pick this prescription up and take 2 hours prior to your scheduled biopsy appointment. You will also be given an additional antibiotic injection at the biopsy appointment.   Please bring someone with you to the procedure to drive you home if you are given a valium to take prior to your procedure.   If you have any questions or concerns, please feel free to call the office at [phone] or send a Mychart message.  What you can expect after your prostate biopsy:  You may notice blood in your urine or stool up to 1 week after your procedure. You may notice blood in your semen up to 1 month after your procedure.  If you are unable to pee afterwards or develop a fever, please call  our office at [phone] and hit option 3 to speak with a clinical staff member.  Your physicain will notify you of your results.    Thank you, [facility]

## 2023-05-02 NOTE — Progress Notes (Unsigned)
 05/02/2023 3:40 PM   Bill [name] 1960/04/16 [id number]  Referring provider: Olegario Messier, MD [address] [place],  [place] [address]  Followup prostate cancer   HPI: Bill [name] is a 63yo here for followup for prostate cancer and BPH with urinary frequncy. Frequency is improved with uroxatral. IPSS 18 QOL 2 on uroxatral 10mg  at bedtime and finasteride 5mg  daily. PSA decreased to 2.7   PMH: Past Medical History:  Diagnosis Date   Alcohol abuse    Hx of , stopped in 2000. Had 12 pack for 25 years.    Arthritis    S/P RIGHT TOTAL HIP ARTHROPLASTY 2007 --PT HAS NOTICED PAIN RIGHT LEG--IS TOLD HE NEEDS RT HIP REVISION   Avascular necrosis of bone of hip (HCC) 2007   Right   Chronic chest pain    Normal stress test in 2003   History of tobacco abuse    Pleural plaque    MULTIPLE BILATERAL PLEURAL PLAQUES, which had not changed on CT as of 1/04   Seizure disorder (HCC)    Started at age 79. Last EMR record 05/25/09   Seizures (HCC)    PT THINKS GRAND MAL  - LAST TIME PT THINKS WAS 2017    Surgical History: Past Surgical History:  Procedure Laterality Date   COLONOSCOPY  10/29/2014   Christella Hartigan   POLYPECTOMY     Right Hip Replacement  8/07 / 2013   Dr. Charlann Boxer    Home Medications:  Allergies as of 05/02/2023   No Known Allergies      Medication List        Accurate as of May 02, 2023  3:40 PM. If you have any questions, ask your nurse or doctor.          alfuzosin 10 MG 24 hr tablet Commonly known as: UROXATRAL Take 1 tablet (10 mg total) by mouth at bedtime.   atorvastatin 20 MG tablet Commonly known as: LIPITOR Take 1 tablet (20 mg total) by mouth daily.   diclofenac Sodium 1 % Gel Commonly known as: Voltaren Apply 2 g topically 4 (four) times daily as needed.   doxycycline 100 MG capsule Commonly known as: VIBRAMYCIN Take 1 capsule (100 mg total) by mouth every 12 (twelve) hours.   famotidine 40 MG tablet Commonly known as: PEPCID Take 1  tablet (40 mg total) by mouth at bedtime.   finasteride 5 MG tablet Commonly known as: PROSCAR Take 1 tablet (5 mg total) by mouth daily.   folic acid 1 MG tablet Commonly known as: FOLVITE Take 1 mg by mouth daily.   methotrexate 2.5 MG tablet Commonly known as: RHEUMATREX Take 2.5 mg by mouth once a week. 6 tablets at one time   nicotine polacrilex 2 MG lozenge Commonly known as: Nicotine Mini Take 1 lozenge (2 mg total) by mouth as needed for smoking cessation.   phenytoin 100 MG ER capsule Commonly known as: DILANTIN Take 3 capsules (300 mg total) by mouth daily.   VITAMIN D PO Take by mouth.        Allergies: No Known Allergies  Family History: Family History  Problem Relation Age of Onset   Heart attack Father        76s   Arthritis Mother    Seizures Brother    Colon cancer Neg Hx    Colon polyps Neg Hx    Esophageal cancer Neg Hx    Rectal cancer Neg Hx    Stomach cancer Neg Hx  Social History:  reports that he has been smoking cigarettes. He started smoking about 27 years ago. He has a 10 pack-year smoking history. He has never used smokeless tobacco. He reports that he does not drink alcohol and does not use drugs.  ROS: All other review of systems were reviewed and are negative except what is noted above in HPI  Physical Exam: BP (!) 110/57   Pulse 65   Constitutional:  Alert and oriented, No acute distress. HEENT: [place] AT, moist mucus membranes.  Trachea midline, no masses. Cardiovascular: No clubbing, cyanosis, or edema. Respiratory: Normal respiratory effort, no increased work of breathing. GI: Abdomen is soft, nontender, nondistended, no abdominal masses GU: No CVA tenderness.  Lymph: No cervical or inguinal lymphadenopathy. Skin: No rashes, bruises or suspicious lesions. Neurologic: Grossly intact, no focal deficits, moving all 4 extremities. Psychiatric: Normal mood and affect.  Laboratory Data: Lab Results  Component Value Date    WBC 5.2 03/15/2023   HGB 13.6 03/15/2023   HCT 39.3 03/15/2023   MCV 86 03/15/2023   PLT 289 03/15/2023    Lab Results  Component Value Date   CREATININE 0.89 03/15/2023    No results found for: "PSA"  No results found for: "TESTOSTERONE"  No results found for: "HGBA1C"  Urinalysis    Component Value Date/Time   COLORURINE YELLOW 10/26/2011 1025   APPEARANCEUR Clear 04/26/2023 1405   LABSPEC 1.028 10/26/2011 1025   PHURINE 6.5 10/26/2011 1025   GLUCOSEU Negative 04/26/2023 1405   HGBUR NEGATIVE 10/26/2011 1025   BILIRUBINUR Negative 04/26/2023 1405   KETONESUR NEGATIVE 10/26/2011 1025   PROTEINUR Negative 04/26/2023 1405   PROTEINUR NEGATIVE 10/26/2011 1025   UROBILINOGEN 0.2 05/05/2021 1030   UROBILINOGEN 0.2 10/26/2011 1025   NITRITE Negative 04/26/2023 1405   NITRITE NEGATIVE 10/26/2011 1025   LEUKOCYTESUR Negative 04/26/2023 1405    Lab Results  Component Value Date   LABMICR Comment 04/26/2023   WBCUA 0-5 08/08/2018   LABEPIT None seen 08/08/2018   MUCUS Present 08/08/2018   BACTERIA None seen 08/08/2018    Pertinent Imaging:  No results found for this or any previous visit.  No results found for this or any previous visit.  No results found for this or any previous visit.  No results found for this or any previous visit.  No results found for this or any previous visit.  No results found for this or any previous visit.  No results found for this or any previous visit.  No results found for this or any previous visit.   Assessment & Plan:    1. Prostate cancer (HCC) (Primary) We will schedule for prostate biopsy - Urinalysis, Routine w reflex microscopic  2. Urinary frequency Continue uroxatral 10mg  qhs  3. Benign localized prostatic hyperplasia with lower urinary tract symptoms (LUTS) Continue finasteride 5mg  daily   No follow-ups on file.  Wilkie Aye, MD  [facility] [place]

## 2023-05-03 LAB — URINALYSIS, ROUTINE W REFLEX MICROSCOPIC
Bilirubin, UA: NEGATIVE
Glucose, UA: NEGATIVE
Ketones, UA: NEGATIVE
Leukocytes,UA: NEGATIVE
Nitrite, UA: NEGATIVE
Protein,UA: NEGATIVE
RBC, UA: NEGATIVE
Specific Gravity, UA: 1.01 (ref 1.005–1.030)
Urobilinogen, Ur: 0.2 mg/dL (ref 0.2–1.0)
pH, UA: 6 (ref 5.0–7.5)

## 2023-05-05 ENCOUNTER — Encounter: Payer: Self-pay | Admitting: Urology

## 2023-05-25 ENCOUNTER — Other Ambulatory Visit: Payer: Self-pay | Admitting: Urology

## 2023-05-25 ENCOUNTER — Ambulatory Visit (HOSPITAL_BASED_OUTPATIENT_CLINIC_OR_DEPARTMENT_OTHER): Payer: 59 | Admitting: Urology

## 2023-05-25 ENCOUNTER — Ambulatory Visit (HOSPITAL_COMMUNITY)
Admission: RE | Admit: 2023-05-25 | Discharge: 2023-05-25 | Disposition: A | Discharge status: Home / Self Care | Payer: 59 | Source: Ambulatory Visit | Attending: Urology | Admitting: Urology

## 2023-05-25 ENCOUNTER — Encounter (HOSPITAL_COMMUNITY): Payer: Self-pay

## 2023-05-25 VITALS — BP 99/55 | HR 62 | Temp 97.6°F | Resp 18

## 2023-05-25 DIAGNOSIS — D075 Carcinoma in situ of prostate: Secondary | ICD-10-CM | POA: Diagnosis not present

## 2023-05-25 DIAGNOSIS — N4231 Prostatic intraepithelial neoplasia: Secondary | ICD-10-CM

## 2023-05-25 DIAGNOSIS — C61 Malignant neoplasm of prostate: Secondary | ICD-10-CM | POA: Diagnosis not present

## 2023-05-25 DIAGNOSIS — N4232 Atypical small acinar proliferation of prostate: Secondary | ICD-10-CM | POA: Diagnosis not present

## 2023-05-25 DIAGNOSIS — N411 Chronic prostatitis: Secondary | ICD-10-CM

## 2023-05-25 MED ORDER — GENTAMICIN SULFATE 40 MG/ML IJ SOLN
80.0000 mg | Freq: Once | INTRAMUSCULAR | Status: AC
  Administered 2023-05-25: 80 mg via INTRAMUSCULAR

## 2023-05-25 MED ORDER — LIDOCAINE HCL (PF) 2 % IJ SOLN
INTRAMUSCULAR | Status: AC
  Filled 2023-05-25: qty 10

## 2023-05-25 MED ORDER — LIDOCAINE HCL (PF) 2 % IJ SOLN
10.0000 mL | Freq: Once | INTRAMUSCULAR | Status: AC
  Administered 2023-05-25: 10 mL

## 2023-05-25 MED ORDER — GENTAMICIN SULFATE 40 MG/ML IJ SOLN
INTRAMUSCULAR | Status: AC
  Filled 2023-05-25: qty 2

## 2023-05-25 NOTE — Progress Notes (Signed)
 PT tolerated prostate biopsy procedure and antibiotic injection well today. Labs obtained and sent for pathology. PT ambulatory at discharge with no acute distress noted and verbalized understanding of discharge instructions. PT to follow up with urologist as scheduled on 06/10/23 @1 :00pm.

## 2023-05-26 LAB — SURGICAL PATHOLOGY

## 2023-05-27 ENCOUNTER — Other Ambulatory Visit: Payer: Self-pay | Admitting: Student

## 2023-05-27 DIAGNOSIS — R569 Unspecified convulsions: Secondary | ICD-10-CM

## 2023-05-27 NOTE — Progress Notes (Signed)
 Prostate Biopsy Procedure   Informed consent was obtained after discussing risks/benefits of the procedure.  A time out was performed to ensure correct patient identity.  Pre-Procedure: - Last PSA Level: No results found for: "PSA" - Gentamicin given prophylactically - Levaquin 500 mg administered PO -Transrectal Ultrasound performed revealing a 74.3 gm prostate -No significant hypoechoic or median lobe noted  Procedure: - Prostate block performed using 10 cc 1% lidocaine and biopsies taken from sextant areas, a total of 12 under ultrasound guidance.  Post-Procedure: - Patient tolerated the procedure well - He was counseled to seek immediate medical attention if experiences any severe pain, significant bleeding, or fevers - Return in one week to discuss biopsy results

## 2023-05-27 NOTE — Patient Instructions (Signed)
 Transrectal Ultrasound-Guided Prostate Biopsy, Care After What can I expect after the procedure? After the procedure, it is common to have: Pain and discomfort near your butt (rectum), especially while sitting. Pink-colored pee (urine). This is due to small amounts of blood in your pee. A burning feeling while peeing. Blood in your poop (stool). Bleeding from your butt. Blood in your semen. Follow these instructions at home: Medicines Take over-the-counter and prescription medicines only as told by your doctor. If you were given a sedative during your procedure, do not drive or use machines until your doctor says that it is safe. A sedative is a medicine that helps you relax. If you were prescribed an antibiotic medicine, take it as told by your doctor. Do not stop taking it even if you start to feel better. Activity  Return to your normal activities when your doctor says that it is safe. Ask your doctor when it is okay for you to have sex. You may have to avoid lifting. Ask your doctor how much you can safely lift. General instructions  Drink enough water to keep your pee pale yellow. Watch your pee, poop, and semen for new bleeding or bleeding that gets worse. Keep all follow-up visits. Contact a doctor if: You have any of these: Blood clots in your pee or poop. Blood in your pee more than 2 weeks after the procedure. Blood in your semen more than 2 months after the procedure. New or worse bleeding in your pee, poop, or semen. Very bad belly pain. Your pee smells bad or unusual. You have trouble peeing. Your lower belly feels firm. You have problems getting an erection. You feel like you may vomit (are nauseous), or you vomit. Get help right away if: You have a fever or chills. You have bright red pee. You have very bad pain that does not get better with medicine. You cannot pee. Summary After this procedure, it is common to have pain and discomfort near your butt,  especially while sitting. You may have blood in your pee and poop. It is common to have blood in your semen. Get help right away if you have a fever or chills. This information is not intended to replace advice given to you by your health care provider. Make sure you discuss any questions you have with your health care provider. Document Revised: 08/18/2020 Document Reviewed: 08/18/2020 Elsevier Patient Education  2024 [employer].

## 2023-06-07 ENCOUNTER — Telehealth: Payer: Self-pay

## 2023-06-07 NOTE — Telephone Encounter (Signed)
 Patient called and made aware of negative prostate biopsy result. Patient rescheduled for 6 months with psa prior.

## 2023-06-09 ENCOUNTER — Other Ambulatory Visit

## 2023-06-10 ENCOUNTER — Ambulatory Visit: Payer: 59 | Admitting: Urology

## 2023-07-05 DIAGNOSIS — R5382 Chronic fatigue, unspecified: Secondary | ICD-10-CM | POA: Diagnosis not present

## 2023-07-05 DIAGNOSIS — M25531 Pain in right wrist: Secondary | ICD-10-CM | POA: Diagnosis not present

## 2023-07-05 DIAGNOSIS — Z681 Body mass index (BMI) 19 or less, adult: Secondary | ICD-10-CM | POA: Diagnosis not present

## 2023-07-05 DIAGNOSIS — M0579 Rheumatoid arthritis with rheumatoid factor of multiple sites without organ or systems involvement: Secondary | ICD-10-CM | POA: Diagnosis not present

## 2023-07-05 DIAGNOSIS — Z79899 Other long term (current) drug therapy: Secondary | ICD-10-CM | POA: Diagnosis not present

## 2023-09-05 DIAGNOSIS — N182 Chronic kidney disease, stage 2 (mild): Secondary | ICD-10-CM | POA: Diagnosis not present

## 2023-09-05 DIAGNOSIS — M1990 Unspecified osteoarthritis, unspecified site: Secondary | ICD-10-CM | POA: Diagnosis not present

## 2023-09-05 DIAGNOSIS — Z791 Long term (current) use of non-steroidal anti-inflammatories (NSAID): Secondary | ICD-10-CM | POA: Diagnosis not present

## 2023-09-05 DIAGNOSIS — E785 Hyperlipidemia, unspecified: Secondary | ICD-10-CM | POA: Diagnosis not present

## 2023-09-05 DIAGNOSIS — D529 Folate deficiency anemia, unspecified: Secondary | ICD-10-CM | POA: Diagnosis not present

## 2023-09-05 DIAGNOSIS — Z5948 Other specified lack of adequate food: Secondary | ICD-10-CM | POA: Diagnosis not present

## 2023-09-05 DIAGNOSIS — F1021 Alcohol dependence, in remission: Secondary | ICD-10-CM | POA: Diagnosis not present

## 2023-09-05 DIAGNOSIS — N400 Enlarged prostate without lower urinary tract symptoms: Secondary | ICD-10-CM | POA: Diagnosis not present

## 2023-09-05 DIAGNOSIS — Z96642 Presence of left artificial hip joint: Secondary | ICD-10-CM | POA: Diagnosis not present

## 2023-09-05 DIAGNOSIS — Z5986 Financial insecurity: Secondary | ICD-10-CM | POA: Diagnosis not present

## 2023-09-05 DIAGNOSIS — Z79631 Long term (current) use of antimetabolite agent: Secondary | ICD-10-CM | POA: Diagnosis not present

## 2023-09-05 DIAGNOSIS — F17210 Nicotine dependence, cigarettes, uncomplicated: Secondary | ICD-10-CM | POA: Diagnosis not present

## 2023-09-15 DIAGNOSIS — M25531 Pain in right wrist: Secondary | ICD-10-CM | POA: Diagnosis not present

## 2023-09-15 DIAGNOSIS — Z79899 Other long term (current) drug therapy: Secondary | ICD-10-CM | POA: Diagnosis not present

## 2023-09-15 DIAGNOSIS — M0579 Rheumatoid arthritis with rheumatoid factor of multiple sites without organ or systems involvement: Secondary | ICD-10-CM | POA: Diagnosis not present

## 2023-09-15 DIAGNOSIS — R5382 Chronic fatigue, unspecified: Secondary | ICD-10-CM | POA: Diagnosis not present

## 2023-09-15 DIAGNOSIS — Z681 Body mass index (BMI) 19 or less, adult: Secondary | ICD-10-CM | POA: Diagnosis not present

## 2023-11-15 DIAGNOSIS — M25531 Pain in right wrist: Secondary | ICD-10-CM | POA: Diagnosis not present

## 2023-11-15 DIAGNOSIS — R5382 Chronic fatigue, unspecified: Secondary | ICD-10-CM | POA: Diagnosis not present

## 2023-11-15 DIAGNOSIS — Z79899 Other long term (current) drug therapy: Secondary | ICD-10-CM | POA: Diagnosis not present

## 2023-11-15 DIAGNOSIS — Z681 Body mass index (BMI) 19 or less, adult: Secondary | ICD-10-CM | POA: Diagnosis not present

## 2023-11-15 DIAGNOSIS — M0579 Rheumatoid arthritis with rheumatoid factor of multiple sites without organ or systems involvement: Secondary | ICD-10-CM | POA: Diagnosis not present

## 2023-12-05 ENCOUNTER — Other Ambulatory Visit: Payer: Self-pay

## 2023-12-05 DIAGNOSIS — R569 Unspecified convulsions: Secondary | ICD-10-CM

## 2023-12-05 MED ORDER — PHENYTOIN SODIUM EXTENDED 100 MG PO CAPS: 300.0000 mg | ORAL_CAPSULE | Freq: Every day | ORAL | 0 refills | Status: DC

## 2023-12-07 ENCOUNTER — Other Ambulatory Visit: Payer: Self-pay

## 2023-12-07 DIAGNOSIS — C61 Malignant neoplasm of prostate: Secondary | ICD-10-CM

## 2023-12-08 ENCOUNTER — Other Ambulatory Visit

## 2023-12-08 DIAGNOSIS — C61 Malignant neoplasm of prostate: Secondary | ICD-10-CM | POA: Diagnosis not present

## 2023-12-09 LAB — PSA: Prostate Specific Ag, Serum: 2.7 ng/mL (ref 0.0–4.0)

## 2023-12-14 ENCOUNTER — Ambulatory Visit: Admitting: Urology

## 2023-12-14 VITALS — BP 124/64 | HR 59

## 2023-12-14 DIAGNOSIS — N401 Enlarged prostate with lower urinary tract symptoms: Secondary | ICD-10-CM | POA: Diagnosis not present

## 2023-12-14 DIAGNOSIS — R350 Frequency of micturition: Secondary | ICD-10-CM

## 2023-12-14 DIAGNOSIS — C61 Malignant neoplasm of prostate: Secondary | ICD-10-CM

## 2023-12-14 LAB — URINALYSIS, ROUTINE W REFLEX MICROSCOPIC
Bilirubin, UA: NEGATIVE
Glucose, UA: NEGATIVE
Ketones, UA: NEGATIVE
Leukocytes,UA: NEGATIVE
Nitrite, UA: NEGATIVE
Protein,UA: NEGATIVE
RBC, UA: NEGATIVE
Specific Gravity, UA: 1.02 (ref 1.005–1.030)
Urobilinogen, Ur: 0.2 mg/dL (ref 0.2–1.0)
pH, UA: 6 (ref 5.0–7.5)

## 2023-12-14 MED ORDER — ALFUZOSIN HCL ER 10 MG PO TB24: 10.0000 mg | ORAL_TABLET | Freq: Every day | ORAL | 11 refills | Status: AC

## 2023-12-14 MED ORDER — FINASTERIDE 5 MG PO TABS: 5.0000 mg | ORAL_TABLET | Freq: Every day | ORAL | 3 refills | Status: AC

## 2023-12-14 NOTE — Progress Notes (Unsigned)
 12/14/2023 11:32 AM   Bill [name] 1960-12-22 [id number]  Referring provider: Nooruddin, Saad, MD [address] [place], [address] [place],  [place] [address]  No chief complaint on file.   HPI: Mr Engram is a 63yo here for followup for prostate cancer and BPH. PSA stable at 2.7. IPSS 11 QOL 2 on uroxatral  10mg  and finasteride . Nocturia 2x. Uirnary frequency every 3-4 hours.    PMH: Past Medical History:  Diagnosis Date   Alcohol abuse    Hx of , stopped in 2000. Had 12 pack for 25 years.    Arthritis    S/P RIGHT TOTAL HIP ARTHROPLASTY 2007 --PT HAS NOTICED PAIN RIGHT LEG--IS TOLD HE NEEDS RT HIP REVISION   Avascular necrosis of bone of hip (HCC) 2007   Right   Chronic chest pain    Normal stress test in 2003   History of tobacco abuse    Pleural plaque    MULTIPLE BILATERAL PLEURAL PLAQUES, which had not changed on CT as of 1/04   Seizure disorder (HCC)    Started at age 65. Last EMR record 05/25/09   Seizures (HCC)    PT THINKS GRAND MAL  - LAST TIME PT THINKS WAS 2017    Surgical History: Past Surgical History:  Procedure Laterality Date   COLONOSCOPY  10/29/2014   Teressa   POLYPECTOMY     Right Hip Replacement  8/07 / 2013   Dr. Ernie    Home Medications:  Allergies as of 12/14/2023   No Known Allergies      Medication List        Accurate as of December 14, 2023 11:32 AM. If you have any questions, ask your nurse or doctor.          alfuzosin  10 MG 24 hr tablet Commonly known as: UROXATRAL  Take 1 tablet (10 mg total) by mouth at bedtime.   atorvastatin  20 MG tablet Commonly known as: LIPITOR TAKE 1 TABLET BY MOUTH EVERY DAY   diclofenac  Sodium 1 % Gel Commonly known as: Voltaren  Apply 2 g topically 4 (four) times daily as needed.   doxycycline  100 MG capsule Commonly known as: VIBRAMYCIN  Take 1 capsule (100 mg total) by mouth every 12 (twelve) hours.   famotidine  40 MG tablet Commonly known as: PEPCID  Take 1 tablet (40 mg total) by mouth  at bedtime.   finasteride  5 MG tablet Commonly known as: PROSCAR  Take 1 tablet (5 mg total) by mouth daily.   folic acid 1 MG tablet Commonly known as: FOLVITE Take 1 mg by mouth daily.   methotrexate 2.5 MG tablet Commonly known as: RHEUMATREX Take 2.5 mg by mouth once a week. 6 tablets at one time   nicotine  polacrilex 2 MG lozenge Commonly known as: Nicotine  Mini Take 1 lozenge (2 mg total) by mouth as needed for smoking cessation.   phenytoin  100 MG ER capsule Commonly known as: DILANTIN  Take 3 capsules (300 mg total) by mouth daily.   VITAMIN D  PO Take by mouth.        Allergies: No Known Allergies  Family History: Family History  Problem Relation Age of Onset   Heart attack Father        66s   Arthritis Mother    Seizures Brother    Colon cancer Neg Hx    Colon polyps Neg Hx    Esophageal cancer Neg Hx    Rectal cancer Neg Hx    Stomach cancer Neg Hx  Social History:  reports that he has been smoking cigarettes. He started smoking about 27 years ago. He has a 10 pack-year smoking history. He has never used smokeless tobacco. He reports that he does not drink alcohol and does not use drugs.  ROS: All other review of systems were reviewed and are negative except what is noted above in HPI  Physical Exam: BP 124/64   Pulse (!) 59   Constitutional:  Alert and oriented, No acute distress. HEENT: [place] AT, moist mucus membranes.  Trachea midline, no masses. Cardiovascular: No clubbing, cyanosis, or edema. Respiratory: Normal respiratory effort, no increased work of breathing. GI: Abdomen is soft, nontender, nondistended, no abdominal masses GU: No CVA tenderness.  Lymph: No cervical or inguinal lymphadenopathy. Skin: No rashes, bruises or suspicious lesions. Neurologic: Grossly intact, no focal deficits, moving all 4 extremities. Psychiatric: Normal mood and affect.  Laboratory Data: Lab Results  Component Value Date   WBC 5.2 03/15/2023   HGB 13.6  03/15/2023   HCT 39.3 03/15/2023   MCV 86 03/15/2023   PLT 289 03/15/2023    Lab Results  Component Value Date   CREATININE 0.89 03/15/2023    No results found for: PSA  No results found for: TESTOSTERONE  No results found for: HGBA1C  Urinalysis    Component Value Date/Time   COLORURINE YELLOW 10/26/2011 1025   APPEARANCEUR Clear 05/02/2023 1558   LABSPEC 1.028 10/26/2011 1025   PHURINE 6.5 10/26/2011 1025   GLUCOSEU Negative 05/02/2023 1558   HGBUR NEGATIVE 10/26/2011 1025   BILIRUBINUR Negative 05/02/2023 1558   KETONESUR NEGATIVE 10/26/2011 1025   PROTEINUR Negative 05/02/2023 1558   PROTEINUR NEGATIVE 10/26/2011 1025   UROBILINOGEN 0.2 05/05/2021 1030   UROBILINOGEN 0.2 10/26/2011 1025   NITRITE Negative 05/02/2023 1558   NITRITE NEGATIVE 10/26/2011 1025   LEUKOCYTESUR Negative 05/02/2023 1558    Lab Results  Component Value Date   LABMICR Comment 05/02/2023   WBCUA 0-5 08/08/2018   LABEPIT None seen 08/08/2018   MUCUS Present 08/08/2018   BACTERIA None seen 08/08/2018    Pertinent Imaging: *** No results found for this or any previous visit.  No results found for this or any previous visit.  No results found for this or any previous visit.  No results found for this or any previous visit.  No results found for this or any previous visit.  No results found for this or any previous visit.  No results found for this or any previous visit.  No results found for this or any previous visit.   Assessment & Plan:    1. Prostate cancer (HCC) (Primary) Followup 6 months with a PSA - Urinalysis, Routine w reflex microscopic  2. Urinary frequency Continue uroxatral  10mg  at bedtime and finasteride  5mg  daily  3. Benign localized prostatic hyperplasia with lower urinary tract symptoms (LUTS) ***   No follow-ups on file.  Belvie Clara, MD  [facility] [place]

## 2023-12-15 ENCOUNTER — Encounter: Payer: Self-pay | Admitting: Urology

## 2023-12-15 NOTE — Patient Instructions (Signed)

## 2023-12-28 ENCOUNTER — Ambulatory Visit: Admitting: Student

## 2024-04-03 ENCOUNTER — Other Ambulatory Visit: Payer: Self-pay | Admitting: *Deleted

## 2024-04-03 DIAGNOSIS — R569 Unspecified convulsions: Secondary | ICD-10-CM

## 2024-04-03 MED ORDER — PHENYTOIN SODIUM EXTENDED 100 MG PO CAPS: 300.0000 mg | ORAL_CAPSULE | Freq: Every day | ORAL | 0 refills | Status: AC

## 2024-04-03 NOTE — Telephone Encounter (Signed)
 Received refill request from pt's pharmacy for phenytoin   Pt's last seen one year ago CMA attempted to contact pt to remind him of appt already scheduled for 04/09/24  No answer, message left on recorder.  Will send refill request to pcp

## 2024-04-09 ENCOUNTER — Ambulatory Visit: Payer: Self-pay | Admitting: Student

## 2024-04-17 ENCOUNTER — Ambulatory Visit: Payer: Self-pay | Admitting: Student

## 2024-06-28 ENCOUNTER — Other Ambulatory Visit

## 2024-07-04 ENCOUNTER — Ambulatory Visit: Admitting: Urology
